# Patient Record
Sex: Female | Born: 1960
Health system: Southern US, Community
[De-identification: ages and names within clinical notes are randomized; demographics above are authoritative.]

## PROBLEM LIST (undated history)

## (undated) DIAGNOSIS — M1990 Unspecified osteoarthritis, unspecified site: Secondary | ICD-10-CM

## (undated) DIAGNOSIS — K219 Gastro-esophageal reflux disease without esophagitis: Secondary | ICD-10-CM

## (undated) DIAGNOSIS — G4730 Sleep apnea, unspecified: Secondary | ICD-10-CM

## (undated) DIAGNOSIS — F32A Depression, unspecified: Secondary | ICD-10-CM

## (undated) DIAGNOSIS — I10 Essential (primary) hypertension: Secondary | ICD-10-CM

## (undated) DIAGNOSIS — Z9889 Other specified postprocedural states: Secondary | ICD-10-CM

## (undated) DIAGNOSIS — R112 Nausea with vomiting, unspecified: Secondary | ICD-10-CM

## (undated) DIAGNOSIS — F329 Major depressive disorder, single episode, unspecified: Secondary | ICD-10-CM

## (undated) DIAGNOSIS — F319 Bipolar disorder, unspecified: Secondary | ICD-10-CM

## (undated) HISTORY — PX: TUBAL LIGATION: SHX77

---

## 2000-04-18 ENCOUNTER — Other Ambulatory Visit: Admission: RE | Admit: 2000-04-18 | Discharge: 2000-04-18 | Payer: Self-pay | Admitting: Internal Medicine

## 2001-04-22 ENCOUNTER — Other Ambulatory Visit: Admission: RE | Admit: 2001-04-22 | Discharge: 2001-04-22 | Payer: Self-pay | Admitting: Internal Medicine

## 2006-09-27 ENCOUNTER — Other Ambulatory Visit: Admission: RE | Admit: 2006-09-27 | Discharge: 2006-09-27 | Payer: Self-pay | Admitting: Internal Medicine

## 2006-10-02 ENCOUNTER — Encounter: Admission: RE | Admit: 2006-10-02 | Discharge: 2006-10-02 | Payer: Self-pay | Admitting: Internal Medicine

## 2006-10-09 ENCOUNTER — Encounter: Admission: RE | Admit: 2006-10-09 | Discharge: 2006-10-09 | Payer: Self-pay | Admitting: Internal Medicine

## 2007-04-05 ENCOUNTER — Encounter: Admission: RE | Admit: 2007-04-05 | Discharge: 2007-04-05 | Payer: Self-pay | Admitting: Internal Medicine

## 2007-10-07 ENCOUNTER — Encounter: Admission: RE | Admit: 2007-10-07 | Discharge: 2007-10-07 | Payer: Self-pay | Admitting: Internal Medicine

## 2007-11-26 ENCOUNTER — Other Ambulatory Visit: Admission: RE | Admit: 2007-11-26 | Discharge: 2007-11-26 | Payer: Self-pay | Admitting: Internal Medicine

## 2011-09-10 ENCOUNTER — Emergency Department (HOSPITAL_COMMUNITY)
Admission: EM | Admit: 2011-09-10 | Discharge: 2011-09-10 | Disposition: A | Discharge status: Home / Self Care | Payer: PRIVATE HEALTH INSURANCE | Attending: Emergency Medicine | Admitting: Emergency Medicine

## 2011-09-10 ENCOUNTER — Encounter (HOSPITAL_COMMUNITY): Payer: Self-pay | Admitting: *Deleted

## 2011-09-10 DIAGNOSIS — F329 Major depressive disorder, single episode, unspecified: Secondary | ICD-10-CM | POA: Insufficient documentation

## 2011-09-10 DIAGNOSIS — Z046 Encounter for general psychiatric examination, requested by authority: Secondary | ICD-10-CM | POA: Insufficient documentation

## 2011-09-10 DIAGNOSIS — F3289 Other specified depressive episodes: Secondary | ICD-10-CM | POA: Insufficient documentation

## 2011-09-10 DIAGNOSIS — I10 Essential (primary) hypertension: Secondary | ICD-10-CM | POA: Insufficient documentation

## 2011-09-10 DIAGNOSIS — F32A Depression, unspecified: Secondary | ICD-10-CM

## 2011-09-10 HISTORY — DX: Essential (primary) hypertension: I10

## 2011-09-10 HISTORY — DX: Depression, unspecified: F32.A

## 2011-09-10 HISTORY — DX: Major depressive disorder, single episode, unspecified: F32.9

## 2011-09-10 LAB — COMPREHENSIVE METABOLIC PANEL
ALT: 17 U/L (ref 0–35)
Albumin: 3.9 g/dL (ref 3.5–5.2)
Calcium: 9.7 mg/dL (ref 8.4–10.5)
Chloride: 100 mEq/L (ref 96–112)
Creatinine, Ser: 0.83 mg/dL (ref 0.50–1.10)
GFR calc Af Amer: >90 mL/min (ref >90)
Glucose, Bld: 112 mg/dL — ABNORMAL HIGH (ref 70–99)
Potassium: 4 mEq/L (ref 3.5–5.1)
Sodium: 135 mEq/L (ref 135–145)
Total Bilirubin: 0.1 mg/dL — ABNORMAL LOW (ref 0.3–1.2)
Total Protein: 7.3 g/dL (ref 6.0–8.3)

## 2011-09-10 LAB — RAPID URINE DRUG SCREEN, HOSP PERFORMED
Benzodiazepines: POSITIVE — AB
Cocaine: NOT DETECTED

## 2011-09-10 LAB — CBC
MCHC: 33.6 g/dL (ref 30.0–36.0)
Platelets: 324 10*3/uL (ref 150–400)

## 2011-09-10 LAB — PREGNANCY, URINE: Preg Test, Ur: NEGATIVE

## 2011-09-10 MED ORDER — METOPROLOL SUCCINATE ER 100 MG PO TB24
100.0000 mg | ORAL_TABLET | Freq: Every day | ORAL | Status: DC
  Filled 2011-09-10: qty 1

## 2011-09-10 MED ORDER — HYDROCHLOROTHIAZIDE 25 MG PO TABS
12.5000 mg | ORAL_TABLET | Freq: Every day | ORAL | Status: DC
  Filled 2011-09-10: qty 0.5

## 2011-09-10 MED ORDER — CARBAMAZEPINE ER 200 MG PO CP12
800.0000 mg | ORAL_CAPSULE | Freq: Every day | ORAL | Status: DC
  Filled 2011-09-10 (×2): qty 4

## 2011-09-10 MED ORDER — ACETAMINOPHEN 325 MG PO TABS: 650.0000 mg | ORAL_TABLET | ORAL | Status: DC | PRN

## 2011-09-10 MED ORDER — ATORVASTATIN CALCIUM 40 MG PO TABS
40.0000 mg | ORAL_TABLET | Freq: Every day | ORAL | Status: DC
  Filled 2011-09-10: qty 1

## 2011-09-10 MED ORDER — BUPROPION HCL ER (XL) 150 MG PO TB24
150.0000 mg | ORAL_TABLET | Freq: Every day | ORAL | Status: DC
  Filled 2011-09-10: qty 1

## 2011-09-10 MED ORDER — LAMOTRIGINE 100 MG PO TABS
100.0000 mg | ORAL_TABLET | Freq: Every day | ORAL | Status: DC
  Filled 2011-09-10: qty 1

## 2011-09-10 MED ORDER — BUPROPION HCL 100 MG PO TABS
100.0000 mg | ORAL_TABLET | Freq: Three times a day (TID) | ORAL | Status: DC
  Filled 2011-09-10 (×3): qty 1

## 2011-09-10 MED ORDER — LITHIUM CARBONATE 300 MG PO CAPS: 300.0000 mg | ORAL_CAPSULE | Freq: Three times a day (TID) | ORAL | Status: DC

## 2011-09-10 MED ORDER — BUPROPION HCL ER (SMOKING DET) 150 MG PO TB12: 150.0000 mg | ORAL_TABLET | Freq: Every morning | ORAL | Status: DC

## 2011-09-10 MED ORDER — LORAZEPAM 1 MG PO TABS: 1.0000 mg | ORAL_TABLET | Freq: Three times a day (TID) | ORAL | Status: DC | PRN

## 2011-09-10 MED ORDER — OLANZAPINE-FLUOXETINE HCL 12-25 MG PO CAPS
1.0000 | ORAL_CAPSULE | Freq: Every evening | ORAL | Status: DC
  Filled 2011-09-10 (×2): qty 1

## 2011-09-10 MED ORDER — IBUPROFEN 200 MG PO TABS
600.0000 mg | ORAL_TABLET | Freq: Three times a day (TID) | ORAL | Status: DC | PRN
  Administered 2011-09-10 (×2): 600 mg via ORAL
  Filled 2011-09-10 (×2): qty 3

## 2011-09-10 MED ORDER — LITHIUM CARBONATE ER 450 MG PO TBCR
450.0000 mg | EXTENDED_RELEASE_TABLET | Freq: Every day | ORAL | Status: DC
  Filled 2011-09-10 (×2): qty 1

## 2011-09-10 MED ORDER — ALUM & MAG HYDROXIDE-SIMETH 200-200-20 MG/5ML PO SUSP: 30.0000 mL | ORAL | Status: DC | PRN

## 2011-09-10 MED ORDER — LAMOTRIGINE 200 MG PO TABS
200.0000 mg | ORAL_TABLET | Freq: Every day | ORAL | Status: DC
  Filled 2011-09-10: qty 1

## 2011-09-10 MED ORDER — ZOLPIDEM TARTRATE 5 MG PO TABS: 5.0000 mg | ORAL_TABLET | Freq: Every evening | ORAL | Status: DC | PRN

## 2011-09-10 MED ORDER — ALPRAZOLAM 0.25 MG PO TABS
0.2500 mg | ORAL_TABLET | Freq: Three times a day (TID) | ORAL | Status: DC
  Administered 2011-09-10: 0.25 mg via ORAL
  Filled 2011-09-10: qty 1

## 2011-09-10 MED ORDER — ALPRAZOLAM 1 MG PO TABS
1.0000 mg | ORAL_TABLET | Freq: Every day | ORAL | Status: DC
  Administered 2011-09-10 (×2): 1 mg via ORAL
  Filled 2011-09-10 (×2): qty 1

## 2011-09-10 MED ORDER — LAMOTRIGINE 100 MG PO TABS: 100.0000 mg | ORAL_TABLET | Freq: Two times a day (BID) | ORAL | Status: DC

## 2011-09-10 MED ORDER — METOPROLOL SUCCINATE ER 25 MG PO TB24
25.0000 mg | ORAL_TABLET | Freq: Every day | ORAL | Status: DC
  Filled 2011-09-10: qty 1

## 2011-09-10 MED ORDER — ONDANSETRON HCL 4 MG PO TABS: 4.0000 mg | ORAL_TABLET | Freq: Three times a day (TID) | ORAL | Status: DC | PRN

## 2011-09-10 NOTE — ED Notes (Signed)
info faxed to Tele Pyche-phone call made to tele psyche

## 2011-09-10 NOTE — ED Notes (Signed)
Tele Psyche completed
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
## 2011-09-10 NOTE — ED Notes (Signed)
Per pt husband. Pt has had issues with depression for a few weeks. Pt medications have recently be changed several times in the past few weeks. Lithium has been added as of yesterday.  Pt has been continuously crying this AM.  Pt sees MD Evelene Croon. Pt reports SI and denies HI. Pt has been through [facility] in 1992.

## 2011-09-10 NOTE — ED Notes (Signed)
Discharged home in no distress. Denies pain. VSS. Husband here to transport home. Dr. Felix Pacini assessed prior to discharge.

## 2011-09-10 NOTE — ED Provider Notes (Addendum)
History     CSN: [id number]  Arrival date & time 09/10/11  2130   First MD Initiated Contact with Patient 09/10/11 [phone]      Chief Complaint  Patient presents with  . V70.1    (Consider location/radiation/quality/duration/timing/severity/associated sxs/prior treatment) HPI Comments: Patient presents with a history of bipolar disorder and depression.  She previously been well controlled on her medication regimen until a few months ago."  (Having increased problems with concentrating at work to the point that even with medication changes her doctor put her on disability.  Since that time patient has been at home and her depression has been worsening.  The husband notes that specifically over the last 3-4 weeks she's become tearful and cries every day.  She's afraid to be alone in the house.  She notes that there is some exacerbation because her youngest son had recently moved out.  She is now beginning of suicidal thoughts but has no specific plans.  She's not become violent or aggressive.  They have been actively in contact with her psychiatrist who has been adjusting her medications over the last few weeks.  They had done a trial of Seroquel without significant changes the patient had only worsened.  Just yesterday the patient was started on lithium and this is a new medication for her.  This morning he got up to go to church the patient cannot stop crying and started having feelings that she might want to end things and her husband brought her here.  Patient is a 51 y.o. female presenting with mental health disorder. The history is provided by the patient and the spouse. No language interpreter was used.  Mental Health Problem The primary symptoms include dysphoric mood. The primary symptoms do not include delusions, hallucinations, bizarre behavior or disorganized speech. The current episode started more than 2 weeks ago. This is a chronic problem.  The degree of incapacity that she is  experiencing as a consequence of her illness is severe. Sequelae of the illness include an inability to work. Additional symptoms of the illness include anhedonia. Additional symptoms of the illness do not include no headaches or no abdominal pain.    Past Medical History  Diagnosis Date  . Hypertension   . Depression     Past Surgical History  Procedure Date  . Tubal ligation     History reviewed. No pertinent family history.  History  Substance Use Topics  . Smoking status: Never Smoker   . Smokeless tobacco: Not on file  . Alcohol Use: No    OB History    Grav Para Term Preterm Abortions TAB SAB Ect Mult Living                  Review of Systems  Constitutional: Negative.  Negative for fever and chills.  HENT: Negative.   Eyes: Negative.  Negative for discharge and redness.  Respiratory: Negative.  Negative for cough and shortness of breath.   Cardiovascular: Negative.  Negative for chest pain.  Gastrointestinal: Negative.  Negative for nausea, vomiting, abdominal pain and diarrhea.  Genitourinary: Negative.  Negative for dysuria and vaginal discharge.  Musculoskeletal: Negative.  Negative for back pain.  Skin: Negative.  Negative for color change and rash.  Neurological: Negative.  Negative for syncope and headaches.  Hematological: Negative.  Negative for adenopathy.  Psychiatric/Behavioral: Positive for dysphoric mood. Negative for hallucinations and confusion.  All other systems reviewed and are negative.    Allergies  Review of patient's allergies  indicates no known allergies.  Home Medications   Current Outpatient Rx  Name Route Sig Dispense Refill  . ALPRAZOLAM 0.25 MG PO TABS Oral Take 0.25 mg by mouth 3 (three) times daily. Takes 1 tablet every morning 2 at bedtime    . BUPROPION HCL 100 MG PO TABS Oral Take 100 mg by mouth 3 (three) times daily.    Marland Kitchen HYDROCHLOROTHIAZIDE 25 MG PO TABS Oral Take 12.5 mg by mouth daily.    Marland Kitchen LAMOTRIGINE 200 MG PO TABS  Oral Take 100-200 mg by mouth 2 (two) times daily. 1/2 tablet every morning and 1 tablet every evening    . LITHIUM CARBONATE 300 MG PO CAPS Oral Take 300 mg by mouth 3 (three) times daily with meals.    Marland Kitchen METOPROLOL SUCCINATE ER 100 MG PO TB24 Oral Take 100 mg by mouth daily. Take with or immediately following a meal.      BP 126/95  Pulse 98  Temp(Src) 98 F (36.7 C) (Oral)  Resp 20  SpO2 99%  LMP 09/08/2011  Physical Exam  Nursing note and vitals reviewed. Constitutional: She is oriented to person, place, and time. She appears well-developed and well-nourished.  Non-toxic appearance. She does not have a sickly appearance.  HENT:  Head: Normocephalic and atraumatic.  Eyes: Conjunctivae, EOM and lids are normal. Pupils are equal, round, and reactive to light. No scleral icterus.  Neck: Trachea normal and normal range of motion. Neck supple.  Cardiovascular: Normal rate, regular rhythm and normal heart sounds.   Pulmonary/Chest: Effort normal and breath sounds normal. No respiratory distress. She has no wheezes. She has no rales.  Abdominal: Soft. Normal appearance. There is no tenderness. There is no rebound, no guarding and no CVA tenderness.  Musculoskeletal: Normal range of motion.  Neurological: She is alert and oriented to person, place, and time. She has normal strength.  Skin: Skin is warm, dry and intact. No rash noted.  Psychiatric: Her behavior is normal. Judgment and thought content normal.       Tearful, depressed mood/affect    ED Course  Procedures (including critical care time)  Results for orders placed during the hospital encounter of 09/10/11  CBC      Component Value Range   WBC 9.6  4.0 - 10.5 (K/uL)   RBC 4.83  3.87 - 5.11 (MIL/uL)   Hemoglobin 13.5  12.0 - 15.0 (g/dL)   HCT 16.1  09.6 - 04.5 (%)   MCV 83.2  78.0 - 100.0 (fL)   MCH 28.0  26.0 - 34.0 (pg)   MCHC 33.6  30.0 - 36.0 (g/dL)   RDW 40.9  81.1 - 91.4 (%)   Platelets 324  150 - 400 (K/uL)    COMPREHENSIVE METABOLIC PANEL      Component Value Range   Sodium 135  135 - 145 (mEq/L)   Potassium 4.0  3.5 - 5.1 (mEq/L)   Chloride 100  96 - 112 (mEq/L)   CO2 22  19 - 32 (mEq/L)   Glucose, Bld 112 (*) 70 - 99 (mg/dL)   BUN 18  6 - 23 (mg/dL)   Creatinine, Ser 7.82  0.50 - 1.10 (mg/dL)   Calcium 9.7  8.4 - 95.6 (mg/dL)   Total Protein 7.3  6.0 - 8.3 (g/dL)   Albumin 3.9  3.5 - 5.2 (g/dL)   AST 17  0 - 37 (U/L)   ALT 17  0 - 35 (U/L)   Alkaline Phosphatase 101  39 - 117 (  U/L)   Total Bilirubin 0.1 (*) 0.3 - 1.2 (mg/dL)   GFR calc non Af Amer 81 (*) >90 (mL/min)   GFR calc Af Amer >90  >90 (mL/min)  ETHANOL      Component Value Range   Alcohol, Ethyl (B) <11  0 - 11 (mg/dL)  URINE RAPID DRUG SCREEN (HOSP PERFORMED)      Component Value Range   Opiates NONE DETECTED  NONE DETECTED    Cocaine NONE DETECTED  NONE DETECTED    Benzodiazepines POSITIVE (*) NONE DETECTED    Amphetamines NONE DETECTED  NONE DETECTED    Tetrahydrocannabinol NONE DETECTED  NONE DETECTED    Barbiturates NONE DETECTED  NONE DETECTED   PREGNANCY, URINE      Component Value Range   Preg Test, Ur NEGATIVE  NEGATIVE   LITHIUM LEVEL      Component Value Range   Lithium Lvl <0.25 (*) 0.80 - 1.40 (mEq/L)      MDM  Patient with significant depression symptoms that are worsening and will likely require inpatient admission for better control of her symptoms as she starting to have suicidal thoughts but no specific plans.  I will contact the ACT team for further evaluation of this patient.        Nat Christen, MD 09/10/11 [phone]  Labs are all normal.  Low lithium expected since pt just started it yesterday.  Pt medically cleared for psych eval.    Nat Christen, MD 09/10/11 1003

## 2011-09-10 NOTE — ED Provider Notes (Signed)
Patient interviewed by specialist on call psychiatrist Dr.Horvath who feels the patient is stable from a psychiatric standpoint and does not require psychiatric admission. At 9 PM I interview patient. She is alert pleasant cooperative vehemently denies want to harm self or others. Plan she is to followup with her private psychiatrist and continue her current medication regimen  Doug Sou, MD 09/10/11 2115

## 2011-09-10 NOTE — Discharge Instructions (Signed)
Depression You have signs of depression. This is a common problem. It can occur at any age. It is often hard to recognize. People can suffer from depression and still have moments of enjoyment. Depression interferes with your basic ability to function in life. It upsets your relationships, sleep, eating, and work habits. CAUSES  Depression is believed to be caused by an imbalance in brain chemicals. It may be triggered by an unpleasant event. Relationship crises, a death in the family, financial worries, retirement, or other stressors are normal causes of depression. Depression may also start for no known reason. Other factors that may play a part include medical illnesses, some medicines, genetics, and alcohol or drug abuse. SYMPTOMS   Feeling unhappy or worthless.   Long-lasting (chronic) tiredness or worn-out feeling.   Self-destructive thoughts and actions.   Not being able to sleep or sleeping too much.   Eating more than usual or not eating at all.   Headaches or feeling anxious.   Trouble concentrating or making decisions.   Unexplained physical problems and substance abuse.  TREATMENT  Depression usually gets better with treatment. This can include:  Antidepressant medicines. It can take weeks before the proper dose is achieved and benefits are reached.   Talking with a therapist, clergyperson, counselor, or friend. These people can help you gain insight into your problem and regain control of your life.   Eating a good diet.   Getting regular physical exercise, such as walking for 30 minutes every day.   Not abusing alcohol or drugs.  Treating depression often takes 6 months or longer. This length of treatment is needed to keep symptoms from returning. Call your caregiver and arrange for follow-up care as suggested. SEEK IMMEDIATE MEDICAL CARE IF:   You start to have thoughts of hurting yourself or others.   Call your local emergency services (911 in [place]   Go to  your local medical emergency department.   Call the National Suicide Prevention Lifeline: 1-800-273-TALK 3403318701).  Document Released: 05/29/2005 Document Revised: 05/18/2011 Document Reviewed: 10/29/2009 [facility] Patient Information 2012 [place], [place].   Continue your current medications. If you have any thought of harming herself or others call 911 immediately. Call your psychiatrist tomorrow for the next available appointment

## 2014-06-12 HISTORY — PX: CERVICAL BIOPSY  W/ LOOP ELECTRODE EXCISION: SUR135

## 2016-07-05 DIAGNOSIS — E7800 Pure hypercholesterolemia, unspecified: Secondary | ICD-10-CM | POA: Diagnosis not present

## 2016-07-05 DIAGNOSIS — Z79899 Other long term (current) drug therapy: Secondary | ICD-10-CM | POA: Diagnosis not present

## 2016-07-11 DIAGNOSIS — F319 Bipolar disorder, unspecified: Secondary | ICD-10-CM | POA: Diagnosis not present

## 2016-07-11 DIAGNOSIS — E7800 Pure hypercholesterolemia, unspecified: Secondary | ICD-10-CM | POA: Diagnosis not present

## 2016-07-11 DIAGNOSIS — Z79899 Other long term (current) drug therapy: Secondary | ICD-10-CM | POA: Diagnosis not present

## 2016-07-11 DIAGNOSIS — I10 Essential (primary) hypertension: Secondary | ICD-10-CM | POA: Diagnosis not present

## 2016-11-13 DIAGNOSIS — L2084 Intrinsic (allergic) eczema: Secondary | ICD-10-CM | POA: Diagnosis not present

## 2016-11-13 DIAGNOSIS — L719 Rosacea, unspecified: Secondary | ICD-10-CM | POA: Diagnosis not present

## 2016-12-22 DIAGNOSIS — M7062 Trochanteric bursitis, left hip: Secondary | ICD-10-CM | POA: Diagnosis not present

## 2017-01-03 DIAGNOSIS — E7800 Pure hypercholesterolemia, unspecified: Secondary | ICD-10-CM | POA: Diagnosis not present

## 2017-01-03 DIAGNOSIS — Z79899 Other long term (current) drug therapy: Secondary | ICD-10-CM | POA: Diagnosis not present

## 2017-01-09 DIAGNOSIS — I10 Essential (primary) hypertension: Secondary | ICD-10-CM | POA: Diagnosis not present

## 2017-01-09 DIAGNOSIS — F319 Bipolar disorder, unspecified: Secondary | ICD-10-CM | POA: Diagnosis not present

## 2017-01-09 DIAGNOSIS — E7800 Pure hypercholesterolemia, unspecified: Secondary | ICD-10-CM | POA: Diagnosis not present

## 2017-01-09 DIAGNOSIS — Z79899 Other long term (current) drug therapy: Secondary | ICD-10-CM | POA: Diagnosis not present

## 2017-01-24 DIAGNOSIS — J069 Acute upper respiratory infection, unspecified: Secondary | ICD-10-CM | POA: Diagnosis not present

## 2017-01-30 DIAGNOSIS — Z23 Encounter for immunization: Secondary | ICD-10-CM | POA: Diagnosis not present

## 2017-05-02 ENCOUNTER — Ambulatory Visit: Payer: PRIVATE HEALTH INSURANCE | Admitting: Primary Care

## 2017-05-08 ENCOUNTER — Ambulatory Visit (INDEPENDENT_AMBULATORY_CARE_PROVIDER_SITE_OTHER): Payer: Medicare Other | Admitting: Primary Care

## 2017-05-08 ENCOUNTER — Encounter: Payer: Self-pay | Admitting: Primary Care

## 2017-05-08 VITALS — BP 118/78 | HR 78 | Temp 97.9°F | Ht 63.0 in | Wt 226.4 lb

## 2017-05-08 DIAGNOSIS — F319 Bipolar disorder, unspecified: Secondary | ICD-10-CM | POA: Insufficient documentation

## 2017-05-08 DIAGNOSIS — F3178 Bipolar disorder, in full remission, most recent episode mixed: Secondary | ICD-10-CM

## 2017-05-08 DIAGNOSIS — I10 Essential (primary) hypertension: Secondary | ICD-10-CM | POA: Diagnosis not present

## 2017-05-08 DIAGNOSIS — L719 Rosacea, unspecified: Secondary | ICD-10-CM

## 2017-05-08 DIAGNOSIS — E785 Hyperlipidemia, unspecified: Secondary | ICD-10-CM

## 2017-05-08 LAB — BASIC METABOLIC PANEL
BUN: 24 mg/dL — AB (ref 6–23)
CHLORIDE: 100 meq/L (ref 96–112)
CO2: 29 mEq/L (ref 19–32)
CREATININE: 0.95 mg/dL (ref 0.40–1.20)
Calcium: 9.4 mg/dL (ref 8.4–10.5)
GFR: 64.59 mL/min (ref >60.00)
Glucose, Bld: 91 mg/dL (ref 70–99)
POTASSIUM: 4 meq/L (ref 3.5–5.1)
Sodium: 138 mEq/L (ref 135–145)

## 2017-05-08 LAB — LIPID PANEL
Cholesterol: 182 mg/dL (ref 0–200)
HDL: 56.6 mg/dL (ref >39.00)
LDL CALC: 95 mg/dL (ref 0–99)
NONHDL: 125.68
Total CHOL/HDL Ratio: 3
Triglycerides: 153 mg/dL — ABNORMAL HIGH (ref 0.0–149.0)
VLDL: 30.6 mg/dL (ref 0.0–40.0)

## 2017-05-08 NOTE — Progress Notes (Signed)
Subjective:    Patient ID: Katherine Carpenter, female    DOB: 09-07-1960, 56 y.o.   MRN: [id number]  HPI  Katherine Carpenter is a 56 year old female who presents today to establish care and discuss the problems mentioned below. Will obtain old records. She follows with her gynecologist every year for her physical.   1) Bipolar Disorder/ADD: Currently managed on Wellbutrin XL 150 mg, carbamazepine, lamotrigine 200 mg, olanzapine-fluoxetine 12-25 mg, quetiapine XR 200 mg, alprazolam PRN, Adderall XR 20 mg. She is currently following with Dr. Toy Care with psychiatry, follows every 6-9 months.  2) Essential Hypertension: Currently managed on HCTZ 25 mg and metoprolol succinate 25 mg. She was once removed from HCTZ one year ago but she experienced extremity edema so her HCTZ was resumed.   BP Readings from Last 3 Encounters:  05/08/17 118/78  09/10/11 142/94    3) Hyperlipidemia: Currently managed on simvastatin 80 mg. Lipid panel in July 2018 with TC of 188, LDL of 99, Trigs of 138.   4) Rosacea/Eczema: Currently managed on metronidazole 0.75% cream daily and doxycycline 100 mg once daily. Currently following with dermatology once annually.   Review of Systems  Eyes: Negative for visual disturbance.  Respiratory: Negative for shortness of breath.   Cardiovascular: Negative for chest pain.  Skin:       Chronic rosacea, follows with dermatology  Neurological: Negative for dizziness and headaches.  Psychiatric/Behavioral:       Following with psychiatry       Past Medical History:  Diagnosis Date  . Depression   . Hypertension      Social History   Socioeconomic History  . Marital status: Married    Spouse name: Not on file  . Number of children: Not on file  . Years of education: Not on file  . Highest education level: Not on file  Social Needs  . Financial resource strain: Not on file  . Food insecurity - worry: Not on file  . Food insecurity - inability: Not on file  .  Transportation needs - medical: Not on file  . Transportation needs - non-medical: Not on file  Occupational History  . Not on file  Tobacco Use  . Smoking status: Never Smoker  . Smokeless tobacco: Never Used  Substance and Sexual Activity  . Alcohol use: No    Comment: Pt denies  . Drug use: No    Comment: Pt denies  . Sexual activity: Not on file  Other Topics Concern  . Not on file  Social History Narrative  . Not on file    Past Surgical History:  Procedure Laterality Date  . TUBAL LIGATION      Family History  Problem Relation Age of Onset  . Asthma Mother   . Depression Daughter   . Arthritis Maternal Grandmother   . Cancer Maternal Grandmother        lung  . Cancer Paternal Grandmother   . Leukemia Paternal Grandmother     No Known Allergies  Current Outpatient Medications on File Prior to Visit  Medication Sig Dispense Refill  . ALPRAZolam (XANAX) 1 MG tablet Take 1 mg by mouth 5 (five) times daily. Patient may take up to 5 times a day    . amphetamine-dextroamphetamine (ADDERALL XR) 20 MG 24 hr capsule Take 20 mg by mouth daily.    Marland Kitchen buPROPion (WELLBUTRIN XL) 150 MG 24 hr tablet Take 150 mg by mouth daily.    . carbamazepine (EQUETRO) 200  MG CP12 Take 800 mg by mouth at bedtime.    Marland Kitchen doxycycline (VIBRAMYCIN) 100 MG capsule Take 100 mg by mouth daily.    . fluticasone (FLONASE) 50 MCG/ACT nasal spray Place into the nose.    . hydrochlorothiazide (HYDRODIURIL) 25 MG tablet Take 12.5 mg by mouth daily.    Marland Kitchen lamoTRIgine (LAMICTAL) 200 MG tablet Take 100-200 mg by mouth 2 (two) times daily. 1/2 tablet every morning and 1 tablet every evening    . metoprolol succinate (TOPROL-XL) 25 MG 24 hr tablet Take 25 mg by mouth daily.    Marland Kitchen olanzapine-FLUoxetine (SYMBYAX) 12-25 MG per capsule Take 1 capsule by mouth every evening.    Marland Kitchen QUEtiapine (SEROQUEL XR) 200 MG 24 hr tablet Take 1,000 mg by mouth at bedtime. Patient takes 5 tablets by mouth every night at bedtime    .  simvastatin (ZOCOR) 80 MG tablet Take 80 mg by mouth daily.    . Calcium Carbonate-Vitamin D (OYSTER SHELL CALCIUM 500 + D) 500-125 MG-UNIT TABS Take by mouth.    . metroNIDAZOLE (METROCREAM) 0.75 % cream Apply topically.    . Multiple Vitamin (MULTI-VITAMINS) TABS Take by mouth.     No current facility-administered medications on file prior to visit.     BP 118/78   Pulse 78   Temp 97.9 F (36.6 C) (Oral)   Ht 5\' 3"  (1.6 m)   Wt 226 lb 6.4 oz (102.7 kg)   SpO2 95%   BMI 40.10 kg/m    Objective:   Physical Exam  Constitutional: She appears well-nourished.  Neck: Neck supple.  Cardiovascular: Normal rate and regular rhythm.  Pulmonary/Chest: Effort normal and breath sounds normal.  Skin: Skin is warm and dry.  Psychiatric: She has a normal mood and affect.          Assessment & Plan:

## 2017-05-08 NOTE — Assessment & Plan Note (Signed)
Chronic, following with dermatology.

## 2017-05-08 NOTE — Patient Instructions (Signed)
Complete lab work prior to leaving today. I will notify you of your results once received.   Please message me when you are needing refills of your medications.   Follow up in 1 year or sooner if needed.  It was a pleasure to meet you today! Please don't hesitate to call me with any questions. Welcome to [facility]!

## 2017-05-08 NOTE — Assessment & Plan Note (Signed)
Repeat lipids pending, continue simvastatin. Discussed the importance of a healthy diet and regular exercise in order for weight loss, and to reduce the risk of other medical problems.

## 2017-05-08 NOTE — Assessment & Plan Note (Signed)
Stable in the office today, continue HCTZ and Toprol XL. BMP pending.

## 2017-05-08 NOTE — Assessment & Plan Note (Signed)
Following with psychiatry, feels well managed. 

## 2017-08-02 DIAGNOSIS — H60501 Unspecified acute noninfective otitis externa, right ear: Secondary | ICD-10-CM | POA: Diagnosis not present

## 2017-08-22 DIAGNOSIS — L2084 Intrinsic (allergic) eczema: Secondary | ICD-10-CM | POA: Diagnosis not present

## 2017-09-07 DIAGNOSIS — Z6836 Body mass index (BMI) 36.0-36.9, adult: Secondary | ICD-10-CM | POA: Diagnosis not present

## 2017-09-07 DIAGNOSIS — Z124 Encounter for screening for malignant neoplasm of cervix: Secondary | ICD-10-CM | POA: Diagnosis not present

## 2017-09-07 DIAGNOSIS — N958 Other specified menopausal and perimenopausal disorders: Secondary | ICD-10-CM | POA: Diagnosis not present

## 2017-09-07 DIAGNOSIS — Z1231 Encounter for screening mammogram for malignant neoplasm of breast: Secondary | ICD-10-CM | POA: Diagnosis not present

## 2017-09-10 LAB — HM PAP SMEAR: HM Pap smear: NEGATIVE

## 2017-10-08 DIAGNOSIS — F3113 Bipolar disorder, current episode manic without psychotic features, severe: Secondary | ICD-10-CM | POA: Diagnosis not present

## 2017-10-14 ENCOUNTER — Encounter: Payer: Self-pay | Admitting: Primary Care

## 2017-10-14 DIAGNOSIS — I10 Essential (primary) hypertension: Secondary | ICD-10-CM

## 2017-10-15 MED ORDER — METOPROLOL SUCCINATE ER 25 MG PO TB24: 25.0000 mg | ORAL_TABLET | Freq: Every day | ORAL | 1 refills | Status: DC

## 2017-10-15 NOTE — Telephone Encounter (Signed)
Ok to refill? MyChart refill request. Medication have not been prescribed by Anda Kraft. Last seen on 05/08/2017

## 2017-10-22 DIAGNOSIS — F3113 Bipolar disorder, current episode manic without psychotic features, severe: Secondary | ICD-10-CM | POA: Diagnosis not present

## 2017-10-23 ENCOUNTER — Encounter: Payer: Self-pay | Admitting: Primary Care

## 2017-10-23 DIAGNOSIS — R82998 Other abnormal findings in urine: Secondary | ICD-10-CM | POA: Diagnosis not present

## 2017-10-23 DIAGNOSIS — N390 Urinary tract infection, site not specified: Secondary | ICD-10-CM | POA: Diagnosis not present

## 2017-10-23 DIAGNOSIS — N760 Acute vaginitis: Secondary | ICD-10-CM | POA: Diagnosis not present

## 2017-10-23 DIAGNOSIS — R350 Frequency of micturition: Secondary | ICD-10-CM | POA: Diagnosis not present

## 2017-11-12 DIAGNOSIS — F3113 Bipolar disorder, current episode manic without psychotic features, severe: Secondary | ICD-10-CM | POA: Diagnosis not present

## 2017-11-27 ENCOUNTER — Encounter: Payer: Self-pay | Admitting: Primary Care

## 2017-11-27 DIAGNOSIS — F3113 Bipolar disorder, current episode manic without psychotic features, severe: Secondary | ICD-10-CM | POA: Diagnosis not present

## 2017-11-27 DIAGNOSIS — Z23 Encounter for immunization: Secondary | ICD-10-CM

## 2017-11-27 MED ORDER — ZOSTER VAC RECOMB ADJUVANTED 50 MCG/0.5ML IM SUSR: 0.5000 mL | Freq: Once | INTRAMUSCULAR | 1 refills | Status: AC

## 2017-11-30 ENCOUNTER — Encounter: Payer: Self-pay | Admitting: Primary Care

## 2017-12-03 ENCOUNTER — Encounter: Payer: Self-pay | Admitting: Primary Care

## 2017-12-03 ENCOUNTER — Telehealth: Payer: Self-pay | Admitting: Primary Care

## 2017-12-03 MED ORDER — SIMVASTATIN 80 MG PO TABS: 80.0000 mg | ORAL_TABLET | Freq: Every day | ORAL | 1 refills | Status: DC

## 2017-12-03 NOTE — Telephone Encounter (Signed)
Caller name: Vicente Males  Relation to pt: pharmacy tech  Call back number: [phone]  Pharmacy: [id number] - [place], [place] [address] [phone] (Phone) [phone] (Fax)   Reason for call:  Pharmacy wanted to confirm the "high dose increase" for simvastatin (ZOCOR) 80 MG tablet,  please advise

## 2017-12-03 NOTE — Telephone Encounter (Signed)
I spoke with pt and pt got the first shingrix at [place] today. Pt said the pharmacist told her she did not need the rx to be brought back.pt request shingrix rx shredded. I spoke with Katherine Carpenter at [facility] and they do not need a rx for shingrix it is a standing order. shingrix rx sent for shredding.

## 2017-12-03 NOTE — Telephone Encounter (Signed)
Copied from [place] [id number]. Topic: Quick Communication - See Telephone Encounter >> Dec 03, 2017  2:27 PM Conception Chancy, NT wrote: CRM for notification. See Telephone encounter for: 12/03/17.  Patient is calling and states she received her shingle shot today and that it is a script for her at the office to pick it. Patient would like that shredded. Thank you

## 2017-12-04 ENCOUNTER — Encounter: Payer: Self-pay | Admitting: Primary Care

## 2017-12-04 ENCOUNTER — Telehealth: Payer: Self-pay | Admitting: Primary Care

## 2017-12-04 DIAGNOSIS — E785 Hyperlipidemia, unspecified: Secondary | ICD-10-CM

## 2017-12-04 MED ORDER — SIMVASTATIN 10 MG PO TABS
10.0000 mg | ORAL_TABLET | Freq: Every day | ORAL | 1 refills | Status: DC
Start: 2017-12-04 — End: 2017-12-04

## 2017-12-04 MED ORDER — SIMVASTATIN 10 MG PO TABS: 10.0000 mg | ORAL_TABLET | Freq: Every day | ORAL | 0 refills | Status: DC

## 2017-12-04 NOTE — Telephone Encounter (Signed)
Copied from [place] [id number]. Topic: Quick Communication - Rx Refill/Question >> Dec 04, 2017 11:13 AM Margot Ables wrote: Medication: received RX for simvastatin (ZOCOR) 80 MG tablet  - pt was previously taking simvastatin 10mg . Please call to advise if correct dosing.  [id number] [place], [place] - [address] [place] [address] Phone: [phone] Fax: [phone]

## 2017-12-04 NOTE — Telephone Encounter (Signed)
Spoken to patient. She stated that her previous provider started her at 80 mg then 40 mg then 20 and now 10 mg.   Spoken to Allie Bossier, will refill simvastatin 10 mg for 30 days. Allie Bossier recommended a re-check of lipids.

## 2017-12-04 NOTE — Telephone Encounter (Signed)
Patient viewed message on MyChart 

## 2017-12-05 ENCOUNTER — Encounter: Payer: Self-pay | Admitting: Primary Care

## 2017-12-06 ENCOUNTER — Encounter: Payer: Self-pay | Admitting: Primary Care

## 2017-12-07 ENCOUNTER — Encounter: Payer: Self-pay | Admitting: Primary Care

## 2017-12-10 DIAGNOSIS — F3113 Bipolar disorder, current episode manic without psychotic features, severe: Secondary | ICD-10-CM | POA: Diagnosis not present

## 2017-12-12 ENCOUNTER — Other Ambulatory Visit (INDEPENDENT_AMBULATORY_CARE_PROVIDER_SITE_OTHER): Payer: Medicare Other

## 2017-12-12 DIAGNOSIS — E785 Hyperlipidemia, unspecified: Secondary | ICD-10-CM | POA: Diagnosis not present

## 2017-12-12 LAB — LIPID PANEL
CHOLESTEROL: 172 mg/dL (ref 0–200)
HDL: 50.8 mg/dL (ref >39.00)
LDL CALC: 97 mg/dL (ref 0–99)
NonHDL: 121.57
TRIGLYCERIDES: 124 mg/dL (ref 0.0–149.0)
Total CHOL/HDL Ratio: 3
VLDL: 24.8 mg/dL (ref 0.0–40.0)

## 2017-12-12 LAB — COMPREHENSIVE METABOLIC PANEL
ALBUMIN: 4.1 g/dL (ref 3.5–5.2)
ALT: 20 U/L (ref 0–35)
AST: 18 U/L (ref 0–37)
Alkaline Phosphatase: 86 U/L (ref 39–117)
BUN: 25 mg/dL — ABNORMAL HIGH (ref 6–23)
CHLORIDE: 103 meq/L (ref 96–112)
CO2: 29 mEq/L (ref 19–32)
Calcium: 9.3 mg/dL (ref 8.4–10.5)
Creatinine, Ser: 0.9 mg/dL (ref 0.40–1.20)
GFR: 68.6 mL/min (ref >60.00)
Glucose, Bld: 95 mg/dL (ref 70–99)
POTASSIUM: 4.3 meq/L (ref 3.5–5.1)
Sodium: 140 mEq/L (ref 135–145)
Total Bilirubin: 0.2 mg/dL (ref 0.2–1.2)
Total Protein: 7 g/dL (ref 6.0–8.3)

## 2018-01-01 DIAGNOSIS — F3113 Bipolar disorder, current episode manic without psychotic features, severe: Secondary | ICD-10-CM | POA: Diagnosis not present

## 2018-01-15 DIAGNOSIS — F3113 Bipolar disorder, current episode manic without psychotic features, severe: Secondary | ICD-10-CM | POA: Diagnosis not present

## 2018-01-29 DIAGNOSIS — F3113 Bipolar disorder, current episode manic without psychotic features, severe: Secondary | ICD-10-CM | POA: Diagnosis not present

## 2018-02-12 DIAGNOSIS — F3113 Bipolar disorder, current episode manic without psychotic features, severe: Secondary | ICD-10-CM | POA: Diagnosis not present

## 2018-03-07 DIAGNOSIS — Z23 Encounter for immunization: Secondary | ICD-10-CM | POA: Diagnosis not present

## 2018-03-12 DIAGNOSIS — F3113 Bipolar disorder, current episode manic without psychotic features, severe: Secondary | ICD-10-CM | POA: Diagnosis not present

## 2018-03-26 DIAGNOSIS — F3113 Bipolar disorder, current episode manic without psychotic features, severe: Secondary | ICD-10-CM | POA: Diagnosis not present

## 2018-04-16 DIAGNOSIS — F3113 Bipolar disorder, current episode manic without psychotic features, severe: Secondary | ICD-10-CM | POA: Diagnosis not present

## 2018-04-18 ENCOUNTER — Other Ambulatory Visit: Payer: Self-pay | Admitting: Primary Care

## 2018-04-18 DIAGNOSIS — I10 Essential (primary) hypertension: Secondary | ICD-10-CM

## 2018-04-29 ENCOUNTER — Other Ambulatory Visit: Payer: Self-pay | Admitting: Primary Care

## 2018-04-29 DIAGNOSIS — E785 Hyperlipidemia, unspecified: Secondary | ICD-10-CM

## 2018-04-29 DIAGNOSIS — Z1159 Encounter for screening for other viral diseases: Secondary | ICD-10-CM

## 2018-04-29 DIAGNOSIS — I10 Essential (primary) hypertension: Secondary | ICD-10-CM

## 2018-05-06 ENCOUNTER — Other Ambulatory Visit (INDEPENDENT_AMBULATORY_CARE_PROVIDER_SITE_OTHER): Payer: Medicare Other

## 2018-05-06 DIAGNOSIS — I10 Essential (primary) hypertension: Secondary | ICD-10-CM

## 2018-05-06 DIAGNOSIS — E785 Hyperlipidemia, unspecified: Secondary | ICD-10-CM

## 2018-05-06 DIAGNOSIS — Z1159 Encounter for screening for other viral diseases: Secondary | ICD-10-CM | POA: Diagnosis not present

## 2018-05-06 LAB — LIPID PANEL
CHOLESTEROL: 180 mg/dL (ref 0–200)
HDL: 56.8 mg/dL (ref >39.00)
LDL Cholesterol: 94 mg/dL (ref 0–99)
NonHDL: 122.99
Total CHOL/HDL Ratio: 3
Triglycerides: 145 mg/dL (ref 0.0–149.0)
VLDL: 29 mg/dL (ref 0.0–40.0)

## 2018-05-06 LAB — BASIC METABOLIC PANEL
BUN: 24 mg/dL — ABNORMAL HIGH (ref 6–23)
CO2: 26 meq/L (ref 19–32)
Calcium: 9.4 mg/dL (ref 8.4–10.5)
Chloride: 103 mEq/L (ref 96–112)
Creatinine, Ser: 0.97 mg/dL (ref 0.40–1.20)
GFR: 62.83 mL/min (ref >60.00)
Glucose, Bld: 91 mg/dL (ref 70–99)
POTASSIUM: 4.5 meq/L (ref 3.5–5.1)
SODIUM: 138 meq/L (ref 135–145)

## 2018-05-07 DIAGNOSIS — F3113 Bipolar disorder, current episode manic without psychotic features, severe: Secondary | ICD-10-CM | POA: Diagnosis not present

## 2018-05-07 LAB — HEPATITIS C ANTIBODY
HEP C AB: NONREACTIVE
SIGNAL TO CUT-OFF: 0.02 (ref <1.00)

## 2018-05-13 ENCOUNTER — Encounter: Payer: Self-pay | Admitting: *Deleted

## 2018-05-13 ENCOUNTER — Ambulatory Visit (INDEPENDENT_AMBULATORY_CARE_PROVIDER_SITE_OTHER): Payer: Medicare Other | Admitting: Primary Care

## 2018-05-13 ENCOUNTER — Encounter: Payer: Self-pay | Admitting: Primary Care

## 2018-05-13 VITALS — BP 118/76 | HR 86 | Temp 98.2°F | Ht 63.0 in | Wt 214.8 lb

## 2018-05-13 DIAGNOSIS — F3178 Bipolar disorder, in full remission, most recent episode mixed: Secondary | ICD-10-CM | POA: Diagnosis not present

## 2018-05-13 DIAGNOSIS — L719 Rosacea, unspecified: Secondary | ICD-10-CM | POA: Diagnosis not present

## 2018-05-13 DIAGNOSIS — E785 Hyperlipidemia, unspecified: Secondary | ICD-10-CM | POA: Diagnosis not present

## 2018-05-13 DIAGNOSIS — Z0000 Encounter for general adult medical examination without abnormal findings: Secondary | ICD-10-CM

## 2018-05-13 DIAGNOSIS — I10 Essential (primary) hypertension: Secondary | ICD-10-CM | POA: Diagnosis not present

## 2018-05-13 NOTE — Assessment & Plan Note (Signed)
Recent lipid panel stable, continue simvastatin.

## 2018-05-13 NOTE — Patient Instructions (Signed)
Start exercising. You should be getting 150 minutes of moderate intensity exercise weekly.  It's important to improve your diet by reducing consumption of fast food, fried food, processed snack foods, sugary drinks. Increase consumption of fresh vegetables and fruits, whole grains, water.  Ensure you are drinking 64 ounces of water daily.  We will see you in one year for your annual exam or sooner if needed.  It was a pleasure to see you today!

## 2018-05-13 NOTE — Assessment & Plan Note (Signed)
Immunizations UTD.  Pap smear and mammogram UTD per patient, follows with GYN. Colonoscopy UTD. Discussed the importance of a healthy diet and regular exercise in order for weight loss, and to reduce the risk of any potential medical problems. Exam unremarkable. Labs reviewed. Follow up in 1 year for [place].  I have personally reviewed and have noted: 1. The patient's medical and social history 2. Their use of alcohol, tobacco or illicit drugs 3. Their current medications and supplements 4. The patient's functional ability including ADL's, fall risks, home safety risks and hearing or visual impairment. 5. Diet and physical activities 6. Evidence for depression or mood disorder

## 2018-05-13 NOTE — Assessment & Plan Note (Signed)
Doing well on current regimen, following with psychiatry. Denies SI/HI.

## 2018-05-13 NOTE — Assessment & Plan Note (Signed)
Stable in the office today, continue current regimen.  BMP unremarkable.

## 2018-05-13 NOTE — Assessment & Plan Note (Signed)
Doing well on current regimen of Doxycycline and metronidazole. Following with dermatology.

## 2018-05-13 NOTE — Progress Notes (Signed)
Patient ID: Katherine Carpenter, female   DOB: 04-18-1961, 57 y.o.   MRN: [id number]   HPI:  Past Medical History:  Diagnosis Date  . Depression   . Hypertension     Current Outpatient Medications  Medication Sig Dispense Refill  . ALPRAZolam (XANAX) 1 MG tablet Take 1 mg by mouth 5 (five) times daily. Patient may take up to 5 times a day    . amphetamine-dextroamphetamine (ADDERALL XR) 20 MG 24 hr capsule Take 20 mg by mouth daily.    Marland Kitchen buPROPion (WELLBUTRIN XL) 150 MG 24 hr tablet Take 150 mg by mouth daily.    . Calcium Carbonate-Vitamin D (OYSTER SHELL CALCIUM 500 + D) 500-125 MG-UNIT TABS Take by mouth.    . carbamazepine (EQUETRO) 200 MG CP12 Take 800 mg by mouth at bedtime.    Marland Kitchen doxycycline (VIBRAMYCIN) 100 MG capsule Take 100 mg by mouth daily.    . hydrochlorothiazide (HYDRODIURIL) 25 MG tablet Take 12.5 mg by mouth daily.    Marland Kitchen lamoTRIgine (LAMICTAL) 200 MG tablet Take 100-200 mg by mouth 2 (two) times daily. 1/2 tablet every morning and 1 tablet every evening    . metoprolol succinate (TOPROL-XL) 25 MG 24 hr tablet TAKE 1 TABLET BY MOUTH EVERY DAY 90 tablet 0  . metroNIDAZOLE (METROCREAM) 0.75 % cream Apply topically.    . Multiple Vitamin (MULTI-VITAMINS) TABS Take by mouth.    . olanzapine-FLUoxetine (SYMBYAX) 12-25 MG per capsule Take 1 capsule by mouth every evening.    Marland Kitchen QUEtiapine (SEROQUEL XR) 200 MG 24 hr tablet Take 1,000 mg by mouth at bedtime. Patient takes 5 tablets by mouth every night at bedtime    . simvastatin (ZOCOR) 10 MG tablet Take 1 tablet (10 mg total) by mouth daily at 6 PM. WILL NEED APPOINTMENT FOR A RE-CHECK 30 tablet 0  . LATUDA 60 MG TABS Take 1 tablet by mouth daily.  4   No current facility-administered medications for this visit.     No Known Allergies  Family History  Problem Relation Age of Onset  . Asthma Mother   . Depression Daughter   . Arthritis Maternal Grandmother   . Cancer Maternal Grandmother        lung  . Cancer Paternal  Grandmother   . Leukemia Paternal Grandmother     Social History   Socioeconomic History  . Marital status: Married    Spouse name: Not on file  . Number of children: Not on file  . Years of education: Not on file  . Highest education level: Not on file  Occupational History  . Not on file  Social Needs  . Financial resource strain: Not on file  . Food insecurity:    Worry: Not on file    Inability: Not on file  . Transportation needs:    Medical: Not on file    Non-medical: Not on file  Tobacco Use  . Smoking status: Never Smoker  . Smokeless tobacco: Never Used  Substance and Sexual Activity  . Alcohol use: No    Comment: Pt denies  . Drug use: No    Comment: Pt denies  . Sexual activity: Not on file  Lifestyle  . Physical activity:    Days per week: Not on file    Minutes per session: Not on file  . Stress: Not on file  Relationships  . Social connections:    Talks on phone: Not on file    Gets together: Not on file  Attends religious service: Not on file    Active member of club or organization: Not on file    Attends meetings of clubs or organizations: Not on file    Relationship status: Not on file  . Intimate partner violence:    Fear of current or ex partner: Not on file    Emotionally abused: Not on file    Physically abused: Not on file    Forced sexual activity: Not on file  Other Topics Concern  . Not on file  Social History Narrative  . Not on file    Hospitiliaztions:  Health Maintenance:    Flu: Compelted  Tetanus: Completed in 2014  Shingles: Completed Shingrix  Bone Density: Completed last year  Colonoscopy: Completed in 2013  Eye Doctor: Completed in 2018  Dental Exam: Completes semi-annually   Mammogram: Completed in 2018  Pap: Completes every other year, last one in 2018  Hep C Screening: Negative    Providers: Dr. Corinna Capra, GYN; Alma Friendly, PCP; [facility], Dermatologist; Dr. Buddy Duty, psychiatry   I have personally  reviewed and have noted: 1. The patient's medical and social history 2. Their use of alcohol, tobacco or illicit drugs 3. Their current medications and supplements 4. The patient's functional ability including ADL's, fall risks, home safety risks  and hearing or visual impairment. 5. Diet and physical activities 6. Evidence for depression or mood disorder  Subjective:   Review of Systems:   Constitutional: Denies fever, malaise, fatigue, headache or abrupt weight changes.  HEENT: Denies eye pain, eye redness, ear pain, ringing in the ears, wax buildup, runny nose, nasal congestion, bloody nose, or sore throat. Respiratory: Denies difficulty breathing, shortness of breath, cough or sputum production.   Cardiovascular: Denies chest pain, chest tightness, palpitations or swelling in the hands or feet.  Gastrointestinal: Denies abdominal pain, bloating, constipation, diarrhea or blood in the stool.  GU: Denies urgency, frequency, pain with urination, burning sensation, blood in urine, odor or discharge. Musculoskeletal: Denies decrease in range of motion, difficulty with gait, muscle pain or joint pain and swelling.  Skin: Denies redness, rashes, lesions or ulcercations. Intermittent eczema flares with stress.  Neurological: Denies dizziness, difficulty with speech or problems with balance and coordination.  Psychiatric: Following with psychiatry for bipolar disorder.   No other specific complaints in a complete review of systems (except as listed in HPI above).  Objective:  PE:   BP 118/76   Pulse 86   Temp 98.2 F (36.8 C) (Oral)   Ht 5\' 3"  (1.6 m)   Wt 214 lb 12 oz (97.4 kg)   LMP 09/08/2011   SpO2 98%   BMI 38.04 kg/m  Wt Readings from Last 3 Encounters:  05/13/18 214 lb 12 oz (97.4 kg)  05/08/17 226 lb 6.4 oz (102.7 kg)    General: Appears their stated age, well developed, well nourished in NAD. Skin: Warm, dry and intact. No rashes, lesions or ulcerations noted. HEENT:  Head: normal shape and size; Eyes: sclera white, no icterus, conjunctiva pink, PERRLA and EOMs intact; Ears: Tm's gray and intact, normal light reflex; Nose: mucosa pink and moist, septum midline; Throat/Mouth: Teeth present, mucosa pink and moist, no exudate, lesions or ulcerations noted.  Neck: Normal range of motion. Neck supple, trachea midline. No massses, lumps or thyromegaly present.  Cardiovascular: Normal rate and rhythm. S1,S2 noted.  No murmur, rubs or gallops noted. No JVD or BLE edema. No carotid bruits noted. Pulmonary/Chest: Normal effort and positive vesicular breath sounds. No respiratory  distress. No wheezes, rales or ronchi noted.  Abdomen: Soft and nontender. Normal bowel sounds, no bruits noted. No distention or masses noted. Liver, spleen and kidneys non palpable. Musculoskeletal: Normal range of motion. No signs of joint swelling. No difficulty with gait.  Neurological: Alert and oriented. Cranial nerves II-XII intact. Coordination normal. +DTRs bilaterally. Psychiatric: Mood and affect normal. Behavior is normal. Judgment and thought content normal.   EKG:  BMET    Component Value Date/Time   NA 138 05/06/2018 0735   K 4.5 05/06/2018 0735   CL 103 05/06/2018 0735   CO2 26 05/06/2018 0735   GLUCOSE 91 05/06/2018 0735   BUN 24 (H) 05/06/2018 0735   CREATININE 0.97 05/06/2018 0735   CALCIUM 9.4 05/06/2018 0735   GFRNONAA 81 (L) 09/10/2011 0840   GFRAA >90 09/10/2011 0840    Lipid Panel     Component Value Date/Time   CHOL 180 05/06/2018 0735   TRIG 145.0 05/06/2018 0735   HDL 56.80 05/06/2018 0735   CHOLHDL 3 05/06/2018 0735   VLDL 29.0 05/06/2018 0735   LDLCALC 94 05/06/2018 0735    CBC    Component Value Date/Time   WBC 9.6 09/10/2011 0840   RBC 4.83 09/10/2011 0840   HGB 13.5 09/10/2011 0840   HCT 40.2 09/10/2011 0840   PLT 324 09/10/2011 0840   MCV 83.2 09/10/2011 0840   MCH 28.0 09/10/2011 0840   MCHC 33.6 09/10/2011 0840   RDW 14.0 09/10/2011  0840    Hgb A1C No results found for: HGBA1C    Assessment and Plan:   Medicare Annual Wellness Visit:  Diet: She endorses a fair diet. Breakfast: Bagel with peanut butter Lunch: Skips sometimes, cookies Dinner: Meat, vegetables  Snacks: Candy bars, cookies, chips, popcorn Desserts: Daily  Beverages: Diet soda, water Physical activity: She is not exercising  Depression/mood screen: Negative Hearing: Intact to whispered voice Visual acuity: Grossly normal, performs annual eye exam  ADLs: Capable Fall risk: None Home safety: Good Cognitive evaluation: Intact to orientation, naming, recall and repetition EOL planning: Adv directives, full code/ I agree  Preventative Medicine: Immunizations UTD.  Pap smear and mammogram UTD per patient, follows with GYN. Colonoscopy UTD. Discussed the importance of a healthy diet and regular exercise in order for weight loss, and to reduce the risk of any potential medical problems. Exam unremarkable. Labs reviewed. Follow up in 1 year for [place].  Next appointment: One year

## 2018-06-19 ENCOUNTER — Other Ambulatory Visit: Payer: Self-pay | Admitting: Primary Care

## 2018-06-20 DIAGNOSIS — F3113 Bipolar disorder, current episode manic without psychotic features, severe: Secondary | ICD-10-CM | POA: Diagnosis not present

## 2018-06-25 DIAGNOSIS — L719 Rosacea, unspecified: Secondary | ICD-10-CM | POA: Diagnosis not present

## 2018-06-25 DIAGNOSIS — L280 Lichen simplex chronicus: Secondary | ICD-10-CM | POA: Diagnosis not present

## 2018-06-25 DIAGNOSIS — Z23 Encounter for immunization: Secondary | ICD-10-CM | POA: Diagnosis not present

## 2018-07-05 ENCOUNTER — Encounter: Payer: Self-pay | Admitting: Primary Care

## 2018-07-05 ENCOUNTER — Ambulatory Visit (INDEPENDENT_AMBULATORY_CARE_PROVIDER_SITE_OTHER): Payer: Medicare Other | Admitting: Primary Care

## 2018-07-05 VITALS — BP 118/84 | HR 88 | Temp 97.9°F | Ht 63.0 in | Wt 218.5 lb

## 2018-07-05 DIAGNOSIS — J029 Acute pharyngitis, unspecified: Secondary | ICD-10-CM | POA: Diagnosis not present

## 2018-07-05 DIAGNOSIS — J309 Allergic rhinitis, unspecified: Secondary | ICD-10-CM

## 2018-07-05 LAB — POCT RAPID STREP A (OFFICE): RAPID STREP A SCREEN: NEGATIVE

## 2018-07-05 NOTE — Patient Instructions (Signed)
Your symptoms are likely due to allergies.  Resume your Zyrtec daily for the next several weeks.  You can try plain Delsym or Robitussin for cough if needed.  Nasal Congestion/Ear Pressure/Sinus Pressure: Try using Flonase (fluticasone) nasal spray. Instill 1 spray in each nostril twice daily.   Please notify me if you develop persistent fevers of 101, notice increased fatigue or weakness, and/or feel worse after 1 week of onset of symptoms.   Increase consumption of water intake and rest.  It was a pleasure to see you today!

## 2018-07-05 NOTE — Addendum Note (Signed)
Addended by: Jacqualin Combes on: 07/05/2018 09:59 AM   Modules accepted: Orders

## 2018-07-05 NOTE — Progress Notes (Signed)
Subjective:    Patient ID: Danella Sensing, female    DOB: 1960-08-25, 58 y.o.   MRN: [id number]  HPI  Ms. Beitzel is a 58 year old female who presents today with a chief complaint of nasal congestion.  She also reports right ear pain, scratchy throat, post nasal drip, mild cough. Her symptoms began 5 days ago. She denies fevers, sick contacts. She's tried taking Alka-Seltzer Cold without much improvement.  Review of Systems  Constitutional: Negative for fatigue and fever.  HENT: Positive for ear pain and postnasal drip. Negative for sinus pressure.        Scratchy throat  Respiratory: Positive for cough. Negative for shortness of breath.        Past Medical History:  Diagnosis Date  . Depression   . Hypertension      Social History   Socioeconomic History  . Marital status: Married    Spouse name: Not on file  . Number of children: Not on file  . Years of education: Not on file  . Highest education level: Not on file  Occupational History  . Not on file  Social Needs  . Financial resource strain: Not on file  . Food insecurity:    Worry: Not on file    Inability: Not on file  . Transportation needs:    Medical: Not on file    Non-medical: Not on file  Tobacco Use  . Smoking status: Never Smoker  . Smokeless tobacco: Never Used  Substance and Sexual Activity  . Alcohol use: No    Comment: Pt denies  . Drug use: No    Comment: Pt denies  . Sexual activity: Not on file  Lifestyle  . Physical activity:    Days per week: Not on file    Minutes per session: Not on file  . Stress: Not on file  Relationships  . Social connections:    Talks on phone: Not on file    Gets together: Not on file    Attends religious service: Not on file    Active member of club or organization: Not on file    Attends meetings of clubs or organizations: Not on file    Relationship status: Not on file  . Intimate partner violence:    Fear of current or ex partner: Not on file   Emotionally abused: Not on file    Physically abused: Not on file    Forced sexual activity: Not on file  Other Topics Concern  . Not on file  Social History Narrative  . Not on file    Past Surgical History:  Procedure Laterality Date  . TUBAL LIGATION      Family History  Problem Relation Age of Onset  . Asthma Mother   . Depression Daughter   . Arthritis Maternal Grandmother   . Cancer Maternal Grandmother        lung  . Cancer Paternal Grandmother   . Leukemia Paternal Grandmother     No Known Allergies  Current Outpatient Medications on File Prior to Visit  Medication Sig Dispense Refill  . ALPRAZolam (XANAX) 1 MG tablet Take 1 mg by mouth 5 (five) times daily. Patient may take up to 5 times a day    . amphetamine-dextroamphetamine (ADDERALL XR) 20 MG 24 hr capsule Take 20 mg by mouth daily.    Marland Kitchen buPROPion (WELLBUTRIN XL) 150 MG 24 hr tablet Take 150 mg by mouth daily.    . Calcium Carbonate-Vitamin D (  OYSTER SHELL CALCIUM 500 + D) 500-125 MG-UNIT TABS Take by mouth.    . carbamazepine (EQUETRO) 200 MG CP12 Take 800 mg by mouth at bedtime.    Marland Kitchen doxycycline (VIBRAMYCIN) 100 MG capsule Take 100 mg by mouth daily.    . hydrochlorothiazide (HYDRODIURIL) 25 MG tablet Take 12.5 mg by mouth daily.    Marland Kitchen lamoTRIgine (LAMICTAL) 200 MG tablet Take 100-200 mg by mouth 2 (two) times daily. 1/2 tablet every morning and 1 tablet every evening    . LATUDA 60 MG TABS Take 1 tablet by mouth daily.  4  . metoprolol succinate (TOPROL-XL) 25 MG 24 hr tablet TAKE 1 TABLET BY MOUTH EVERY DAY 90 tablet 0  . metroNIDAZOLE (METROCREAM) 0.75 % cream Apply topically.    . Multiple Vitamin (MULTI-VITAMINS) TABS Take by mouth.    . olanzapine-FLUoxetine (SYMBYAX) 12-25 MG per capsule Take 1 capsule by mouth every evening.    Marland Kitchen QUEtiapine (SEROQUEL XR) 200 MG 24 hr tablet Take 1,000 mg by mouth at bedtime. Patient takes 5 tablets by mouth every night at bedtime    . simvastatin (ZOCOR) 10 MG tablet  Take 1 tablet (10 mg total) by mouth daily. 90 tablet 1   No current facility-administered medications on file prior to visit.     BP 118/84   Pulse 88   Temp 97.9 F (36.6 C) (Oral)   Ht 5\' 3"  (1.6 m)   Wt 218 lb 8 oz (99.1 kg)   LMP 09/08/2011   SpO2 98%   BMI 38.71 kg/m    Objective:   Physical Exam  Constitutional: She appears well-nourished. She does not appear ill.  HENT:  Right Ear: Tympanic membrane and ear canal normal.  Left Ear: Tympanic membrane and ear canal normal.  Nose: No mucosal edema. Right sinus exhibits no maxillary sinus tenderness and no frontal sinus tenderness. Left sinus exhibits no maxillary sinus tenderness and no frontal sinus tenderness.  Mouth/Throat: Oropharynx is clear and moist.  Neck: Neck supple.  Cardiovascular: Normal rate and regular rhythm.  Respiratory: Effort normal and breath sounds normal. She has no wheezes.  Skin: Skin is warm and dry.           Assessment & Plan:  Allergic Rhinitis:  Post nasal drip, mild cough, scratchy throat x 5 days. Exam today consistent for allergy involvement. Rapid strep: negative Discussed use of antihistamine, nasal steroid spray, Delsym/Robitussin as needed. Fluids, rest, return precautions provided.  Pleas Koch, NP

## 2018-07-11 DIAGNOSIS — F3113 Bipolar disorder, current episode manic without psychotic features, severe: Secondary | ICD-10-CM | POA: Diagnosis not present

## 2018-08-01 ENCOUNTER — Other Ambulatory Visit (HOSPITAL_COMMUNITY)
Admission: RE | Admit: 2018-08-01 | Discharge: 2018-08-01 | Disposition: A | Discharge status: Home / Self Care | Payer: Medicare Other | Source: Ambulatory Visit | Attending: Primary Care | Admitting: Primary Care

## 2018-08-01 ENCOUNTER — Encounter: Payer: Self-pay | Admitting: Primary Care

## 2018-08-01 ENCOUNTER — Ambulatory Visit (INDEPENDENT_AMBULATORY_CARE_PROVIDER_SITE_OTHER): Payer: Medicare Other | Admitting: Primary Care

## 2018-08-01 VITALS — BP 120/80 | HR 95 | Temp 98.1°F | Ht 63.0 in | Wt 223.2 lb

## 2018-08-01 DIAGNOSIS — Z124 Encounter for screening for malignant neoplasm of cervix: Secondary | ICD-10-CM | POA: Insufficient documentation

## 2018-08-01 DIAGNOSIS — Z1239 Encounter for other screening for malignant neoplasm of breast: Secondary | ICD-10-CM

## 2018-08-01 DIAGNOSIS — Z1151 Encounter for screening for human papillomavirus (HPV): Secondary | ICD-10-CM | POA: Diagnosis not present

## 2018-08-01 NOTE — Progress Notes (Signed)
Subjective:    Patient ID: Katherine Carpenter, female    DOB: [birth year], 58 y.o.   MRN: [id number]  HPI  Katherine Carpenter is a 58 year old female who presents today for pap smear and mammogram. [facility] with [facility]   Her last pap smear was at least three years ago. Her last menstrual cycle was 5 years ago. Her last mammogram was one year ago, also had bone density scan last year and endorses it was normal.   She denies vaginal itching, vaginal bleeding, vaginal discharge.   Review of Systems  Constitutional: Negative for unexpected weight change.  Genitourinary: Negative for dysuria, vaginal bleeding and vaginal discharge.  Neurological: Negative for dizziness.       Past Medical History:  Diagnosis Date  . Depression   . Hypertension      Social History   Socioeconomic History  . Marital status: Married    Spouse name: Not on file  . Number of children: Not on file  . Years of education: Not on file  . Highest education level: Not on file  Occupational History  . Not on file  Social Needs  . Financial resource strain: Not on file  . Food insecurity:    Worry: Not on file    Inability: Not on file  . Transportation needs:    Medical: Not on file    Non-medical: Not on file  Tobacco Use  . Smoking status: Never Smoker  . Smokeless tobacco: Never Used  Substance and Sexual Activity  . Alcohol use: No    Comment: Pt denies  . Drug use: No    Comment: Pt denies  . Sexual activity: Not on file  Lifestyle  . Physical activity:    Days per week: Not on file    Minutes per session: Not on file  . Stress: Not on file  Relationships  . Social connections:    Talks on phone: Not on file    Gets together: Not on file    Attends religious service: Not on file    Active member of club or organization: Not on file    Attends meetings of clubs or organizations: Not on file    Relationship status: Not on file  . Intimate partner violence:     Fear of current or ex partner: Not on file    Emotionally abused: Not on file    Physically abused: Not on file    Forced sexual activity: Not on file  Other Topics Concern  . Not on file  Social History Narrative  . Not on file    Past Surgical History:  Procedure Laterality Date  . TUBAL LIGATION      Family History  Problem Relation Age of Onset  . Asthma Mother   . Depression Daughter   . Arthritis Maternal Grandmother   . Cancer Maternal Grandmother        lung  . Cancer Paternal Grandmother   . Leukemia Paternal Grandmother     No Known Allergies  Current Outpatient Medications on File Prior to Visit  Medication Sig Dispense Refill  . ALPRAZolam (XANAX) 1 MG tablet Take 1 mg by mouth 5 (five) times daily. Patient may take up to 5 times a day    . amphetamine-dextroamphetamine (ADDERALL XR) 20 MG 24 hr capsule Take 20 mg by mouth daily.    Marland Kitchen buPROPion (WELLBUTRIN XL) 150 MG 24 hr tablet Take 150 mg by mouth daily.    Marland Kitchen  Calcium Carbonate-Vitamin D (OYSTER SHELL CALCIUM 500 + D) 500-125 MG-UNIT TABS Take by mouth.    . carbamazepine (EQUETRO) 200 MG CP12 Take 800 mg by mouth at bedtime.    Marland Kitchen doxycycline (VIBRAMYCIN) 100 MG capsule Take 100 mg by mouth daily.    . hydrochlorothiazide (HYDRODIURIL) 25 MG tablet Take 12.5 mg by mouth daily.    Marland Kitchen lamoTRIgine (LAMICTAL) 200 MG tablet Take 100-200 mg by mouth 2 (two) times daily. 1/2 tablet every morning and 1 tablet every evening    . LATUDA 60 MG TABS Take 1 tablet by mouth daily.  4  . metoprolol succinate (TOPROL-XL) 25 MG 24 hr tablet TAKE 1 TABLET BY MOUTH EVERY DAY 90 tablet 0  . metroNIDAZOLE (METROCREAM) 0.75 % cream Apply topically.    . Multiple Vitamin (MULTI-VITAMINS) TABS Take by mouth.    . olanzapine-FLUoxetine (SYMBYAX) 12-25 MG per capsule Take 1 capsule by mouth every evening.    Marland Kitchen QUEtiapine (SEROQUEL XR) 200 MG 24 hr tablet Take 1,000 mg by mouth at bedtime. Patient takes 5 tablets by mouth every night  at bedtime    . simvastatin (ZOCOR) 10 MG tablet Take 1 tablet (10 mg total) by mouth daily. 90 tablet 1   No current facility-administered medications on file prior to visit.     BP 120/80   Pulse 95   Temp 98.1 F (36.7 C) (Oral)   Ht 5\' 3"  (1.6 m)   Wt 223 lb 4 oz (101.3 kg)   LMP 09/08/2011   SpO2 96%   BMI 39.55 kg/m    Objective:   Physical Exam  Constitutional: She appears well-nourished.  Cardiovascular: Normal rate.  Respiratory: Effort normal.  GI: Soft. Bowel sounds are normal. There is no abdominal tenderness.  Genitourinary: There is no tenderness or lesion on the right labia. There is no tenderness or lesion on the left labia. Cervix exhibits discharge. Cervix exhibits no motion tenderness. Right adnexum displays no tenderness. Left adnexum displays no tenderness.    No vaginal discharge or erythema.  No erythema in the vagina.    Genitourinary Comments: Mild amount of whitish discharge from the cervix, no foul smell   Skin: Skin is warm and dry.           Assessment & Plan:

## 2018-08-01 NOTE — Patient Instructions (Signed)
Call the [facility] to schedule your mammogram.  We will be in touch regarding your pap smear results.  Start exercising. You should be getting 150 minutes of moderate intensity exercise weekly.  It's important to improve your diet by reducing consumption of fast food, fried food, processed snack foods, sugary drinks. Increase consumption of fresh vegetables and fruits, whole grains, water.  Ensure you are drinking 64 ounces of water daily.  It was a pleasure to see you today!

## 2018-08-01 NOTE — Assessment & Plan Note (Signed)
Pap smear completed today. Exam unremarkable, patient tolerated well. Repeat pap smear in 3 years if negative.

## 2018-08-02 LAB — CYTOLOGY - PAP
Diagnosis: NEGATIVE
HPV: NOT DETECTED

## 2018-08-11 NOTE — Telephone Encounter (Signed)
Katherine Carpenter, can we get her a copy of her pap smear report?

## 2018-08-11 NOTE — Telephone Encounter (Signed)
Katherine Carpenter, disregard.

## 2018-09-03 DIAGNOSIS — F3113 Bipolar disorder, current episode manic without psychotic features, severe: Secondary | ICD-10-CM | POA: Diagnosis not present

## 2018-09-08 LAB — HM MAMMOGRAPHY

## 2018-09-16 ENCOUNTER — Encounter: Payer: Self-pay | Admitting: Primary Care

## 2018-09-25 DIAGNOSIS — F3113 Bipolar disorder, current episode manic without psychotic features, severe: Secondary | ICD-10-CM | POA: Diagnosis not present

## 2018-10-08 DIAGNOSIS — F3113 Bipolar disorder, current episode manic without psychotic features, severe: Secondary | ICD-10-CM | POA: Diagnosis not present

## 2018-10-08 DIAGNOSIS — I10 Essential (primary) hypertension: Secondary | ICD-10-CM

## 2018-10-09 ENCOUNTER — Ambulatory Visit (INDEPENDENT_AMBULATORY_CARE_PROVIDER_SITE_OTHER): Payer: Medicare Other | Admitting: Primary Care

## 2018-10-09 ENCOUNTER — Encounter: Payer: Self-pay | Admitting: Primary Care

## 2018-10-09 DIAGNOSIS — K219 Gastro-esophageal reflux disease without esophagitis: Secondary | ICD-10-CM | POA: Diagnosis not present

## 2018-10-09 MED ORDER — METOPROLOL SUCCINATE ER 25 MG PO TB24: 12.5000 mg | ORAL_TABLET | Freq: Every day | ORAL | 1 refills | Status: DC

## 2018-10-09 NOTE — Progress Notes (Signed)
Subjective:    Patient ID: Katherine Carpenter, female    DOB: May 09, 1961, 58 y.o.   MRN: [id number]  HPI  Virtual Visit via Video Note  I connected with Katherine Carpenter on 10/09/18 at  2:00 PM EDT by a video enabled telemedicine application and verified that I am speaking with the correct person using two identifiers.  Location: Patient: Home Provider: Office   I discussed the limitations of evaluation and management by telemedicine and the availability of in person appointments. The patient expressed understanding and agreed to proceed. She is at home, I am in the office.  History of Present Illness:  Ms. Katherine Carpenter is a 58 year old female with a history of hypertension, bipolar disorder who presents today with a chief complaint of nausea.   She experiences nausea almost immediatly after meals and small snacks. She also experiences belching. She took Prilosec 20 mg once daily for one month with some improvement. She's not taken this for several weeks and feels like her symptoms increased after she stopped. She's been taking Pepto Bismol daily recently with improvement.   She denies abdominal pain, diarrhea, vomiting, unexplained weight loss, bloody stools.   Observations/Objective:  Alert and oriented. Appears well. No distress.  Assessment and Plan:  Symptoms representative of GERD, especially since symptoms improved with omeprazole 20 mg. Will have her stop Pepto Bismol, start omeprazole 20 mg daily. She has a box at home and will start.  Also discussed triggers for GERD and to avoid laying flat within 2 hours after a meal.  She will update in 2 weeks.   Follow Up Instructions:  Stop Pepto Bismol.  Start omeprazole 20 mg every evening at bedtime for reflux.  Avoid laying flat within 2 hours after eating. Take a look at the triggers for heartburn below.  It was a pleasure to see you today! Allie Bossier, NP-C    I discussed the assessment and treatment plan with the  patient. The patient was provided an opportunity to ask questions and all were answered. The patient agreed with the plan and demonstrated an understanding of the instructions.   The patient was advised to call back or seek an in-person evaluation if the symptoms worsen or if the condition fails to improve as anticipated.     Pleas Koch, NP    Review of Systems  Constitutional: Negative for fever and unexpected weight change.  Cardiovascular: Negative for chest pain.  Gastrointestinal: Positive for nausea. Negative for abdominal pain, blood in stool, constipation, diarrhea and vomiting.       Past Medical History:  Diagnosis Date  . Depression   . Hypertension      Social History   Socioeconomic History  . Marital status: Married    Spouse name: Not on file  . Number of children: Not on file  . Years of education: Not on file  . Highest education level: Not on file  Occupational History  . Not on file  Social Needs  . Financial resource strain: Not on file  . Food insecurity:    Worry: Not on file    Inability: Not on file  . Transportation needs:    Medical: Not on file    Non-medical: Not on file  Tobacco Use  . Smoking status: Never Smoker  . Smokeless tobacco: Never Used  Substance and Sexual Activity  . Alcohol use: No    Comment: Pt denies  . Drug use: No    Comment: Pt denies  .  Sexual activity: Not on file  Lifestyle  . Physical activity:    Days per week: Not on file    Minutes per session: Not on file  . Stress: Not on file  Relationships  . Social connections:    Talks on phone: Not on file    Gets together: Not on file    Attends religious service: Not on file    Active member of club or organization: Not on file    Attends meetings of clubs or organizations: Not on file    Relationship status: Not on file  . Intimate partner violence:    Fear of current or ex partner: Not on file    Emotionally abused: Not on file    Physically  abused: Not on file    Forced sexual activity: Not on file  Other Topics Concern  . Not on file  Social History Narrative  . Not on file    Past Surgical History:  Procedure Laterality Date  . TUBAL LIGATION      Family History  Problem Relation Age of Onset  . Asthma Mother   . Depression Daughter   . Arthritis Maternal Grandmother   . Cancer Maternal Grandmother        lung  . Cancer Paternal Grandmother   . Leukemia Paternal Grandmother     No Known Allergies  Current Outpatient Medications on File Prior to Visit  Medication Sig Dispense Refill  . ALPRAZolam (XANAX) 1 MG tablet Take 1 mg by mouth 5 (five) times daily. Patient may take up to 5 times a day    . amphetamine-dextroamphetamine (ADDERALL XR) 20 MG 24 hr capsule Take 20 mg by mouth daily.    Marland Kitchen buPROPion (WELLBUTRIN XL) 150 MG 24 hr tablet Take 150 mg by mouth daily.    . Calcium Carbonate-Vitamin D (OYSTER SHELL CALCIUM 500 + D) 500-125 MG-UNIT TABS Take by mouth.    . carbamazepine (EQUETRO) 200 MG CP12 Take 800 mg by mouth at bedtime.    Marland Kitchen doxycycline (VIBRAMYCIN) 100 MG capsule Take 100 mg by mouth daily.    . hydrochlorothiazide (HYDRODIURIL) 25 MG tablet Take 12.5 mg by mouth daily.    Marland Kitchen lamoTRIgine (LAMICTAL) 200 MG tablet Take 100-200 mg by mouth 2 (two) times daily. 1/2 tablet every morning and 1 tablet every evening    . LATUDA 60 MG TABS Take 1 tablet by mouth daily.  4  . metoprolol succinate (TOPROL-XL) 25 MG 24 hr tablet Take 0.5 tablets (12.5 mg total) by mouth daily. 90 tablet 1  . metroNIDAZOLE (METROCREAM) 0.75 % cream Apply topically.    . Multiple Vitamin (MULTI-VITAMINS) TABS Take by mouth.    . olanzapine-FLUoxetine (SYMBYAX) 12-25 MG per capsule Take 1 capsule by mouth every evening.    Marland Kitchen QUEtiapine (SEROQUEL XR) 200 MG 24 hr tablet Take 1,000 mg by mouth at bedtime. Patient takes 5 tablets by mouth every night at bedtime    . simvastatin (ZOCOR) 10 MG tablet Take 1 tablet (10 mg total) by  mouth daily. 90 tablet 1   No current facility-administered medications on file prior to visit.     LMP 09/08/2011    Objective:   Physical Exam  Constitutional: She is oriented to person, place, and time. She appears well-nourished.  Respiratory: Effort normal.  Neurological: She is alert and oriented to person, place, and time.  Skin: Skin is dry.  Psychiatric: She has a normal mood and affect.  Assessment & Plan:

## 2018-10-09 NOTE — Patient Instructions (Signed)
Stop Pepto Bismol.  Start omeprazole 20 mg every evening at bedtime for reflux.  Avoid laying flat within 2 hours after eating. Take a look at the triggers for heartburn below.  It was a pleasure to see you today! Allie Bossier, NP-C   Food Choices for Gastroesophageal Reflux Disease, Adult When you have gastroesophageal reflux disease (GERD), the foods you eat and your eating habits are very important. Choosing the right foods can help ease your discomfort. Think about working with a nutrition specialist (dietitian) to help you make good choices. What are tips for following this plan?  Meals  Choose healthy foods that are low in fat, such as fruits, vegetables, whole grains, low-fat dairy products, and lean meat, fish, and poultry.  Eat small meals often instead of 3 large meals a day. Eat your meals slowly, and in a place where you are relaxed. Avoid bending over or lying down until 2-3 hours after eating.  Avoid eating meals 2-3 hours before bed.  Avoid drinking a lot of liquid with meals.  Cook foods using methods other than frying. Bake, grill, or broil food instead.  Avoid or limit: ? Chocolate. ? Peppermint or spearmint. ? Alcohol. ? Pepper. ? Black and decaffeinated coffee. ? Black and decaffeinated tea. ? Bubbly (carbonated) soft drinks. ? Caffeinated energy drinks and soft drinks.  Limit high-fat foods such as: ? Fatty meat or fried foods. ? Whole milk, cream, butter, or ice cream. ? Nuts and nut butters. ? Pastries, donuts, and sweets made with butter or shortening.  Avoid foods that cause symptoms. These foods may be different for everyone. Common foods that cause symptoms include: ? Tomatoes. ? Oranges, lemons, and limes. ? Peppers. ? Spicy food. ? Onions and garlic. ? Vinegar. Lifestyle  Maintain a healthy weight. Ask your doctor what weight is healthy for you. If you need to lose weight, work with your doctor to do so safely.  Exercise for at least 30  minutes for 5 or more days each week, or as told by your doctor.  Wear loose-fitting clothes.  Do not smoke. If you need help quitting, ask your doctor.  Sleep with the head of your bed higher than your feet. Use a wedge under the mattress or blocks under the bed frame to raise the head of the bed. Summary  When you have gastroesophageal reflux disease (GERD), food and lifestyle choices are very important in easing your symptoms.  Eat small meals often instead of 3 large meals a day. Eat your meals slowly, and in a place where you are relaxed.  Limit high-fat foods such as fatty meat or fried foods.  Avoid bending over or lying down until 2-3 hours after eating.  Avoid peppermint and spearmint, caffeine, alcohol, and chocolate. This information is not intended to replace advice given to you by your health care provider. Make sure you discuss any questions you have with your health care provider. Document Released: 11/28/2011 Document Revised: 07/04/2016 Document Reviewed: 07/04/2016 Elsevier Interactive Patient Education  2019 [employer].

## 2018-10-09 NOTE — Telephone Encounter (Signed)
Lvm asking pt to call office 

## 2018-10-09 NOTE — Telephone Encounter (Signed)
Katherine Carpenter, will you schedule her for a virtual visit with me? Thanks!

## 2018-10-09 NOTE — Assessment & Plan Note (Signed)
Symptoms representative of GERD, especially since symptoms improved with omeprazole 20 mg. Will have her stop Pepto Bismol, start omeprazole 20 mg daily. She has a box at home and will start.  Also discussed triggers for GERD and to avoid laying flat within 2 hours after a meal.  She will update in 2 weeks.

## 2018-10-21 DIAGNOSIS — F3113 Bipolar disorder, current episode manic without psychotic features, severe: Secondary | ICD-10-CM | POA: Diagnosis not present

## 2018-11-11 DIAGNOSIS — F3113 Bipolar disorder, current episode manic without psychotic features, severe: Secondary | ICD-10-CM | POA: Diagnosis not present

## 2018-11-25 DIAGNOSIS — F3113 Bipolar disorder, current episode manic without psychotic features, severe: Secondary | ICD-10-CM | POA: Diagnosis not present

## 2018-12-09 DIAGNOSIS — F3113 Bipolar disorder, current episode manic without psychotic features, severe: Secondary | ICD-10-CM | POA: Diagnosis not present

## 2018-12-24 DIAGNOSIS — F3113 Bipolar disorder, current episode manic without psychotic features, severe: Secondary | ICD-10-CM | POA: Diagnosis not present

## 2019-01-13 DIAGNOSIS — F3113 Bipolar disorder, current episode manic without psychotic features, severe: Secondary | ICD-10-CM | POA: Diagnosis not present

## 2019-01-27 DIAGNOSIS — F3113 Bipolar disorder, current episode manic without psychotic features, severe: Secondary | ICD-10-CM | POA: Diagnosis not present

## 2019-02-26 DIAGNOSIS — L308 Other specified dermatitis: Secondary | ICD-10-CM | POA: Diagnosis not present

## 2019-02-26 DIAGNOSIS — L0889 Other specified local infections of the skin and subcutaneous tissue: Secondary | ICD-10-CM | POA: Diagnosis not present

## 2019-03-11 ENCOUNTER — Other Ambulatory Visit: Payer: Self-pay | Admitting: Primary Care

## 2019-03-17 ENCOUNTER — Other Ambulatory Visit: Payer: Self-pay | Admitting: Primary Care

## 2019-03-17 DIAGNOSIS — F3113 Bipolar disorder, current episode manic without psychotic features, severe: Secondary | ICD-10-CM | POA: Diagnosis not present

## 2019-03-17 DIAGNOSIS — Z1231 Encounter for screening mammogram for malignant neoplasm of breast: Secondary | ICD-10-CM

## 2019-03-19 ENCOUNTER — Encounter: Payer: Self-pay | Admitting: Primary Care

## 2019-03-19 ENCOUNTER — Other Ambulatory Visit: Payer: Self-pay

## 2019-03-19 ENCOUNTER — Ambulatory Visit (INDEPENDENT_AMBULATORY_CARE_PROVIDER_SITE_OTHER): Payer: Medicare Other | Admitting: Primary Care

## 2019-03-19 DIAGNOSIS — M779 Enthesopathy, unspecified: Secondary | ICD-10-CM

## 2019-03-19 MED ORDER — NAPROXEN 500 MG PO TABS: 500.0000 mg | ORAL_TABLET | Freq: Two times a day (BID) | ORAL | 0 refills | Status: DC

## 2019-03-19 NOTE — Assessment & Plan Note (Signed)
Symptoms representative for tendonitis. Discussed to avoid carrying her purse or other bags on left forearm. Treat with Naproxen 500 mg BID x 7 days. Consider brace for support. Discussed to follow up with Sports Medicine if no improvement after 2 weeks.

## 2019-03-19 NOTE — Patient Instructions (Signed)
Start naproxen 500 mg twice daily with meals for 7 days. Consider wearing a brace for support as discussed.  Avoid carrying your purse or other bags on your left arm.  Please schedule an appointment with our Sports Medicine doctor if no improvement within 2 weeks.  It was a pleasure to see you today! Allie Bossier, NP-C

## 2019-03-19 NOTE — Progress Notes (Signed)
Subjective:    Patient ID: Katherine Carpenter, female    DOB: 1961/05/18, 58 y.o.   MRN: [id number]  HPI  Virtual Visit via Video Note  I connected with Katherine Carpenter on 03/19/19 at  7:20 AM EDT by a video enabled telemedicine application and verified that I am speaking with the correct person using two identifiers.  Location: Patient: Home Provider: Office   I discussed the limitations of evaluation and management by telemedicine and the availability of in person appointments. The patient expressed understanding and agreed to proceed.  History of Present Illness:  Katherine Carpenter is a 58 year old female with a history of obesity, hypertension, bipolar disorder who presents today with a chief complaint of upper extremity pain.   She sent a message through the My Chart portal with reports of left arm pain with decrease in ROM. She was asked to schedule a visit for further evaluation.  Her pain is located to the lower to mid forearm which began 6-8 months ago, worse over the last 1-2 months. She cannot lift heavier than two pounds without her arm trembling. She carries her purse on the mid forearm which is heavy often.   She's tried taking Ibuprofen on occasion with some improvement, nothing routine. She denies weakness, numbness/tingling, radiculopathy, shoulder pain.   Observations/Objective:  Alert and oriented. Appears well, not sickly. No distress. Speaking in complete sentences.  Normal ROM to left upper extremity, including wrist and shoulder.  Assessment and Plan:  Symptoms representative for tendonitis. Discussed to avoid carrying her purse or other bags on left forearm. Treat with Naproxen 500 mg BID x 7 days. Consider brace for support. Discussed to follow up with Sports Medicine if no improvement after 2 weeks.  Follow Up Instructions:  Start naproxen 500 mg twice daily with meals for 7 days. Consider wearing a brace for support as discussed.  Avoid carrying  your purse or other bags on your left arm.  Please schedule an appointment with our Sports Medicine doctor if no improvement within 2 weeks.  It was a pleasure to see you today! Allie Bossier, NP-C    I discussed the assessment and treatment plan with the patient. The patient was provided an opportunity to ask questions and all were answered. The patient agreed with the plan and demonstrated an understanding of the instructions.   The patient was advised to call back or seek an in-person evaluation if the symptoms worsen or if the condition fails to improve as anticipated.   Pleas Koch, NP    Review of Systems  Musculoskeletal:       Left forearm pain, posterior (anatomical position)  Skin: Negative for color change.  Neurological: Negative for weakness and numbness.       Past Medical History:  Diagnosis Date  . Depression   . Hypertension      Social History   Socioeconomic History  . Marital status: Married    Spouse name: Not on file  . Number of children: Not on file  . Years of education: Not on file  . Highest education level: Not on file  Occupational History  . Not on file  Social Needs  . Financial resource strain: Not on file  . Food insecurity    Worry: Not on file    Inability: Not on file  . Transportation needs    Medical: Not on file    Non-medical: Not on file  Tobacco Use  . Smoking status: Never  Smoker  . Smokeless tobacco: Never Used  Substance and Sexual Activity  . Alcohol use: No    Comment: Pt denies  . Drug use: No    Comment: Pt denies  . Sexual activity: Not on file  Lifestyle  . Physical activity    Days per week: Not on file    Minutes per session: Not on file  . Stress: Not on file  Relationships  . Social Herbalist on phone: Not on file    Gets together: Not on file    Attends religious service: Not on file    Active member of club or organization: Not on file    Attends meetings of clubs or  organizations: Not on file    Relationship status: Not on file  . Intimate partner violence    Fear of current or ex partner: Not on file    Emotionally abused: Not on file    Physically abused: Not on file    Forced sexual activity: Not on file  Other Topics Concern  . Not on file  Social History Narrative  . Not on file    Past Surgical History:  Procedure Laterality Date  . TUBAL LIGATION      Family History  Problem Relation Age of Onset  . Asthma Mother   . Depression Daughter   . Arthritis Maternal Grandmother   . Cancer Maternal Grandmother        lung  . Cancer Paternal Grandmother   . Leukemia Paternal Grandmother     No Known Allergies  Current Outpatient Medications on File Prior to Visit  Medication Sig Dispense Refill  . ALPRAZolam (XANAX) 1 MG tablet Take 1 mg by mouth 5 (five) times daily. Patient may take up to 5 times a day    . amphetamine-dextroamphetamine (ADDERALL XR) 20 MG 24 hr capsule Take 20 mg by mouth daily.    Marland Kitchen buPROPion (WELLBUTRIN XL) 150 MG 24 hr tablet Take 150 mg by mouth daily.    . Calcium Carbonate-Vitamin D (OYSTER SHELL CALCIUM 500 + D) 500-125 MG-UNIT TABS Take by mouth.    . carbamazepine (EQUETRO) 200 MG CP12 Take 800 mg by mouth at bedtime.    Marland Kitchen doxycycline (VIBRAMYCIN) 100 MG capsule Take 100 mg by mouth daily.    . hydrochlorothiazide (HYDRODIURIL) 25 MG tablet Take 12.5 mg by mouth daily.    Marland Kitchen lamoTRIgine (LAMICTAL) 200 MG tablet Take 100-200 mg by mouth 2 (two) times daily. 1/2 tablet every morning and 1 tablet every evening    . LATUDA 60 MG TABS Take 1 tablet by mouth daily.  4  . metoprolol succinate (TOPROL-XL) 25 MG 24 hr tablet Take 0.5 tablets (12.5 mg total) by mouth daily. 90 tablet 1  . metroNIDAZOLE (METROCREAM) 0.75 % cream Apply topically.    . Multiple Vitamin (MULTI-VITAMINS) TABS Take by mouth.    . olanzapine-FLUoxetine (SYMBYAX) 12-25 MG per capsule Take 1 capsule by mouth every evening.    Marland Kitchen omeprazole  (PRILOSEC) 20 MG capsule Take 20 mg by mouth daily.    . QUEtiapine (SEROQUEL XR) 200 MG 24 hr tablet Take 1,000 mg by mouth at bedtime. Patient takes 5 tablets by mouth every night at bedtime    . simvastatin (ZOCOR) 10 MG tablet TAKE 1 TABLET BY MOUTH DAILY 90 tablet 2   No current facility-administered medications on file prior to visit.     Ht 5\' 3"  (1.6 m)   Wt 226 lb (  102.5 kg)   LMP 09/08/2011   BMI 40.03 kg/m    Objective:   Physical Exam  Constitutional: She appears well-nourished.  Musculoskeletal: Normal range of motion.     Left forearm: She exhibits no edema and no deformity.       Arms:     Comments: Left posterior lower forearm pain. Left upper extremity with normal ROM. Unable to palpate given virtual exam  Skin: No erythema.           Assessment & Plan:

## 2019-03-25 DIAGNOSIS — L308 Other specified dermatitis: Secondary | ICD-10-CM | POA: Diagnosis not present

## 2019-03-31 DIAGNOSIS — F3113 Bipolar disorder, current episode manic without psychotic features, severe: Secondary | ICD-10-CM | POA: Diagnosis not present

## 2019-04-01 ENCOUNTER — Ambulatory Visit
Admission: RE | Admit: 2019-04-01 | Discharge: 2019-04-01 | Disposition: A | Discharge status: Home / Self Care | Payer: Medicare Other | Source: Ambulatory Visit | Attending: Primary Care | Admitting: Primary Care

## 2019-04-01 ENCOUNTER — Other Ambulatory Visit: Payer: Self-pay

## 2019-04-01 ENCOUNTER — Other Ambulatory Visit: Payer: Medicare Other

## 2019-04-01 ENCOUNTER — Ambulatory Visit (INDEPENDENT_AMBULATORY_CARE_PROVIDER_SITE_OTHER): Payer: Medicare Other | Admitting: Primary Care

## 2019-04-01 ENCOUNTER — Encounter: Payer: Self-pay | Admitting: Primary Care

## 2019-04-01 VITALS — BP 124/84 | HR 100 | Temp 98.2°F | Ht 63.0 in | Wt 228.8 lb

## 2019-04-01 DIAGNOSIS — R109 Unspecified abdominal pain: Secondary | ICD-10-CM | POA: Insufficient documentation

## 2019-04-01 DIAGNOSIS — R1030 Lower abdominal pain, unspecified: Secondary | ICD-10-CM

## 2019-04-01 DIAGNOSIS — R1084 Generalized abdominal pain: Secondary | ICD-10-CM

## 2019-04-01 DIAGNOSIS — R110 Nausea: Secondary | ICD-10-CM

## 2019-04-01 DIAGNOSIS — R1031 Right lower quadrant pain: Secondary | ICD-10-CM | POA: Diagnosis not present

## 2019-04-01 LAB — CBC WITH DIFFERENTIAL/PLATELET
Basophils Absolute: 0.1 10*3/uL (ref 0.0–0.1)
Basophils Relative: 0.7 % (ref 0.0–3.0)
Eosinophils Absolute: 0.2 10*3/uL (ref 0.0–0.7)
Eosinophils Relative: 1.9 % (ref 0.0–5.0)
HCT: 41.8 % (ref 36.0–46.0)
Hemoglobin: 14.1 g/dL (ref 12.0–15.0)
Lymphocytes Relative: 29 % (ref 12.0–46.0)
Lymphs Abs: 2.5 10*3/uL (ref 0.7–4.0)
MCHC: 33.6 g/dL (ref 30.0–36.0)
MCV: 87.3 fl (ref 78.0–100.0)
Monocytes Absolute: 0.5 10*3/uL (ref 0.1–1.0)
Monocytes Relative: 5.5 % (ref 3.0–12.0)
Neutro Abs: 5.5 10*3/uL (ref 1.4–7.7)
Neutrophils Relative %: 62.9 % (ref 43.0–77.0)
Platelets: 260 10*3/uL (ref 150.0–400.0)
RBC: 4.79 Mil/uL (ref 3.87–5.11)
RDW: 13.9 % (ref 11.5–15.5)
WBC: 8.7 10*3/uL (ref 4.0–10.5)

## 2019-04-01 LAB — COMPREHENSIVE METABOLIC PANEL
ALT: 21 U/L (ref 0–35)
AST: 18 U/L (ref 0–37)
Albumin: 4.3 g/dL (ref 3.5–5.2)
Alkaline Phosphatase: 99 U/L (ref 39–117)
BUN: 25 mg/dL — ABNORMAL HIGH (ref 6–23)
CO2: 26 mEq/L (ref 19–32)
Calcium: 9.6 mg/dL (ref 8.4–10.5)
Chloride: 101 mEq/L (ref 96–112)
Creatinine, Ser: 0.91 mg/dL (ref 0.40–1.20)
GFR: 63.44 mL/min (ref >60.00)
Glucose, Bld: 94 mg/dL (ref 70–99)
Potassium: 4.2 mEq/L (ref 3.5–5.1)
Sodium: 136 mEq/L (ref 135–145)
Total Bilirubin: 0.3 mg/dL (ref 0.2–1.2)
Total Protein: 6.7 g/dL (ref 6.0–8.3)

## 2019-04-01 LAB — POC URINALSYSI DIPSTICK (AUTOMATED)
Bilirubin, UA: NEGATIVE
Blood, UA: NEGATIVE
Glucose, UA: NEGATIVE
Ketones, UA: NEGATIVE
Leukocytes, UA: NEGATIVE
Nitrite, UA: NEGATIVE
Protein, UA: NEGATIVE
Spec Grav, UA: 1.025 (ref 1.010–1.025)
Urobilinogen, UA: 0.2 E.U./dL
pH, UA: 5 (ref 5.0–8.0)

## 2019-04-01 LAB — POCT I-STAT CREATININE: Creatinine, Ser: 0.9 mg/dL (ref 0.44–1.00)

## 2019-04-01 MED ORDER — IOHEXOL 300 MG/ML  SOLN
125.0000 mL | Freq: Once | INTRAMUSCULAR | Status: AC | PRN
  Administered 2019-04-01: 12:00:00 125 mL via INTRAVENOUS

## 2019-04-01 MED ORDER — ONDANSETRON 4 MG PO TBDP: 4.0000 mg | ORAL_TABLET | Freq: Three times a day (TID) | ORAL | 0 refills | Status: DC | PRN

## 2019-04-01 NOTE — Progress Notes (Signed)
Subjective:    Patient ID: Katherine Carpenter, female    DOB: February 22, 1961, 58 y.o.   MRN: [id number]  HPI  Katherine Carpenter is a 58 year old female with a history of hypertension, GERD, Bipolar Disorder, hyperlipidemia, diverticulosis who presents today with a chief complaint of abdominal pain.  Her abdominal pain is located to the bilateral lower abdomen with radiation up to her right back and right shoulder. She describes her pain as "cramping" with bloating, constipation, urinary frequency, esophageal burning, and nausea. Symptoms will occur after taking a few bites of food. She does not have cramping when not eating but does have nausea. She has no cramping with drinking and has been drinking soda. Symptoms began 3-4 weeks ago and has progressed.   She denies vomiting, fevers, bloody stools, dysuria, vaginal symptoms. Her last bowel movement was this morning which was firm and "black". She's taken Pepto Bismol with temporary improvement, she is also taking omeprazole without much improvement.   She underwent colonoscopy at the age of 65, endorses diverticulosis and polyps. She's never had a flare of diverticulitis. We do not have that report.   Review of Systems  Constitutional: Negative for fever.  Respiratory: Negative for shortness of breath.   Cardiovascular: Negative for chest pain.  Gastrointestinal: Positive for abdominal pain, constipation and nausea. Negative for blood in stool, diarrhea and vomiting.       Past Medical History:  Diagnosis Date  . Depression   . Hypertension      Social History   Socioeconomic History  . Marital status: Married    Spouse name: Not on file  . Number of children: Not on file  . Years of education: Not on file  . Highest education level: Not on file  Occupational History  . Not on file  Social Needs  . Financial resource strain: Not on file  . Food insecurity    Worry: Not on file    Inability: Not on file  . Transportation needs   Medical: Not on file    Non-medical: Not on file  Tobacco Use  . Smoking status: Never Smoker  . Smokeless tobacco: Never Used  Substance and Sexual Activity  . Alcohol use: No    Comment: Pt denies  . Drug use: No    Comment: Pt denies  . Sexual activity: Not on file  Lifestyle  . Physical activity    Days per week: Not on file    Minutes per session: Not on file  . Stress: Not on file  Relationships  . Social Herbalist on phone: Not on file    Gets together: Not on file    Attends religious service: Not on file    Active member of club or organization: Not on file    Attends meetings of clubs or organizations: Not on file    Relationship status: Not on file  . Intimate partner violence    Fear of current or ex partner: Not on file    Emotionally abused: Not on file    Physically abused: Not on file    Forced sexual activity: Not on file  Other Topics Concern  . Not on file  Social History Narrative  . Not on file    Past Surgical History:  Procedure Laterality Date  . TUBAL LIGATION      Family History  Problem Relation Age of Onset  . Asthma Mother   . Depression Daughter   . Arthritis  Maternal Grandmother   . Cancer Maternal Grandmother        lung  . Cancer Paternal Grandmother   . Leukemia Paternal Grandmother     No Known Allergies  Current Outpatient Medications on File Prior to Visit  Medication Sig Dispense Refill  . ALPRAZolam (XANAX) 1 MG tablet Take 1 mg by mouth 5 (five) times daily. Patient may take up to 5 times a day    . amphetamine-dextroamphetamine (ADDERALL XR) 20 MG 24 hr capsule Take 20 mg by mouth daily.    Marland Kitchen augmented betamethasone dipropionate (DIPROLENE-AF) 0.05 % cream Apply topically 2 (two) times daily.    Marland Kitchen buPROPion (WELLBUTRIN XL) 150 MG 24 hr tablet Take 150 mg by mouth daily.    . Calcium Carbonate-Vitamin D (OYSTER SHELL CALCIUM 500 + D) 500-125 MG-UNIT TABS Take by mouth.    . carbamazepine (EQUETRO) 200 MG  CP12 Take 800 mg by mouth at bedtime.    Marland Kitchen doxycycline (VIBRAMYCIN) 100 MG capsule Take 100 mg by mouth daily.    . hydrochlorothiazide (HYDRODIURIL) 25 MG tablet Take 12.5 mg by mouth daily.    Marland Kitchen lamoTRIgine (LAMICTAL) 200 MG tablet Take 100-200 mg by mouth 2 (two) times daily. 1/2 tablet every morning and 1 tablet every evening    . LATUDA 60 MG TABS Take 1 tablet by mouth daily.  4  . metoprolol succinate (TOPROL-XL) 25 MG 24 hr tablet Take 0.5 tablets (12.5 mg total) by mouth daily. 90 tablet 1  . metroNIDAZOLE (METROCREAM) 0.75 % cream Apply topically.    . Multiple Vitamin (MULTI-VITAMINS) TABS Take by mouth.    . naproxen (NAPROSYN) 500 MG tablet Take 1 tablet (500 mg total) by mouth 2 (two) times daily with a meal. 14 tablet 0  . olanzapine-FLUoxetine (SYMBYAX) 12-25 MG per capsule Take 1 capsule by mouth every evening.    Marland Kitchen omeprazole (PRILOSEC) 20 MG capsule Take 20 mg by mouth daily.    . QUEtiapine (SEROQUEL XR) 200 MG 24 hr tablet Take 1,000 mg by mouth at bedtime. Patient takes 5 tablets by mouth every night at bedtime    . simvastatin (ZOCOR) 10 MG tablet TAKE 1 TABLET BY MOUTH DAILY 90 tablet 2   No current facility-administered medications on file prior to visit.     BP 124/84   Pulse 100   Temp 98.2 F (36.8 C) (Temporal)   Ht 5\' 3"  (1.6 m)   Wt 228 lb 12 oz (103.8 kg)   LMP 09/08/2011   SpO2 97%   BMI 40.52 kg/m    Objective:   Physical Exam  Constitutional: She appears well-nourished.  Neck: Neck supple.  Cardiovascular: Normal rate and regular rhythm.  Respiratory: Effort normal and breath sounds normal.  GI: Soft. Normal appearance and bowel sounds are normal. There is abdominal tenderness in the right upper quadrant. There is negative Murphy's sign.    Pain/cramping to bilateral lower abdomen, actually tender to RUQ.  Skin: Skin is warm and dry.           Assessment & Plan:

## 2019-04-01 NOTE — Assessment & Plan Note (Signed)
Acute for the last 2-3 weeks, progressing. Oddly enough most of her pain is lower abdomen but she is tender to RUQ.  Differentials including acute diverticulitis, colitis, cholecystitis, GERD.   Given progressive symptoms with inability to eat, history of diverticulosis with no history of diverticulitis (no colonoscopy or prior imaging to view), tenderness to RUQ we need to rule out numerous causes. Ultrasound will not be the best given the need to evaluate the colon.  Stat CT abdomen/pelvis ordered. CBC and CMP pending. UA today negative.  Discussed clear liquid diet starting now.  Exam and presentation stable today for outpatient treatment.

## 2019-04-01 NOTE — Patient Instructions (Addendum)
Stop by the lab prior to leaving today. I will notify you of your results once received.   Stop by the front desk and speak with either Rosaria Ferries or Charmaine regarding your CT scan. I will be in touch with your results once received.  Go on a clear liquid diet for now.   It was a pleasure to see you today!    Clear Liquid Diet, Adult A clear liquid diet is a diet that includes only liquids and semi-liquids that you can see through. You do not eat any food on this diet. Most people need to follow this diet for only a short time. You may need to follow a clear liquid diet if:  You have a problem right before or after you have surgery.  You did not eat food for a long time.  You had any of these: ? Feeling sick to your stomach (nausea). ? Throwing up (vomiting). ? Passing a watery stool (diarrhea).  You are going to have an exam to look at parts of your digestive system.  You are going to have bowel surgery. The goals of this diet are:  To rest the stomach.  To help you clear the digestive system before an exam.  To make sure that there is enough fluid in your body.  To make sure you get some energy.  To help you get back to eating like you used to. What are tips for following this plan?  A clear liquid is a liquid or semi-liquid that you can see through when you hold it up to a light. An example of this is gelatin.  This diet does not give you all the nutrients that you need. Choose a variety of the liquids that your doctor says you can drink on this diet. That way, you will get as many nutrients as possible.  If you are not sure whether you can have certain items, ask your doctor. If you are unable to swallow a thin liquid, you will need to thicken it before taking it. This will stop you from breathing it in (aspiration). What foods should I eat?   Water and flavored water.  Fruit juices that do not have pulp, such as cranberry juice and apple juice.  Tea and coffee  without milk or cream.  Clear bouillon or broth.  Broth-based soups that have been strained.  Flavored gelatins.  Honey.  Sugar water.  Ice or frozen ice pops that do not have any milk, yogurt, fruit pieces, or fruit pulp in them.  Clear sodas.  Clear sports drinks. The items listed above may not be a complete list of what you can eat and drink. Contact a dietitian for more options. What foods should I avoid?  Juices that have pulp.  Milk.  Cream or cream-based soups.  Yogurt.  Normal foods that are not clear liquids or semi-liquids. The items listed above may not be a complete list of what you should not eat and drink. Contact a dietitian for options. Questions to ask your health care provider  How long do I need to follow this diet?  Are there any medicines that I should change while on this diet? Summary  A clear liquid diet is a diet that includes only liquids and semi-liquids that you can see through.  Some goals of this diet are to rest your stomach, make sure you get enough fluid, and give you some energy.  Avoid liquids with milk, cream, or pulp while you are on  this diet. This information is not intended to replace advice given to you by your health care provider. Make sure you discuss any questions you have with your health care provider. Document Released: 05/11/2008 Document Revised: 11/19/2017 Document Reviewed: 11/19/2017 Elsevier Patient Education  2020 [employer].

## 2019-04-02 NOTE — Progress Notes (Signed)
Patient has no pain, less swelling today. Patient is following recommendations on take home sheet after extravasation. She will call if condition changes.

## 2019-04-04 DIAGNOSIS — F3113 Bipolar disorder, current episode manic without psychotic features, severe: Secondary | ICD-10-CM | POA: Diagnosis not present

## 2019-05-12 DIAGNOSIS — F3113 Bipolar disorder, current episode manic without psychotic features, severe: Secondary | ICD-10-CM | POA: Diagnosis not present

## 2019-05-27 ENCOUNTER — Other Ambulatory Visit: Payer: Self-pay

## 2019-05-27 ENCOUNTER — Ambulatory Visit (INDEPENDENT_AMBULATORY_CARE_PROVIDER_SITE_OTHER): Payer: Medicare Other | Admitting: Primary Care

## 2019-05-27 ENCOUNTER — Encounter: Payer: Self-pay | Admitting: Primary Care

## 2019-05-27 VITALS — Temp 99.1°F

## 2019-05-27 DIAGNOSIS — K219 Gastro-esophageal reflux disease without esophagitis: Secondary | ICD-10-CM | POA: Diagnosis not present

## 2019-05-27 DIAGNOSIS — Z20828 Contact with and (suspected) exposure to other viral communicable diseases: Secondary | ICD-10-CM | POA: Diagnosis not present

## 2019-05-27 DIAGNOSIS — R110 Nausea: Secondary | ICD-10-CM | POA: Diagnosis not present

## 2019-05-27 DIAGNOSIS — Z20822 Contact with and (suspected) exposure to covid-19: Secondary | ICD-10-CM | POA: Insufficient documentation

## 2019-05-27 MED ORDER — OMEPRAZOLE 40 MG PO CPDR: 40.0000 mg | DELAYED_RELEASE_CAPSULE | Freq: Every evening | ORAL | 0 refills | Status: DC

## 2019-05-27 NOTE — Progress Notes (Signed)
Subjective:    Patient ID: Katherine Carpenter, female    DOB: 04-05-1961, 58 y.o.   MRN: [id number]  HPI  Virtual Visit via Video Note  I connected with Katherine Carpenter on 05/27/19 at 12:00 PM EST by a video enabled telemedicine application and verified that I am speaking with the correct person using two identifiers.  Location: Patient: Home Provider: Office   I discussed the limitations of evaluation and management by telemedicine and the availability of in person appointments. The patient expressed understanding and agreed to proceed.  History of Present Illness:  Ms. Puerta is a 58 year old female with a history of hypertension, GERD, hyperlipidemia, bipolar disorder who presents today with a chief complaint of cough and chronic nausea.  She also reports fatigue, some production to cough with clear sputum, loss of taste/smell which began four days ago. Husband has similar symptoms that have been present for a few days longer.  She's been taking Robitussin with improvement.  She continues to feel chronic nausea that is constant all day along with throat fullness. She is compliant to her omeprazole 20 mg twice daily without improvement to nausea. She denies abdominal pain with eating, constipation, bright red bleeding in her stools. She has noticed black stools, has been taking Pepto Bismol over the last several days with some improvement in nausea.   She denies recent changes to her medications, has not discussed chronic nausea with her psychiatrist.    Observations/Objective:  Alert and oriented. Appears tired. No distress. Speaking in complete sentences. No cough during exam.  Assessment and Plan:  See problem based charting.  Follow Up Instructions:  Start omeprazole (Prilosec) 40 mg every evening and famotidine (Pepcid) 20 mg every morning for nausea and throat fullness.  Please discuss nausea symptoms with your psychiatrist as your medications could be causing  symptoms.  Please take a look at your current medication list and notify me if my records are not correct.  Please update me with your Covid-19 test result.  It was a pleasure to see you today! Allie Bossier, NP-C    I discussed the assessment and treatment plan with the patient. The patient was provided an opportunity to ask questions and all were answered. The patient agreed with the plan and demonstrated an understanding of the instructions.   The patient was advised to call back or seek an in-person evaluation if the symptoms worsen or if the condition fails to improve as anticipated.  This visit occurred during the SARS-CoV-2 public health emergency.  Safety protocols were in place, including screening questions prior to the visit, additional usage of staff PPE, and extensive cleaning of exam room while observing appropriate contact time as indicated for disinfecting solutions.      Pleas Koch, NP    Review of Systems  Constitutional: Positive for fatigue and fever.  HENT: Positive for congestion.        Loss of taste/smell  Respiratory: Positive for cough.   Gastrointestinal: Positive for nausea. Negative for abdominal pain, blood in stool, constipation and vomiting.       Past Medical History:  Diagnosis Date  . Depression   . Hypertension      Social History   Socioeconomic History  . Marital status: Married    Spouse name: Not on file  . Number of children: Not on file  . Years of education: Not on file  . Highest education level: Not on file  Occupational History  . Not  on file  Tobacco Use  . Smoking status: Never Smoker  . Smokeless tobacco: Never Used  Substance and Sexual Activity  . Alcohol use: No    Comment: Pt denies  . Drug use: No    Comment: Pt denies  . Sexual activity: Not on file  Other Topics Concern  . Not on file  Social History Narrative  . Not on file   Social Determinants of Health   Financial Resource Strain:   .  Difficulty of Paying Living Expenses: Not on file  Food Insecurity:   . Worried About Charity fundraiser in the Last Year: Not on file  . Ran Out of Food in the Last Year: Not on file  Transportation Needs:   . Lack of Transportation (Medical): Not on file  . Lack of Transportation (Non-Medical): Not on file  Physical Activity:   . Days of Exercise per Week: Not on file  . Minutes of Exercise per Session: Not on file  Stress:   . Feeling of Stress : Not on file  Social Connections:   . Frequency of Communication with Friends and Family: Not on file  . Frequency of Social Gatherings with Friends and Family: Not on file  . Attends Religious Services: Not on file  . Active Member of Clubs or Organizations: Not on file  . Attends Archivist Meetings: Not on file  . Marital Status: Not on file  Intimate Partner Violence:   . Fear of Current or Ex-Partner: Not on file  . Emotionally Abused: Not on file  . Physically Abused: Not on file  . Sexually Abused: Not on file    Past Surgical History:  Procedure Laterality Date  . TUBAL LIGATION      Family History  Problem Relation Age of Onset  . Asthma Mother   . Depression Daughter   . Arthritis Maternal Grandmother   . Cancer Maternal Grandmother        lung  . Cancer Paternal Grandmother   . Leukemia Paternal Grandmother     No Known Allergies  Current Outpatient Medications on File Prior to Visit  Medication Sig Dispense Refill  . ALPRAZolam (XANAX) 1 MG tablet Take 1 mg by mouth 5 (five) times daily. Patient may take up to 5 times a day    . amphetamine-dextroamphetamine (ADDERALL XR) 20 MG 24 hr capsule Take 20 mg by mouth daily.    Marland Kitchen augmented betamethasone dipropionate (DIPROLENE-AF) 0.05 % cream Apply topically 2 (two) times daily.    Marland Kitchen buPROPion (WELLBUTRIN XL) 150 MG 24 hr tablet Take 150 mg by mouth daily.    . Calcium Carbonate-Vitamin D (OYSTER SHELL CALCIUM 500 + D) 500-125 MG-UNIT TABS Take by mouth.     . carbamazepine (EQUETRO) 200 MG CP12 Take 800 mg by mouth at bedtime.    Marland Kitchen doxycycline (VIBRAMYCIN) 100 MG capsule Take 100 mg by mouth daily.    . hydrochlorothiazide (HYDRODIURIL) 25 MG tablet Take 12.5 mg by mouth daily.    Marland Kitchen lamoTRIgine (LAMICTAL) 200 MG tablet Take 100-200 mg by mouth 2 (two) times daily. 1/2 tablet every morning and 1 tablet every evening    . LATUDA 60 MG TABS Take 1 tablet by mouth daily.  4  . metoprolol succinate (TOPROL-XL) 25 MG 24 hr tablet Take 0.5 tablets (12.5 mg total) by mouth daily. 90 tablet 1  . metroNIDAZOLE (METROCREAM) 0.75 % cream Apply topically.    . Multiple Vitamin (MULTI-VITAMINS) TABS Take by mouth.    Marland Kitchen  naproxen (NAPROSYN) 500 MG tablet Take 1 tablet (500 mg total) by mouth 2 (two) times daily with a meal. 14 tablet 0  . olanzapine-FLUoxetine (SYMBYAX) 12-25 MG per capsule Take 1 capsule by mouth every evening.    Marland Kitchen omeprazole (PRILOSEC) 20 MG capsule Take 20 mg by mouth daily.    . ondansetron (ZOFRAN ODT) 4 MG disintegrating tablet Take 1 tablet (4 mg total) by mouth every 8 (eight) hours as needed for nausea or vomiting. 15 tablet 0  . QUEtiapine (SEROQUEL XR) 200 MG 24 hr tablet Take 1,000 mg by mouth at bedtime. Patient takes 5 tablets by mouth every night at bedtime    . simvastatin (ZOCOR) 10 MG tablet TAKE 1 TABLET BY MOUTH DAILY 90 tablet 2   No current facility-administered medications on file prior to visit.    Temp 99.1 F (37.3 C) (Temporal)   LMP 09/08/2011    Objective:   Physical Exam  Constitutional: She is oriented to person, place, and time. She appears well-nourished.  Appears tired  Respiratory: Effort normal. No respiratory distress.  No cough during exam  Neurological: She is alert and oriented to person, place, and time.  Psychiatric: She has a normal mood and affect.           Assessment & Plan:

## 2019-05-27 NOTE — Patient Instructions (Addendum)
Start omeprazole (Prilosec) 40 mg every evening and famotidine (Pepcid) 20 mg every morning for nausea and throat fullness.  Please discuss nausea symptoms with your psychiatrist as your medications could be causing symptoms.  Please take a look at your current medication list and notify me if my records are not correct.  Please update me with your Covid-19 test result.  It was a pleasure to see you today! Allie Bossier, NP-

## 2019-05-27 NOTE — Assessment & Plan Note (Signed)
Continued nausea x 2 months now, no improvement with prescribed PPI at max dose.  Rx for omeprazole 40 mg provided to take HS, start famotidine in AM.   She will update.

## 2019-05-27 NOTE — Assessment & Plan Note (Signed)
Acute symptoms within days of her husband. Testing pending. Discussed quarantine instructions.  Overall appears stable for home/conservative treatment. She will update regarding test result.

## 2019-05-27 NOTE — Assessment & Plan Note (Signed)
Nearly chronic now, also with throat fullness and no improvement with omeprazole.   Suspect nausea could be secondary to psyc medications, will have her mention this to her psychiatrist.   Will adjust omeprazole and add famotidine, she will update.

## 2019-06-23 NOTE — Telephone Encounter (Signed)
Hi Robin, FYI. This is for [employer].

## 2019-07-10 DIAGNOSIS — L719 Rosacea, unspecified: Secondary | ICD-10-CM | POA: Diagnosis not present

## 2019-07-10 DIAGNOSIS — L308 Other specified dermatitis: Secondary | ICD-10-CM | POA: Diagnosis not present

## 2019-07-21 DIAGNOSIS — F3113 Bipolar disorder, current episode manic without psychotic features, severe: Secondary | ICD-10-CM | POA: Diagnosis not present

## 2019-08-04 DIAGNOSIS — F3113 Bipolar disorder, current episode manic without psychotic features, severe: Secondary | ICD-10-CM | POA: Diagnosis not present

## 2019-08-17 ENCOUNTER — Other Ambulatory Visit: Payer: Self-pay | Admitting: Primary Care

## 2019-08-17 DIAGNOSIS — R110 Nausea: Secondary | ICD-10-CM

## 2019-08-17 DIAGNOSIS — K219 Gastro-esophageal reflux disease without esophagitis: Secondary | ICD-10-CM

## 2019-08-18 DIAGNOSIS — F3113 Bipolar disorder, current episode manic without psychotic features, severe: Secondary | ICD-10-CM | POA: Diagnosis not present

## 2019-08-19 DIAGNOSIS — K219 Gastro-esophageal reflux disease without esophagitis: Secondary | ICD-10-CM

## 2019-08-19 DIAGNOSIS — R110 Nausea: Secondary | ICD-10-CM

## 2019-08-19 MED ORDER — OMEPRAZOLE 20 MG PO CPDR: 20.0000 mg | DELAYED_RELEASE_CAPSULE | Freq: Every day | ORAL | 0 refills | Status: DC

## 2019-08-19 NOTE — Telephone Encounter (Signed)
Sen the refill request to you as well.

## 2019-08-19 NOTE — Telephone Encounter (Signed)
Ok to refill? Last prescribed on 05/27/2019 . Last appointment on 05/27/2019. No future appointment

## 2019-08-29 ENCOUNTER — Ambulatory Visit: Payer: Medicare Other | Attending: Internal Medicine

## 2019-08-29 DIAGNOSIS — Z23 Encounter for immunization: Secondary | ICD-10-CM

## 2019-08-29 NOTE — Progress Notes (Signed)
   Covid-19 Vaccination Clinic  Name:  Katherine Carpenter    MRN: [id number] DOB: 1961/05/07  08/29/2019  [name] was observed post Covid-19 immunization for 15 minutes without incident. She was provided with Vaccine Information Sheet and instruction to access the V-Safe system.   [name] was instructed to call 911 with any severe reactions post vaccine: Marland Kitchen Difficulty breathing  . Swelling of face and throat  . A fast heartbeat  . A bad rash all over body  . Dizziness and weakness   Immunizations Administered    Name Date Dose VIS Date Route   Pfizer COVID-19 Vaccine 08/29/2019 10:26 AM 0.3 mL 05/23/2019 Intramuscular   Manufacturer: [place]   Lot: [id number]   [place]: [id number]

## 2019-09-05 ENCOUNTER — Ambulatory Visit
Admission: RE | Admit: 2019-09-05 | Discharge: 2019-09-05 | Disposition: A | Discharge status: Home / Self Care | Payer: Medicare Other | Source: Ambulatory Visit | Attending: Primary Care | Admitting: Primary Care

## 2019-09-05 DIAGNOSIS — Z1231 Encounter for screening mammogram for malignant neoplasm of breast: Secondary | ICD-10-CM | POA: Diagnosis not present

## 2019-09-15 ENCOUNTER — Other Ambulatory Visit: Payer: Self-pay | Admitting: Primary Care

## 2019-09-15 DIAGNOSIS — K219 Gastro-esophageal reflux disease without esophagitis: Secondary | ICD-10-CM

## 2019-09-15 DIAGNOSIS — R110 Nausea: Secondary | ICD-10-CM

## 2019-09-16 ENCOUNTER — Other Ambulatory Visit: Payer: Self-pay | Admitting: Primary Care

## 2019-09-16 DIAGNOSIS — K219 Gastro-esophageal reflux disease without esophagitis: Secondary | ICD-10-CM

## 2019-09-16 MED ORDER — OMEPRAZOLE 40 MG PO CPDR: 40.0000 mg | DELAYED_RELEASE_CAPSULE | Freq: Every day | ORAL | 1 refills | Status: DC

## 2019-09-16 NOTE — Telephone Encounter (Signed)
There is a mychart message about this

## 2019-09-19 ENCOUNTER — Other Ambulatory Visit: Payer: Self-pay | Admitting: Primary Care

## 2019-09-19 DIAGNOSIS — I10 Essential (primary) hypertension: Secondary | ICD-10-CM

## 2019-09-23 ENCOUNTER — Ambulatory Visit: Payer: Medicare Other | Attending: Internal Medicine

## 2019-09-23 DIAGNOSIS — Z23 Encounter for immunization: Secondary | ICD-10-CM

## 2019-09-23 NOTE — Progress Notes (Signed)
   Covid-19 Vaccination Clinic  Name:  Katherine Carpenter    MRN: [id number] DOB: 1961/04/20  09/23/2019  [name] was observed post Covid-19 immunization for 15 minutes without incident. She was provided with Vaccine Information Sheet and instruction to access the V-Safe system.   [name] was instructed to call 911 with any severe reactions post vaccine: Marland Kitchen Difficulty breathing  . Swelling of face and throat  . A fast heartbeat  . A bad rash all over body  . Dizziness and weakness   Immunizations Administered    Name Date Dose VIS Date Route   Pfizer COVID-19 Vaccine 09/23/2019 11:22 AM 0.3 mL 05/23/2019 Intramuscular   Manufacturer: [place]   Lot: [id number]   [place]: [id number]

## 2019-09-24 ENCOUNTER — Encounter: Payer: Self-pay | Admitting: Gastroenterology

## 2019-09-25 ENCOUNTER — Other Ambulatory Visit: Payer: Self-pay

## 2019-09-25 ENCOUNTER — Ambulatory Visit (INDEPENDENT_AMBULATORY_CARE_PROVIDER_SITE_OTHER): Payer: Medicare Other | Admitting: Gastroenterology

## 2019-09-25 DIAGNOSIS — K219 Gastro-esophageal reflux disease without esophagitis: Secondary | ICD-10-CM | POA: Diagnosis not present

## 2019-09-25 DIAGNOSIS — R109 Unspecified abdominal pain: Secondary | ICD-10-CM

## 2019-09-25 NOTE — Progress Notes (Signed)
Katherine Carpenter  [place],  [address]  Main: [phone]  Fax: [phone]   Gastroenterology Consultation  Referring Provider:     Pleas Koch, NP Primary Care Physician:  Pleas Koch, NP Reason for Consultation:    GERD        HPI:   Virtual Visit via Video Note  I connected with patient on 09/25/19 at  1:15 PM EDT by video ([email or web address]) and verified that I am speaking with the correct person using two identifiers.   I discussed the limitations, risks, security and privacy concerns of performing an evaluation and management service by video and the availability of in person appointments. I also discussed with the patient that there may be a patient responsible charge related to this service. The patient expressed understanding and agreed to proceed.  Location of the patient: Home Location of provider: Home Participating persons: Patient and provider only (Nursing staff checked in patient via phone but were not physically involved in the video interaction - see their notes)   History of Present Illness: Chief Complaint  Patient presents with  . New Patient (Initial Visit)  . Gastroesophageal Reflux    acid reflex that comes and go with what she eats. has some nausea     Katherine Carpenter is a 59 y.o. y/o female referred for consultation & management  by Dr. [name], Leticia Penna, NP.  Pt describes heartburn and stomach cramps especially with spicy foods. Also has some associated nausea. 1 yr history of symptoms. Pt has been on PPI and it helps significantly if she is on Omeprazole 40 mg daily. Decrease in dose leads to immediate symptoms.   Was on NSAIDs previously (Naproxen, Aleeve), but stopped it last year.  CT scan last year reassuring. NO family history of colon cancer.   Reports colonoscopy 8 years ago, reports not available  Past Medical History:  Diagnosis Date  . Depression   . Hypertension     Past Surgical  History:  Procedure Laterality Date  . TUBAL LIGATION      Prior to Admission medications   Medication Sig Start Date End Date Taking? Authorizing Provider  ALPRAZolam Duanne Moron) 1 MG tablet Take 1 mg by mouth 5 (five) times daily. Patient may take up to 5 times a day   Yes [provider]  amphetamine-dextroamphetamine (ADDERALL XR) 20 MG 24 hr capsule Take 20 mg by mouth daily.   Yes [provider]  augmented betamethasone dipropionate (DIPROLENE-AF) 0.05 % cream Apply topically 2 (two) times daily.   Yes [provider]  buPROPion (WELLBUTRIN XL) 150 MG 24 hr tablet Take 150 mg by mouth daily.   Yes [provider]  Calcium Carbonate-Vitamin D (OYSTER SHELL CALCIUM 500 + D) 500-125 MG-UNIT TABS Take by mouth.   Yes [provider]  carbamazepine (EQUETRO) 200 MG CP12 Take 800 mg by mouth at bedtime.   Yes [provider]  carbamazepine (TEGRETOL) 200 MG tablet TAKE ONE TABLET BY MOUTH EVERY MORNING AND THREE TABLETS AT BEDTIME 07/15/19  Yes [provider]  doxycycline (VIBRAMYCIN) 100 MG capsule Take 100 mg by mouth daily.   Yes [provider]  hydrochlorothiazide (HYDRODIURIL) 25 MG tablet Take 12.5 mg by mouth daily.   Yes [provider]  lamoTRIgine (LAMICTAL) 200 MG tablet Take 100-200 mg by mouth 2 (two) times daily. 1/2 tablet every morning and 1 tablet every evening   Yes [provider]  LATUDA 60 MG TABS Take 1 tablet by mouth daily. 04/21/18  Yes [provider]  metoprolol succinate (TOPROL-XL) 25 MG 24 hr tablet TAKE 1/2 TABLET BY MOUTH EVERY DAY 09/19/19  Yes Pleas Koch, NP  metroNIDAZOLE (METROCREAM) 0.75 % cream Apply topically.   Yes [provider]  Multiple Vitamin (MULTI-VITAMINS) TABS Take by mouth.   Yes [provider]  naproxen (NAPROSYN) 500 MG tablet Take 1 tablet (500 mg total) by mouth 2 (two) times daily with a meal. 03/19/19  Yes Pleas Koch, NP  olanzapine-FLUoxetine (SYMBYAX) 12-25 MG per capsule Take 1 capsule by mouth every evening.   Yes [provider]  omeprazole (PRILOSEC) 40 MG capsule Take 1 capsule (40 mg total) by mouth daily. For heartburn. 09/16/19  Yes Pleas Koch, NP  ondansetron (ZOFRAN ODT) 4 MG disintegrating tablet Take 1 tablet (4 mg total) by mouth every 8 (eight) hours as needed for nausea or vomiting. 04/01/19  Yes Pleas Koch, NP  QUEtiapine (SEROQUEL XR) 200 MG 24 hr tablet Take 1,000 mg by mouth at bedtime. Patient takes 5 tablets by mouth every night at bedtime   Yes [provider]  simvastatin (ZOCOR) 10 MG tablet TAKE 1 TABLET BY MOUTH DAILY 03/11/19  Yes Pleas Koch, NP    Family History  Problem Relation Age of Onset  . Asthma Mother   . Depression Daughter   . Arthritis Maternal Grandmother   . Cancer Maternal Grandmother        lung  . Cancer Paternal Grandmother   . Leukemia Paternal Grandmother      Social History   Tobacco Use  . Smoking status: Never Smoker  . Smokeless tobacco: Never Used  Substance Use Topics  . Alcohol use: No    Comment: Pt denies  . Drug use: No    Comment: Pt denies    Allergies as of 09/25/2019  . (No Known Allergies)    Review of Systems:    All systems reviewed and negative except where noted in HPI.   Observations/Objective:  Labs: CBC    Component Value Date/Time   WBC 8.7 04/01/2019 0931   RBC 4.79 04/01/2019 0931   HGB 14.1 04/01/2019 0931   HCT 41.8 04/01/2019 0931   PLT 260.0 04/01/2019 0931   MCV 87.3 04/01/2019 0931   MCH 28.0 09/10/2011 0840   MCHC 33.6 04/01/2019 0931   RDW 13.9 04/01/2019 0931   LYMPHSABS 2.5 04/01/2019 0931   MONOABS 0.5 04/01/2019 0931   EOSABS 0.2 04/01/2019 0931   BASOSABS 0.1 04/01/2019 0931   CMP     Component Value Date/Time   NA 136 04/01/2019 0931   K 4.2 04/01/2019 0931   CL 101 04/01/2019 0931   CO2 26 04/01/2019 0931   GLUCOSE 94  04/01/2019 0931   BUN 25 (H) 04/01/2019 0931   CREATININE 0.90 04/01/2019 1150   CALCIUM 9.6 04/01/2019 0931   PROT 6.7 04/01/2019 0931   ALBUMIN 4.3 04/01/2019 0931   AST 18 04/01/2019 0931   ALT 21 04/01/2019 0931   ALKPHOS 99 04/01/2019 0931   BILITOT 0.3 04/01/2019 0931   GFRNONAA 81 (L) 09/10/2011 0840   GFRAA >90 09/10/2011 0840    Imaging Studies: MM 3D SCREEN BREAST BILATERAL  Result Date: 09/05/2019 CLINICAL DATA:  Screening. EXAM: DIGITAL SCREENING BILATERAL MAMMOGRAM WITH TOMO AND CAD COMPARISON:  Previous exam(s). ACR Breast Density Category a: The breast tissue is almost entirely fatty. FINDINGS: There are  no findings suspicious for malignancy. Images were processed with CAD. IMPRESSION: No mammographic evidence of malignancy. A result letter of this screening mammogram will be mailed directly to the patient. RECOMMENDATION: Screening mammogram in one year. (Code:SM-B-01Y) BI-RADS CATEGORY  1: Negative. Electronically Signed   By: Lajean Manes M.D.   On: 09/05/2019 14:30    Assessment and Plan:   EMSLEY JODON is a 59 y.o. y/o female has been referred for stomach discomfort and heartburn  Assessment and Plan: Patient unable to decrease or come off PPI as symptoms worsen  Was previously on NSAIDs and may have underlying gastritis or small ulcer  CT scan otherwise reassuring and no alarm symptoms present  However, due to ongoing symptoms we discussed EGD for biopsies of H. pylori and evaluation of etiology of ongoing symptoms.  Versus conservative management with medications discussed.  I have discussed alternative options, risks & benefits,  which include, but are not limited to, bleeding, infection, perforation,respiratory complication & drug reaction.  The patient agrees with this plan & written consent will be obtained.    We will try to obtain colonoscopy records from 2013 as patient states polyps were found and she may be due for her repeat colonoscopy if they  were precancerous  Avoid NSAID use such as Ibuprofen, Aleeve, advil, motrin, BC and Goodie powder, Naproxen, Meloxicam and others.    Follow Up Instructions:   I discussed the assessment and treatment plan with the patient. The patient was provided an opportunity to ask questions and all were answered. The patient agreed with the plan and demonstrated an understanding of the instructions.   The patient was advised to call back or seek an in-person evaluation if the symptoms worsen or if the condition fails to improve as anticipated.  I provided18 minutes of face-to-face time via video software during this encounter.  Additional time was spent in reviewing patient's chart, placing orders etc.   Virgel Manifold, MD  Speech recognition software was used to dictate the above note.

## 2019-10-13 ENCOUNTER — Other Ambulatory Visit: Payer: Self-pay | Admitting: Primary Care

## 2019-10-13 DIAGNOSIS — K219 Gastro-esophageal reflux disease without esophagitis: Secondary | ICD-10-CM

## 2019-10-13 NOTE — Telephone Encounter (Signed)
[facility] Night - Client Nonclinical Telephone Record  [facility] Client [place] Night - Client Client Site [place] Physician Alma Friendly - NP Contact Type Call Who Is Calling Patient / Member / Family / Caregiver Caller Name [facility] Phone Number [phone] Patient Name Katherine Carpenter Patient DOB [birth year] Call Type Message Only Information Provided Reason for Call Request to Schedule Office Appointment Initial Comment Caller needs to schedule in office visit. Additional Comment Office hours provided. Triage refused. Disp. Time Disposition Final User 10/10/2019 5:57:08 PM General Information Provided Yes Marshell Garfinkel Call Closed By: Marshell Garfinkel Transaction Date/Time: 10/10/2019 5:55:03 PM (ET)

## 2019-10-14 NOTE — Telephone Encounter (Signed)
Dr. Bonna Gains, See omeprazole refill request, is the plan to continue for now?  Thank you for evaluating Katherine Carpenter!

## 2019-10-14 NOTE — Telephone Encounter (Signed)
Last prescribed on 09/16/2019 . Last OV on 05/27/2019. No future OV

## 2019-10-15 ENCOUNTER — Other Ambulatory Visit: Payer: Self-pay

## 2019-10-15 ENCOUNTER — Encounter: Payer: Self-pay | Admitting: Primary Care

## 2019-10-15 ENCOUNTER — Ambulatory Visit (INDEPENDENT_AMBULATORY_CARE_PROVIDER_SITE_OTHER): Payer: Medicare Other | Admitting: Primary Care

## 2019-10-15 VITALS — BP 108/70 | HR 113 | Temp 97.5°F | Ht 63.0 in | Wt 218.5 lb

## 2019-10-15 DIAGNOSIS — R2689 Other abnormalities of gait and mobility: Secondary | ICD-10-CM

## 2019-10-15 DIAGNOSIS — Z1329 Encounter for screening for other suspected endocrine disorder: Secondary | ICD-10-CM | POA: Diagnosis not present

## 2019-10-15 LAB — BASIC METABOLIC PANEL
BUN: 26 mg/dL — ABNORMAL HIGH (ref 6–23)
CO2: 33 mEq/L — ABNORMAL HIGH (ref 19–32)
Calcium: 9.5 mg/dL (ref 8.4–10.5)
Chloride: 100 mEq/L (ref 96–112)
Creatinine, Ser: 1.03 mg/dL (ref 0.40–1.20)
GFR: 54.89 mL/min — ABNORMAL LOW (ref >60.00)
Glucose, Bld: 121 mg/dL — ABNORMAL HIGH (ref 70–99)
Potassium: 3.8 mEq/L (ref 3.5–5.1)
Sodium: 139 mEq/L (ref 135–145)

## 2019-10-15 LAB — TSH: TSH: 1.14 u[IU]/mL (ref 0.35–4.50)

## 2019-10-15 LAB — CBC
HCT: 40.8 % (ref 36.0–46.0)
Hemoglobin: 13.6 g/dL (ref 12.0–15.0)
MCHC: 33.5 g/dL (ref 30.0–36.0)
MCV: 88.5 fl (ref 78.0–100.0)
Platelets: 268 10*3/uL (ref 150.0–400.0)
RBC: 4.61 Mil/uL (ref 3.87–5.11)
RDW: 13.9 % (ref 11.5–15.5)
WBC: 9.5 10*3/uL (ref 4.0–10.5)

## 2019-10-15 MED ORDER — FLUTICASONE PROPIONATE 50 MCG/ACT NA SUSP: 1.0000 | Freq: Two times a day (BID) | NASAL | 0 refills | Status: DC

## 2019-10-15 NOTE — Progress Notes (Signed)
Subjective:    Patient ID: Katherine Carpenter, female    DOB: January 11, 1961, 59 y.o.   MRN: [id number]  HPI  This visit occurred during the SARS-CoV-2 public health emergency.  Safety protocols were in place, including screening questions prior to the visit, additional usage of staff PPE, and extensive cleaning of exam room while observing appropriate contact time as indicated for disinfecting solutions.   Katherine Carpenter is a 59 year old female with a history of obesity, GERD, hypertension, hyperlipidemia, Bipolar Disorder, Covid-19 infection who presents today with a chief complaint of feeling off balance.  She reports bilateral ear itching, deep down. She has noticed that she will trip or lose her balance when walking or walking when standing from a seated position. She fell one week ago while walking into her mother's house. She tripped over her own feet.  She denies numbness to her lower extremities. She has notice "fluttering" in eyes, saw her eye doctor who told her that she was engaging in too much screen time.   She denies room spinning sensation, dizzy, unilateral weakness, changes in speech, ear pain. She has fallen 2-3 times within the last year. Symptoms of tripping and feeling off balance began about one year ago, occur 2-3 times weekly, no worse.   She is walking on the treadmill and is active daily. She does take a daily antihistamine for seasonal allergies.   Review of Systems  Cardiovascular: Negative for chest pain.  Allergic/Immunologic: Positive for environmental allergies.  Neurological: Negative for dizziness, weakness, light-headedness and numbness.       Past Medical History:  Diagnosis Date  . Depression   . Hypertension      Social History   Socioeconomic History  . Marital status: Married    Spouse name: Not on file  . Number of children: Not on file  . Years of education: Not on file  . Highest education level: Not on file  Occupational History  . Not on  file  Tobacco Use  . Smoking status: Never Smoker  . Smokeless tobacco: Never Used  Substance and Sexual Activity  . Alcohol use: No    Comment: Pt denies  . Drug use: No    Comment: Pt denies  . Sexual activity: Not on file  Other Topics Concern  . Not on file  Social History Narrative  . Not on file   Social Determinants of Health   Financial Resource Strain:   . Difficulty of Paying Living Expenses:   Food Insecurity:   . Worried About Charity fundraiser in the Last Year:   . Arboriculturist in the Last Year:   Transportation Needs:   . Film/video editor (Medical):   Marland Kitchen Lack of Transportation (Non-Medical):   Physical Activity:   . Days of Exercise per Week:   . Minutes of Exercise per Session:   Stress:   . Feeling of Stress :   Social Connections:   . Frequency of Communication with Friends and Family:   . Frequency of Social Gatherings with Friends and Family:   . Attends Religious Services:   . Active Member of Clubs or Organizations:   . Attends Archivist Meetings:   Marland Kitchen Marital Status:   Intimate Partner Violence:   . Fear of Current or Ex-Partner:   . Emotionally Abused:   Marland Kitchen Physically Abused:   . Sexually Abused:     Past Surgical History:  Procedure Laterality Date  . TUBAL LIGATION  Family History  Problem Relation Age of Onset  . Asthma Mother   . Depression Daughter   . Arthritis Maternal Grandmother   . Cancer Maternal Grandmother        lung  . Cancer Paternal Grandmother   . Leukemia Paternal Grandmother     No Known Allergies  Current Outpatient Medications on File Prior to Visit  Medication Sig Dispense Refill  . ALPRAZolam (XANAX) 1 MG tablet Take 1 mg by mouth 5 (five) times daily. Patient may take up to 5 times a day    . amphetamine-dextroamphetamine (ADDERALL XR) 20 MG 24 hr capsule Take 20 mg by mouth daily.    Marland Kitchen augmented betamethasone dipropionate (DIPROLENE-AF) 0.05 % cream Apply topically 2 (two) times  daily.    Marland Kitchen buPROPion (WELLBUTRIN XL) 150 MG 24 hr tablet Take 150 mg by mouth daily.    . Calcium Carbonate-Vitamin D (OYSTER SHELL CALCIUM 500 + D) 500-125 MG-UNIT TABS Take by mouth.    . carbamazepine (EQUETRO) 200 MG CP12 Take 800 mg by mouth at bedtime.    . carbamazepine (TEGRETOL) 200 MG tablet TAKE ONE TABLET BY MOUTH EVERY MORNING AND THREE TABLETS AT BEDTIME    . doxycycline (VIBRAMYCIN) 100 MG capsule Take 100 mg by mouth daily.    . hydrochlorothiazide (HYDRODIURIL) 25 MG tablet Take 12.5 mg by mouth daily.    Marland Kitchen lamoTRIgine (LAMICTAL) 200 MG tablet Take 100-200 mg by mouth 2 (two) times daily. 1/2 tablet every morning and 1 tablet every evening    . LATUDA 60 MG TABS Take 1 tablet by mouth daily.  4  . metoprolol succinate (TOPROL-XL) 25 MG 24 hr tablet TAKE 1/2 TABLET BY MOUTH EVERY DAY 45 tablet 3  . metroNIDAZOLE (METROCREAM) 0.75 % cream Apply topically.    . Multiple Vitamin (MULTI-VITAMINS) TABS Take by mouth.    . naproxen (NAPROSYN) 500 MG tablet Take 1 tablet (500 mg total) by mouth 2 (two) times daily with a meal. 14 tablet 0  . olanzapine-FLUoxetine (SYMBYAX) 12-25 MG per capsule Take 1 capsule by mouth every evening.    Marland Kitchen omeprazole (PRILOSEC) 40 MG capsule Take 1 capsule (40 mg total) by mouth daily. For heartburn. 30 capsule 1  . ondansetron (ZOFRAN ODT) 4 MG disintegrating tablet Take 1 tablet (4 mg total) by mouth every 8 (eight) hours as needed for nausea or vomiting. 15 tablet 0  . QUEtiapine (SEROQUEL XR) 200 MG 24 hr tablet Take 1,000 mg by mouth at bedtime. Patient takes 5 tablets by mouth every night at bedtime    . simvastatin (ZOCOR) 10 MG tablet TAKE 1 TABLET BY MOUTH DAILY 90 tablet 2   No current facility-administered medications on file prior to visit.    BP 108/70   Pulse (!) 113   Temp (!) 97.5 F (36.4 C) (Temporal)   Ht 5\' 3"  (1.6 m)   Wt 218 lb 8 oz (99.1 kg)   LMP 09/08/2011   SpO2 98%   BMI 38.71 kg/m    Objective:   Physical Exam    Constitutional: She is oriented to person, place, and time. She appears well-nourished.  HENT:  Right Ear: Tympanic membrane is bulging. Tympanic membrane is not erythematous.  Left Ear: Tympanic membrane is bulging. Tympanic membrane is not erythematous.  Eyes: EOM are normal.  Cardiovascular: Normal rate and regular rhythm.  Respiratory: Effort normal and breath sounds normal.  Musculoskeletal:        General: Normal range of motion.  Cervical back: Neck supple.     Comments: Strength equal bilaterally   Neurological: She is alert and oriented to person, place, and time. She has normal strength. She displays a negative Romberg sign. Coordination and gait normal.  Skin: Skin is warm and dry.  Psychiatric: She has a normal mood and affect.           Assessment & Plan:

## 2019-10-15 NOTE — Patient Instructions (Addendum)
Consider physical therapy for imbalance.  Stop by the lab prior to leaving today. I will notify you of your results once received.   Try using Flonase (fluticasone) nasal spray. Instill 1 spray in each nostril twice daily for the next several weeks.  Please update me as discussed.  It was a pleasure to see you today!

## 2019-10-15 NOTE — Assessment & Plan Note (Addendum)
Chronic for the last year, occurring weekly, also with a few falls.   Neuro exam unremarkable. Strength equal without weakness. No FH of neurological disorders.  Need to keep MS in differentials.  Recommended PT for balance, she declines today and will have to check on insurance coverage.  Checking labs today. Resume Flonase daily given bulging in TM's without erythema. Consider ENT vs Neuro referral.  She will update.

## 2019-10-16 DIAGNOSIS — R2689 Other abnormalities of gait and mobility: Secondary | ICD-10-CM

## 2019-10-23 ENCOUNTER — Other Ambulatory Visit: Payer: Self-pay

## 2019-10-23 ENCOUNTER — Other Ambulatory Visit
Admission: RE | Admit: 2019-10-23 | Discharge: 2019-10-23 | Disposition: A | Discharge status: Home / Self Care | Payer: Medicare Other | Source: Ambulatory Visit | Attending: Gastroenterology | Admitting: Gastroenterology

## 2019-10-23 DIAGNOSIS — Z01812 Encounter for preprocedural laboratory examination: Secondary | ICD-10-CM | POA: Diagnosis not present

## 2019-10-23 DIAGNOSIS — Z20822 Contact with and (suspected) exposure to covid-19: Secondary | ICD-10-CM | POA: Diagnosis not present

## 2019-10-23 LAB — SARS CORONAVIRUS 2 (TAT 6-24 HRS): SARS Coronavirus 2: NEGATIVE

## 2019-10-27 ENCOUNTER — Ambulatory Visit: Payer: Medicare Other | Admitting: Certified Registered Nurse Anesthetist

## 2019-10-27 ENCOUNTER — Encounter: Payer: Self-pay | Admitting: Gastroenterology

## 2019-10-27 ENCOUNTER — Ambulatory Visit
Admission: RE | Admit: 2019-10-27 | Discharge: 2019-10-27 | Disposition: A | Discharge status: Home / Self Care | Payer: Medicare Other | Attending: Gastroenterology | Admitting: Gastroenterology

## 2019-10-27 ENCOUNTER — Encounter
Admission: RE | Disposition: A | Discharge status: Home / Self Care | Payer: Self-pay | Source: Home / Self Care | Attending: Gastroenterology

## 2019-10-27 ENCOUNTER — Other Ambulatory Visit: Payer: Self-pay

## 2019-10-27 DIAGNOSIS — K3189 Other diseases of stomach and duodenum: Secondary | ICD-10-CM

## 2019-10-27 DIAGNOSIS — F319 Bipolar disorder, unspecified: Secondary | ICD-10-CM | POA: Insufficient documentation

## 2019-10-27 DIAGNOSIS — R109 Unspecified abdominal pain: Secondary | ICD-10-CM | POA: Diagnosis not present

## 2019-10-27 DIAGNOSIS — K2100 Gastro-esophageal reflux disease with esophagitis, without bleeding: Secondary | ICD-10-CM | POA: Insufficient documentation

## 2019-10-27 DIAGNOSIS — K319 Disease of stomach and duodenum, unspecified: Secondary | ICD-10-CM | POA: Insufficient documentation

## 2019-10-27 DIAGNOSIS — K2970 Gastritis, unspecified, without bleeding: Secondary | ICD-10-CM | POA: Diagnosis not present

## 2019-10-27 DIAGNOSIS — I10 Essential (primary) hypertension: Secondary | ICD-10-CM | POA: Insufficient documentation

## 2019-10-27 DIAGNOSIS — Z79899 Other long term (current) drug therapy: Secondary | ICD-10-CM | POA: Insufficient documentation

## 2019-10-27 DIAGNOSIS — R1013 Epigastric pain: Secondary | ICD-10-CM | POA: Insufficient documentation

## 2019-10-27 HISTORY — PX: ESOPHAGOGASTRODUODENOSCOPY (EGD) WITH PROPOFOL: SHX5813

## 2019-10-27 SURGERY — ESOPHAGOGASTRODUODENOSCOPY (EGD) WITH PROPOFOL
Anesthesia: General

## 2019-10-27 MED ORDER — PROPOFOL 10 MG/ML IV BOLUS
INTRAVENOUS | Status: AC
  Filled 2019-10-27: qty 20

## 2019-10-27 MED ORDER — PROPOFOL 10 MG/ML IV BOLUS
INTRAVENOUS | Status: DC | PRN
  Administered 2019-10-27: 40 mg via INTRAVENOUS
  Administered 2019-10-27: 60 mg via INTRAVENOUS
  Administered 2019-10-27 (×2): 40 mg via INTRAVENOUS

## 2019-10-27 MED ORDER — LIDOCAINE HCL (CARDIAC) PF 100 MG/5ML IV SOSY
PREFILLED_SYRINGE | INTRAVENOUS | Status: DC | PRN
  Administered 2019-10-27: 100 mg via INTRAVENOUS

## 2019-10-27 MED ORDER — GLYCOPYRROLATE 0.2 MG/ML IJ SOLN
INTRAMUSCULAR | Status: AC
  Filled 2019-10-27: qty 1

## 2019-10-27 MED ORDER — PROPOFOL 500 MG/50ML IV EMUL
INTRAVENOUS | Status: DC | PRN
  Administered 2019-10-27: 150 ug/kg/min via INTRAVENOUS

## 2019-10-27 MED ORDER — SODIUM CHLORIDE 0.9 % IV SOLN: INTRAVENOUS | Status: DC

## 2019-10-27 MED ORDER — LIDOCAINE HCL (PF) 2 % IJ SOLN
INTRAMUSCULAR | Status: AC
  Filled 2019-10-27: qty 10

## 2019-10-27 MED ORDER — ONDANSETRON HCL 4 MG/2ML IJ SOLN
INTRAMUSCULAR | Status: AC
  Filled 2019-10-27: qty 2

## 2019-10-27 MED ORDER — PROPOFOL 500 MG/50ML IV EMUL
INTRAVENOUS | Status: AC
  Filled 2019-10-27: qty 50

## 2019-10-27 NOTE — Anesthesia Preprocedure Evaluation (Signed)
Anesthesia Evaluation  Patient identified by MRN, date of birth, ID band Patient awake    Reviewed: Allergy & Precautions, NPO status , Patient's Chart, lab work & pertinent test results  History of Anesthesia Complications Negative for: history of anesthetic complications  Airway Mallampati: III  TM Distance: >3 FB Neck ROM: Full    Dental no notable dental hx. (+) Teeth Intact   Pulmonary neg pulmonary ROS, neg sleep apnea, neg COPD, Patient abstained from smoking.Not current smoker,    Pulmonary exam normal breath sounds clear to auscultation       Cardiovascular Exercise Tolerance: Good METShypertension, (-) CAD and (-) Past MI (-) dysrhythmias  Rhythm:Regular Rate:Normal - Systolic murmurs    Neuro/Psych PSYCHIATRIC DISORDERS Depression Bipolar Disorder negative neurological ROS     GI/Hepatic GERD  ,(+)     (-) substance abuse  ,   Endo/Other  neg diabetes  Renal/GU negative Renal ROS     Musculoskeletal   Abdominal   Peds  Hematology   Anesthesia Other Findings Past Medical History: No date: Depression No date: Hypertension  Reproductive/Obstetrics                             Anesthesia Physical Anesthesia Plan  ASA: II  Anesthesia Plan: General   Post-op Pain Management:    Induction: Intravenous  PONV Risk Score and Plan: 3 and Ondansetron, Propofol infusion and TIVA  Airway Management Planned: Nasal Cannula  Additional Equipment: None  Intra-op Plan:   Post-operative Plan:   Informed Consent: I have reviewed the patients History and Physical, chart, labs and discussed the procedure including the risks, benefits and alternatives for the proposed anesthesia with the patient or authorized representative who has indicated his/her understanding and acceptance.     Dental advisory given  Plan Discussed with: CRNA and Surgeon  Anesthesia Plan Comments:  (Discussed risks of anesthesia with patient, including possibility of difficulty with spontaneous ventilation under anesthesia necessitating airway intervention, PONV, and rare risks such as cardiac or respiratory or neurological events. Patient understands.)        Anesthesia Quick Evaluation

## 2019-10-27 NOTE — Transfer of Care (Signed)
Immediate Anesthesia Transfer of Care Note  Patient: Katherine Carpenter  Procedure(s) Performed: ESOPHAGOGASTRODUODENOSCOPY (EGD) WITH PROPOFOL (N/A )  Patient Location: PACU  Anesthesia Type:General  Level of Consciousness: drowsy  Airway & Oxygen Therapy: Patient Spontanous Breathing and Patient connected to nasal cannula oxygen  Post-op Assessment: Report given to RN and Post -op Vital signs reviewed and stable  Post vital signs: Reviewed and stable  Last Vitals:  Vitals Value Taken Time  BP 146/79 10/27/19 1106  Temp 36.4 C 10/27/19 1106  Pulse 83 10/27/19 1108  Resp 23 10/27/19 1108  SpO2 100 % 10/27/19 1108  Vitals shown include unvalidated device data.  Last Pain:  Vitals:   10/27/19 1106  TempSrc: Temporal  PainSc: 0-No pain         Complications: No apparent anesthesia complications

## 2019-10-27 NOTE — Anesthesia Postprocedure Evaluation (Signed)
Anesthesia Post Note  Patient: Katherine Carpenter  Procedure(s) Performed: ESOPHAGOGASTRODUODENOSCOPY (EGD) WITH PROPOFOL (N/A )  Patient location during evaluation: Endoscopy Anesthesia Type: General Level of consciousness: awake and alert Pain management: pain level controlled Vital Signs Assessment: post-procedure vital signs reviewed and stable Respiratory status: spontaneous breathing, nonlabored ventilation, respiratory function stable and patient connected to nasal cannula oxygen Cardiovascular status: blood pressure returned to baseline and stable Postop Assessment: no apparent nausea or vomiting Anesthetic complications: no     Last Vitals:  Vitals:   10/27/19 1106 10/27/19 1116  BP: (!) 146/79 (!) 164/91  Pulse: 89   Resp: 16   Temp: (!) 36.4 C   SpO2: 93%     Last Pain:  Vitals:   10/27/19 1126  TempSrc:   PainSc: 0-No pain                 Arita Miss

## 2019-10-27 NOTE — Op Note (Signed)
[facility] Gastroenterology Patient Name: Katherine Carpenter Procedure Date: 10/27/2019 10:30 AM MRN: [id number] Account #: [id number] Date of Birth: [birth year] Admit Type: Outpatient Age: 59 Room: [facility] ENDO ROOM 3 Gender: Female Note Status: Finalized Procedure:             Upper GI endoscopy Indications:           Epigastric abdominal pain, Heartburn Providers:             Tajai Ihde B. Bonna Gains MD, MD Medicines:             Monitored Anesthesia Care Complications:         No immediate complications. Procedure:             Pre-Anesthesia Assessment:                        - Prior to the procedure, a History and Physical was                         performed, and patient medications, allergies and                         sensitivities were reviewed. The patient's tolerance                         of previous anesthesia was reviewed.                        - The risks and benefits of the procedure and the                         sedation options and risks were discussed with the                         patient. All questions were answered and informed                         consent was obtained.                        - Patient identification and proposed procedure were                         verified prior to the procedure by the physician, the                         nurse, the anesthesiologist, the anesthetist and the                         technician. The procedure was verified in the                         procedure room.                        - ASA Grade Assessment: II - A patient with mild                         systemic disease.  After obtaining informed consent, the endoscope was                         passed under direct vision. Throughout the procedure,                         the patient's blood pressure, pulse, and oxygen                         saturations were monitored continuously. The Endoscope   was introduced through the mouth, and advanced to the                         second part of duodenum. The upper GI endoscopy was                         accomplished with ease. The patient tolerated the                         procedure well. Findings:      One tongue of salmon-colored mucosa was present. No other visible       abnormalities were present. Mucosa was biopsied with a cold forceps for       histology in a targeted manner and in 4 quadrants.      The exam of the esophagus was otherwise normal.      Patchy mildly erythematous mucosa without bleeding was found in the       gastric antrum. Biopsies were taken with a cold forceps for histology.       Biopsies were obtained in the gastric body, at the incisura and in the       gastric antrum with cold forceps for histology.      The duodenal bulb, second portion of the duodenum and examined duodenum       were normal. Impression:            - Salmon-colored mucosa suspicious for short-segment                         Barrett's esophagus. Biopsied.                        - Erythematous mucosa in the antrum. Biopsied.                        - Normal duodenal bulb, second portion of the duodenum                         and examined duodenum.                        - Biopsies were obtained in the gastric body, at the                         incisura and in the gastric antrum. Recommendation:        - Await pathology results.                        - Discharge patient to home (with escort).                        -  Advance diet as tolerated.                        - Continue present medications.                        - Patient has a contact number available for                         emergencies. The signs and symptoms of potential                         delayed complications were discussed with the patient.                         Return to normal activities tomorrow. Written                         discharge instructions were  provided to the patient.                        - Discharge patient to home (with escort).                        - The findings and recommendations were discussed with                         the patient.                        - The findings and recommendations were discussed with                         the patient's family.                        - Follow an antireflux regimen. Procedure Code(s):     --- Professional ---                        [phone], Esophagogastroduodenoscopy, flexible,                         transoral; with biopsy, single or multiple Diagnosis Code(s):     --- Professional ---                        K22.8, Other specified diseases of esophagus                        K31.89, Other diseases of stomach and duodenum                        R10.13, Epigastric pain                        R12, Heartburn CPT copyright 2019 American Medical Association. All rights reserved. The codes documented in this report are preliminary and upon coder review may  be revised to meet current compliance requirements.  Vonda Antigua, MD Margretta Sidle B. Bonna Gains MD, MD 10/27/2019 11:03:25 AM This report has been signed electronically. Number of Addenda: 0 Note Initiated On: 10/27/2019 10:30  AM Estimated Blood Loss:  Estimated blood loss: none.      [facility]

## 2019-10-27 NOTE — H&P (Signed)
Vonda Antigua, MD [address], [place], [place], [place], [address] [address], [place], [place], [place], [address] Phone: [phone]  Fax: [phone]  Primary Care Physician:  Pleas Koch, NP   Pre-Procedure History & Physical: HPI:  Katherine Carpenter is a 59 y.o. female is here for an EGD.   Past Medical History:  Diagnosis Date  . Depression   . Hypertension     Past Surgical History:  Procedure Laterality Date  . CERVICAL BIOPSY  W/ LOOP ELECTRODE EXCISION  2016  . TUBAL LIGATION      Prior to Admission medications   Medication Sig Start Date End Date Taking? Authorizing Provider  ALPRAZolam Duanne Moron) 1 MG tablet Take 1 mg by mouth 5 (five) times daily. Patient may take up to 5 times a day   Yes [provider]  amphetamine-dextroamphetamine (ADDERALL XR) 20 MG 24 hr capsule Take 20 mg by mouth daily.   Yes [provider]  augmented betamethasone dipropionate (DIPROLENE-AF) 0.05 % cream Apply topically 2 (two) times daily.   Yes [provider]  buPROPion (WELLBUTRIN XL) 150 MG 24 hr tablet Take 150 mg by mouth daily.   Yes [provider]  carbamazepine (EQUETRO) 200 MG CP12 Take 800 mg by mouth at bedtime.   Yes [provider]  carbamazepine (TEGRETOL) 200 MG tablet TAKE ONE TABLET BY MOUTH EVERY MORNING AND THREE TABLETS AT BEDTIME 07/15/19  Yes [provider]  doxycycline (VIBRAMYCIN) 100 MG capsule Take 100 mg by mouth daily.   Yes [provider]  fluticasone (FLONASE) 50 MCG/ACT nasal spray Place 1 spray into both nostrils 2 (two) times daily. 10/15/19  Yes Pleas Koch, NP  hydrochlorothiazide (HYDRODIURIL) 25 MG tablet Take 12.5 mg by mouth daily.   Yes [provider]  lamoTRIgine (LAMICTAL) 200 MG tablet Take 100-200 mg by mouth 2 (two) times daily. 1/2 tablet every morning and 1 tablet every evening   Yes [provider]  metoprolol succinate (TOPROL-XL) 25  MG 24 hr tablet TAKE 1/2 TABLET BY MOUTH EVERY DAY 09/19/19  Yes Pleas Koch, NP  naproxen (NAPROSYN) 500 MG tablet Take 1 tablet (500 mg total) by mouth 2 (two) times daily with a meal. 03/19/19  Yes Pleas Koch, NP  olanzapine-FLUoxetine (SYMBYAX) 12-25 MG per capsule Take 1 capsule by mouth every evening.   Yes [provider]  omeprazole (PRILOSEC) 40 MG capsule TAKE 1 CAPSULE BY MOUTH EVERY DAY FOR HEARTBURN 10/15/19  Yes Latrel Szymczak, Margretta Sidle B, MD  ondansetron (ZOFRAN ODT) 4 MG disintegrating tablet Take 1 tablet (4 mg total) by mouth every 8 (eight) hours as needed for nausea or vomiting. 04/01/19  Yes Pleas Koch, NP  QUEtiapine (SEROQUEL XR) 200 MG 24 hr tablet Take 1,000 mg by mouth at bedtime. Patient takes 5 tablets by mouth every night at bedtime   Yes [provider]  simvastatin (ZOCOR) 10 MG tablet TAKE 1 TABLET BY MOUTH DAILY 03/11/19  Yes Pleas Koch, NP  Calcium Carbonate-Vitamin D (OYSTER SHELL CALCIUM 500 + D) 500-125 MG-UNIT TABS Take by mouth.    [provider]  LATUDA 60 MG TABS Take 1 tablet by mouth daily. 04/21/18   [provider]  metroNIDAZOLE (METROCREAM) 0.75 % cream Apply topically.    [provider]  Multiple Vitamin (MULTI-VITAMINS) TABS Take by mouth.    [provider]    Allergies as of 09/25/2019  . (No Known Allergies)  Family History  Problem Relation Age of Onset  . Asthma Mother   . Depression Daughter   . Arthritis Maternal Grandmother   . Cancer Maternal Grandmother        lung  . Cancer Paternal Grandmother   . Leukemia Paternal Grandmother     Social History   Socioeconomic History  . Marital status: Married    Spouse name: Not on file  . Number of children: Not on file  . Years of education: Not on file  . Highest education level: Not on file  Occupational History  . Not on file  Tobacco Use  . Smoking status: Never Smoker  . Smokeless tobacco: Never  Used  Substance and Sexual Activity  . Alcohol use: No    Comment: Pt denies  . Drug use: No    Comment: Pt denies  . Sexual activity: Not on file  Other Topics Concern  . Not on file  Social History Narrative  . Not on file   Social Determinants of Health   Financial Resource Strain:   . Difficulty of Paying Living Expenses:   Food Insecurity:   . Worried About Charity fundraiser in the Last Year:   . Arboriculturist in the Last Year:   Transportation Needs:   . Film/video editor (Medical):   Marland Kitchen Lack of Transportation (Non-Medical):   Physical Activity:   . Days of Exercise per Week:   . Minutes of Exercise per Session:   Stress:   . Feeling of Stress :   Social Connections:   . Frequency of Communication with Friends and Family:   . Frequency of Social Gatherings with Friends and Family:   . Attends Religious Services:   . Active Member of Clubs or Organizations:   . Attends Archivist Meetings:   Marland Kitchen Marital Status:   Intimate Partner Violence:   . Fear of Current or Ex-Partner:   . Emotionally Abused:   Marland Kitchen Physically Abused:   . Sexually Abused:     Review of Systems: See HPI, otherwise negative ROS  Physical Exam: BP (!) 153/96   Pulse 85   Temp 97.9 F (36.6 C) (Temporal)   Resp 18   Ht 5\' 4"  (1.626 m)   Wt 98.9 kg   LMP 09/08/2011   SpO2 98%   BMI 37.42 kg/m  General:   Alert,  pleasant and cooperative in NAD Head:  Normocephalic and atraumatic. Neck:  Supple; no masses or thyromegaly. Lungs:  Clear throughout to auscultation, normal respiratory effort.    Heart:  +S1, +S2, Regular rate and rhythm, No edema. Abdomen:  Soft, nontender and nondistended. Normal bowel sounds, without guarding, and without rebound.   Neurologic:  Alert and  oriented x4;  grossly normal neurologically.  Impression/Plan: Katherine Carpenter is here for an EGD for abdominal pain  Risks, benefits, limitations, and alternatives regarding the procedure have  been reviewed with the patient.  Questions have been answered.  All parties agreeable.   Virgel Manifold, MD  10/27/2019, 10:36 AM

## 2019-10-28 LAB — SURGICAL PATHOLOGY

## 2019-10-29 ENCOUNTER — Encounter: Payer: Self-pay | Admitting: *Deleted

## 2019-10-31 ENCOUNTER — Encounter: Payer: Self-pay | Admitting: Gastroenterology

## 2019-11-20 ENCOUNTER — Ambulatory Visit (INDEPENDENT_AMBULATORY_CARE_PROVIDER_SITE_OTHER): Payer: Medicare Other | Admitting: Gastroenterology

## 2019-11-20 ENCOUNTER — Encounter: Payer: Self-pay | Admitting: Gastroenterology

## 2019-11-20 ENCOUNTER — Other Ambulatory Visit: Payer: Self-pay

## 2019-11-20 VITALS — BP 149/84 | HR 125 | Temp 98.3°F | Wt 221.2 lb

## 2019-11-20 DIAGNOSIS — K219 Gastro-esophageal reflux disease without esophagitis: Secondary | ICD-10-CM | POA: Diagnosis not present

## 2019-11-20 MED ORDER — OMEPRAZOLE 20 MG PO CPDR
20.0000 mg | DELAYED_RELEASE_CAPSULE | Freq: Every day | ORAL | 1 refills | Status: DC
Start: 2019-11-20 — End: 2020-02-02

## 2019-11-20 NOTE — Patient Instructions (Addendum)
Please remember to decrease the dosing of your Omeprazole 20 MG 1 capsule daily for the next 60 days. Then you will stop taking them. We will see you back in a year.  Gastroesophageal Reflux Disease, Adult Gastroesophageal reflux (GER) happens when acid from the stomach flows up into the tube that connects the mouth and the stomach (esophagus). Normally, food travels down the esophagus and stays in the stomach to be digested. With GER, food and stomach acid sometimes move back up into the esophagus. You may have a disease called gastroesophageal reflux disease (GERD) if the reflux:  Happens often.  Causes frequent or very bad symptoms.  Causes problems such as damage to the esophagus. When this happens, the esophagus becomes sore and swollen (inflamed). Over time, GERD can make small holes (ulcers) in the lining of the esophagus. What are the causes? This condition is caused by a problem with the muscle between the esophagus and the stomach. When this muscle is weak or not normal, it does not close properly to keep food and acid from coming back up from the stomach. The muscle can be weak because of:  Tobacco use.  Pregnancy.  Having a certain type of hernia (hiatal hernia).  Alcohol use.  Certain foods and drinks, such as coffee, chocolate, onions, and peppermint. What increases the risk? You are more likely to develop this condition if you:  Are overweight.  Have a disease that affects your connective tissue.  Use NSAID medicines. What are the signs or symptoms? Symptoms of this condition include:  Heartburn.  Difficult or painful swallowing.  The feeling of having a lump in the throat.  A bitter taste in the mouth.  Bad breath.  Having a lot of saliva.  Having an upset or bloated stomach.  Belching.  Chest pain. Different conditions can cause chest pain. Make sure you see your doctor if you have chest pain.  Shortness of breath or noisy breathing  (wheezing).  Ongoing (chronic) cough or a cough at night.  Wearing away of the surface of teeth (tooth enamel).  Weight loss. How is this treated? Treatment will depend on how bad your symptoms are. Your doctor may suggest:  Changes to your diet.  Medicine.  Surgery. Follow these instructions at home: Eating and drinking   Follow a diet as told by your doctor. You may need to avoid foods and drinks such as: ? Coffee and tea (with or without caffeine). ? Drinks that contain alcohol. ? Energy drinks and sports drinks. ? Bubbly (carbonated) drinks or sodas. ? Chocolate and cocoa. ? Peppermint and mint flavorings. ? Garlic and onions. ? Horseradish. ? Spicy and acidic foods. These include peppers, chili powder, curry powder, vinegar, hot sauces, and BBQ sauce. ? Citrus fruit juices and citrus fruits, such as oranges, lemons, and limes. ? Tomato-based foods. These include red sauce, chili, salsa, and pizza with red sauce. ? Fried and fatty foods. These include donuts, french fries, potato chips, and high-fat dressings. ? High-fat meats. These include hot dogs, rib eye steak, sausage, ham, and bacon. ? High-fat dairy items, such as whole milk, butter, and cream cheese.  Eat small meals often. Avoid eating large meals.  Avoid drinking large amounts of liquid with your meals.  Avoid eating meals during the 2-3 hours before bedtime.  Avoid lying down right after you eat.  Do not exercise right after you eat. Lifestyle   Do not use any products that contain nicotine or tobacco. These include cigarettes, e-cigarettes,  and chewing tobacco. If you need help quitting, ask your doctor.  Try to lower your stress. If you need help doing this, ask your doctor.  If you are overweight, lose an amount of weight that is healthy for you. Ask your doctor about a safe weight loss goal. General instructions  Pay attention to any changes in your symptoms.  Take over-the-counter and  prescription medicines only as told by your doctor. Do not take aspirin, ibuprofen, or other NSAIDs unless your doctor says it is okay.  Wear loose clothes. Do not wear anything tight around your waist.  Raise (elevate) the head of your bed about 6 inches (15 cm).  Avoid bending over if this makes your symptoms worse.  Keep all follow-up visits as told by your doctor. This is important. Contact a doctor if:  You have new symptoms.  You lose weight and you do not know why.  You have trouble swallowing or it hurts to swallow.  You have wheezing or a cough that keeps happening.  Your symptoms do not get better with treatment.  You have a hoarse voice. Get help right away if:  You have pain in your arms, neck, jaw, teeth, or back.  You feel sweaty, dizzy, or light-headed.  You have chest pain or shortness of breath.  You throw up (vomit) and your throw-up looks like blood or coffee grounds.  You pass out (faint).  Your poop (stool) is bloody or black.  You cannot swallow, drink, or eat. Summary  If a person has gastroesophageal reflux disease (GERD), food and stomach acid move back up into the esophagus and cause symptoms or problems such as damage to the esophagus.  Treatment will depend on how bad your symptoms are.  Follow a diet as told by your doctor.  Take all medicines only as told by your doctor. This information is not intended to replace advice given to you by your health care provider. Make sure you discuss any questions you have with your health care provider. Document Revised: 12/05/2017 Document Reviewed: 12/05/2017 Elsevier Patient Education  [employer].

## 2019-11-21 NOTE — Progress Notes (Signed)
Vonda Antigua, MD [address]  [place]  [place], [place] [address]  Main: [phone]  Fax: [phone]   Primary Care Physician: Pleas Koch, NP   Chief Complaint  Patient presents with  . Gastroesophageal Reflux    Patient is here today to go over her results.    HPI: Katherine Carpenter is a 59 y.o. female here for follow-up of reflux.  Symptoms now well controlled with PPI. The patient denies abdominal or flank pain, anorexia, nausea or vomiting, dysphagia, change in bowel habits or black or bloody stools or weight loss.  EGD showed salmon-colored mucosa that was biopsied in the esophagus.  Gastric erythema.  Otherwise normal.  Pathology was benign.  Current Outpatient Medications  Medication Sig Dispense Refill  . ALPRAZolam (XANAX) 1 MG tablet Take 1 mg by mouth 5 (five) times daily. Patient may take up to 5 times a day    . amphetamine-dextroamphetamine (ADDERALL XR) 20 MG 24 hr capsule Take 20 mg by mouth daily.    Marland Kitchen augmented betamethasone dipropionate (DIPROLENE-AF) 0.05 % cream Apply topically 2 (two) times daily.    Marland Kitchen buPROPion (WELLBUTRIN XL) 150 MG 24 hr tablet Take 150 mg by mouth daily.    . Calcium Carbonate-Vitamin D (OYSTER SHELL CALCIUM 500 + D) 500-125 MG-UNIT TABS Take by mouth.    . carbamazepine (EQUETRO) 200 MG CP12 Take 800 mg by mouth at bedtime.    . carbamazepine (TEGRETOL) 200 MG tablet TAKE ONE TABLET BY MOUTH EVERY MORNING AND THREE TABLETS AT BEDTIME    . doxycycline (VIBRAMYCIN) 100 MG capsule Take 100 mg by mouth daily.    . fluticasone (FLONASE) 50 MCG/ACT nasal spray Place 1 spray into both nostrils 2 (two) times daily. 16 g 0  . hydrochlorothiazide (HYDRODIURIL) 25 MG tablet Take 12.5 mg by mouth daily.    Marland Kitchen lamoTRIgine (LAMICTAL) 200 MG tablet Take 100-200 mg by mouth 2 (two) times daily. 1/2 tablet every morning and 1 tablet every evening    . LATUDA 60 MG TABS Take 1 tablet by mouth daily.  4  . metoprolol succinate  (TOPROL-XL) 25 MG 24 hr tablet TAKE 1/2 TABLET BY MOUTH EVERY DAY 45 tablet 3  . metroNIDAZOLE (METROCREAM) 0.75 % cream Apply topically.    . Multiple Vitamin (MULTI-VITAMINS) TABS Take by mouth.    . naproxen (NAPROSYN) 500 MG tablet Take 1 tablet (500 mg total) by mouth 2 (two) times daily with a meal. 14 tablet 0  . olanzapine-FLUoxetine (SYMBYAX) 12-25 MG per capsule Take 1 capsule by mouth every evening.    . ondansetron (ZOFRAN ODT) 4 MG disintegrating tablet Take 1 tablet (4 mg total) by mouth every 8 (eight) hours as needed for nausea or vomiting. 15 tablet 0  . QUEtiapine (SEROQUEL XR) 200 MG 24 hr tablet Take 1,000 mg by mouth at bedtime. Patient takes 5 tablets by mouth every night at bedtime    . simvastatin (ZOCOR) 10 MG tablet TAKE 1 TABLET BY MOUTH DAILY 90 tablet 2  . omeprazole (PRILOSEC) 20 MG capsule Take 1 capsule (20 mg total) by mouth daily. 30 capsule 1   No current facility-administered medications for this visit.    Allergies as of 11/20/2019  . (No Known Allergies)    ROS:  General: Negative for anorexia, weight loss, fever, chills, fatigue, weakness. ENT: Negative for hoarseness, difficulty swallowing , nasal congestion. CV: Negative for chest pain, angina, palpitations, dyspnea on exertion, peripheral edema.  Respiratory: Negative  for dyspnea at rest, dyspnea on exertion, cough, sputum, wheezing.  GI: See history of present illness. GU:  Negative for dysuria, hematuria, urinary incontinence, urinary frequency, nocturnal urination.  Endo: Negative for unusual weight change.    Physical Examination:   BP (!) 149/84   Pulse (!) 125   Temp 98.3 F (36.8 C) (Oral)   Wt 221 lb 3.2 oz (100.3 kg)   LMP 09/08/2011   BMI 37.97 kg/m   General: Well-nourished, well-developed in no acute distress.  Eyes: No icterus. Conjunctivae pink. Mouth: Oropharyngeal mucosa moist and pink , no lesions erythema or exudate. Neck: Supple, Trachea midline Abdomen: Bowel  sounds are normal, nontender, nondistended, no hepatosplenomegaly or masses, no abdominal bruits or hernia , no rebound or guarding.   Extremities: No lower extremity edema. No clubbing or deformities. Neuro: Alert and oriented x 3.  Grossly intact. Skin: Warm and dry, no jaundice.   Psych: Alert and cooperative, normal mood and affect.   Labs: CMP     Component Value Date/Time   NA 139 10/15/2019 1453   K 3.8 10/15/2019 1453   CL 100 10/15/2019 1453   CO2 33 (H) 10/15/2019 1453   GLUCOSE 121 (H) 10/15/2019 1453   BUN 26 (H) 10/15/2019 1453   CREATININE 1.03 10/15/2019 1453   CALCIUM 9.5 10/15/2019 1453   PROT 6.7 04/01/2019 0931   ALBUMIN 4.3 04/01/2019 0931   AST 18 04/01/2019 0931   ALT 21 04/01/2019 0931   ALKPHOS 99 04/01/2019 0931   BILITOT 0.3 04/01/2019 0931   GFRNONAA 81 (L) 09/10/2011 0840   GFRAA >90 09/10/2011 0840   Lab Results  Component Value Date   WBC 9.5 10/15/2019   HGB 13.6 10/15/2019   HCT 40.8 10/15/2019   MCV 88.5 10/15/2019   PLT 268.0 10/15/2019    Imaging Studies: No results found.  Assessment and Plan:   Katherine Carpenter is a 59 y.o. y/o female here for follow-up of reflux  Decrease omeprazole to 20 mg once daily.  Well-controlled with 40 mg once daily at this time.  However, if symptoms worsens on the lower dose, patient advised to call us  Patient educated extensively on acid reflux lifestyle modification, including buying a bed wedge, not eating 3 hrs before bedtime, diet modifications, and handout given for the same.   Discontinue PPI after 1 to 2 months and if symptoms return, patient advised to call us at that time as well  (Risks of PPI use were discussed with patient including bone loss, C. Diff diarrhea, pneumonia, infections, CKD, electrolyte abnormalities.  Pt. Verbalizes understanding and chooses to continue the medication.)     Dr Vonda Antigua

## 2019-12-03 ENCOUNTER — Other Ambulatory Visit: Payer: Self-pay | Admitting: Gastroenterology

## 2019-12-03 DIAGNOSIS — K219 Gastro-esophageal reflux disease without esophagitis: Secondary | ICD-10-CM

## 2019-12-05 ENCOUNTER — Ambulatory Visit: Payer: Self-pay | Admitting: Diagnostic Neuroimaging

## 2019-12-07 ENCOUNTER — Other Ambulatory Visit: Payer: Self-pay | Admitting: Primary Care

## 2019-12-14 ENCOUNTER — Other Ambulatory Visit: Payer: Self-pay | Admitting: Gastroenterology

## 2020-01-27 ENCOUNTER — Ambulatory Visit: Payer: Medicare Other | Admitting: Diagnostic Neuroimaging

## 2020-02-02 ENCOUNTER — Encounter: Payer: Self-pay | Admitting: Family Medicine

## 2020-02-02 ENCOUNTER — Other Ambulatory Visit: Payer: Self-pay

## 2020-02-02 ENCOUNTER — Ambulatory Visit (INDEPENDENT_AMBULATORY_CARE_PROVIDER_SITE_OTHER): Payer: Medicare Other | Admitting: Family Medicine

## 2020-02-02 VITALS — BP 148/82 | HR 92 | Temp 98.2°F | Ht 64.0 in | Wt 230.0 lb

## 2020-02-02 DIAGNOSIS — R1032 Left lower quadrant pain: Secondary | ICD-10-CM | POA: Diagnosis not present

## 2020-02-02 DIAGNOSIS — R110 Nausea: Secondary | ICD-10-CM

## 2020-02-02 DIAGNOSIS — R109 Unspecified abdominal pain: Secondary | ICD-10-CM

## 2020-02-02 DIAGNOSIS — R310 Gross hematuria: Secondary | ICD-10-CM | POA: Diagnosis not present

## 2020-02-02 DIAGNOSIS — R300 Dysuria: Secondary | ICD-10-CM | POA: Diagnosis not present

## 2020-02-02 LAB — POC URINALSYSI DIPSTICK (AUTOMATED)
Bilirubin, UA: NEGATIVE
Glucose, UA: NEGATIVE
Ketones, UA: NEGATIVE
Leukocytes, UA: NEGATIVE
Nitrite, UA: NEGATIVE
Protein, UA: NEGATIVE
Spec Grav, UA: 1.015 (ref 1.010–1.025)
Urobilinogen, UA: 0.2 E.U./dL
pH, UA: 7 (ref 5.0–8.0)

## 2020-02-02 MED ORDER — KETOROLAC TROMETHAMINE 30 MG/ML IJ SOLN
30.0000 mg | Freq: Once | INTRAMUSCULAR | Status: AC
  Administered 2020-02-02: 30 mg via INTRAMUSCULAR

## 2020-02-02 MED ORDER — ONDANSETRON 4 MG PO TBDP
8.0000 mg | ORAL_TABLET | Freq: Once | ORAL | Status: AC
  Administered 2020-02-02: 8 mg via ORAL

## 2020-02-02 MED ORDER — ONDANSETRON 8 MG PO TBDP: 8.0000 mg | ORAL_TABLET | Freq: Three times a day (TID) | ORAL | 0 refills | Status: DC | PRN

## 2020-02-02 NOTE — Telephone Encounter (Signed)
FYI. I have reply to patient to schedule an appointment but just noticed this message

## 2020-02-02 NOTE — Patient Instructions (Addendum)
Good to see you today  When you get home, please take two dulcolax (or generic equivalent) and 1 hour later take 2 doses of Miralax over 30 minutes. Must have total 16 ounces of water with two doses of Miralax.

## 2020-02-02 NOTE — Progress Notes (Signed)
Subjective:    Patient ID: Danella Sensing, female    DOB: [birth year], 59 y.o.   MRN: [id number]  HPI Chief Complaint  Patient presents with  . Flank Pain    left side pain and nausea started yesterday. Constipation 1 week.   This is a 59 yo female who presents today with above cc. Has had intermittent stomach pain and constipation off and on for a long time. Has had GI eval. Not currently taking anything for constipation.    Had small bm this am, three days ago had small, hard bowel movement.   Left sided back pain started this morning, sharp and constant. Has not been drinking any water, has only been drinking Diet Coke, 2 bottles.      Review of Systems No fever, chills, myalgias.     Objective:   Physical Exam Vitals reviewed.  Constitutional:      Appearance: She is obese. She is ill-appearing (appears uncomfortable).  HENT:     Head: Normocephalic and atraumatic.  Cardiovascular:     Rate and Rhythm: Normal rate and regular rhythm.     Heart sounds: Normal heart sounds.  Pulmonary:     Effort: Pulmonary effort is normal.     Breath sounds: Normal breath sounds.  Abdominal:     General: There is distension.     Palpations: There is no mass.     Tenderness: There is abdominal tenderness (diffuse, L>R). There is left CVA tenderness. There is no guarding or rebound.     Hernia: No hernia is present.  Skin:    General: Skin is warm and dry.  Neurological:     Mental Status: She is alert and oriented to person, place, and time.  Psychiatric:        Mood and Affect: Mood normal.        Behavior: Behavior normal.        Thought Content: Thought content normal.        Judgment: Judgment normal.          BP (!) 148/82   Pulse 92   Temp 98.2 F (36.8 C) (Temporal)   Ht 5\' 4"  (1.626 m)   Wt 230 lb (104.3 kg)   LMP 09/08/2011   SpO2 97%   BMI 39.48 kg/m  Wt Readings from Last 3 Encounters:  02/02/20 230 lb (104.3 kg)  11/20/19 221 lb 3.2 oz (100.3 kg)    10/27/19 218 lb (98.9 kg)   Results for orders placed or performed in visit on 02/02/20  POCT Urinalysis Dipstick (Automated)  Result Value Ref Range   Color, UA amber    Clarity, UA cloudy    Glucose, UA Negative Negative   Bilirubin, UA neg    Ketones, UA neg    Spec Grav, UA 1.015 1.010 - 1.025   Blood, UA 3+    pH, UA 7.0 5.0 - 8.0   Protein, UA Negative Negative   Urobilinogen, UA 0.2 0.2 or 1.0 E.U./dL   Nitrite, UA neg    Leukocytes, UA Negative Negative     Assessment & Plan:  1. Flank pain - unclear etiology, DD to include kidney stone, diverticulitis - POCT Urinalysis Dipstick (Automated) - CT Abdomen Pelvis Wo Contrast; Future - Urine Culture - toradol 30 mg IM x 1  2. Nausea without vomiting - ondansetron (ZOFRAN-ODT) disintegrating tablet 8 mg  3. Left lower quadrant abdominal pain - CT Abdomen Pelvis Wo Contrast; Future - Comprehensive metabolic panel - CBC  with Differential - Lipase  4. Dysuria - CT Abdomen Pelvis Wo Contrast; Future - Urine Culture  5. Gross hematuria - Urine Culture  - ER precautions reviewed   This visit occurred during the SARS-CoV-2 public health emergency.  Safety protocols were in place, including screening questions prior to the visit, additional usage of staff PPE, and extensive cleaning of exam room while observing appropriate contact time as indicated for disinfecting solutions.    Clarene Reamer, FNP-BC  [facility]  02/02/2020 4:37 PM

## 2020-02-03 ENCOUNTER — Ambulatory Visit
Admission: RE | Admit: 2020-02-03 | Discharge: 2020-02-03 | Disposition: A | Discharge status: Home / Self Care | Payer: Medicare Other | Source: Ambulatory Visit | Attending: Family Medicine | Admitting: Family Medicine

## 2020-02-03 ENCOUNTER — Encounter: Payer: Self-pay | Admitting: Family Medicine

## 2020-02-03 ENCOUNTER — Other Ambulatory Visit: Payer: Self-pay | Admitting: Family Medicine

## 2020-02-03 DIAGNOSIS — I700 Atherosclerosis of aorta: Secondary | ICD-10-CM | POA: Diagnosis not present

## 2020-02-03 DIAGNOSIS — N201 Calculus of ureter: Secondary | ICD-10-CM | POA: Diagnosis not present

## 2020-02-03 DIAGNOSIS — N200 Calculus of kidney: Secondary | ICD-10-CM | POA: Diagnosis not present

## 2020-02-03 DIAGNOSIS — R109 Unspecified abdominal pain: Secondary | ICD-10-CM

## 2020-02-03 DIAGNOSIS — K5730 Diverticulosis of large intestine without perforation or abscess without bleeding: Secondary | ICD-10-CM | POA: Diagnosis not present

## 2020-02-03 DIAGNOSIS — R300 Dysuria: Secondary | ICD-10-CM

## 2020-02-03 DIAGNOSIS — R1032 Left lower quadrant pain: Secondary | ICD-10-CM

## 2020-02-03 LAB — COMPREHENSIVE METABOLIC PANEL
ALT: 19 U/L (ref 0–35)
AST: 18 U/L (ref 0–37)
Albumin: 4 g/dL (ref 3.5–5.2)
Alkaline Phosphatase: 94 U/L (ref 39–117)
BUN: 23 mg/dL (ref 6–23)
CO2: 27 mEq/L (ref 19–32)
Calcium: 9.4 mg/dL (ref 8.4–10.5)
Chloride: 103 mEq/L (ref 96–112)
Creatinine, Ser: 0.93 mg/dL (ref 0.40–1.20)
GFR: 61.69 mL/min (ref >60.00)
Glucose, Bld: 122 mg/dL — ABNORMAL HIGH (ref 70–99)
Potassium: 3.8 mEq/L (ref 3.5–5.1)
Sodium: 138 mEq/L (ref 135–145)
Total Bilirubin: 0.2 mg/dL (ref 0.2–1.2)
Total Protein: 6.8 g/dL (ref 6.0–8.3)

## 2020-02-03 LAB — CBC WITH DIFFERENTIAL/PLATELET
Basophils Absolute: 0.1 10*3/uL (ref 0.0–0.1)
Basophils Relative: 0.8 % (ref 0.0–3.0)
Eosinophils Absolute: 0.1 10*3/uL (ref 0.0–0.7)
Eosinophils Relative: 1.2 % (ref 0.0–5.0)
HCT: 39.6 % (ref 36.0–46.0)
Hemoglobin: 13.3 g/dL (ref 12.0–15.0)
Lymphocytes Relative: 15.8 % (ref 12.0–46.0)
Lymphs Abs: 1.4 10*3/uL (ref 0.7–4.0)
MCHC: 33.7 g/dL (ref 30.0–36.0)
MCV: 87.5 fl (ref 78.0–100.0)
Monocytes Absolute: 0.4 10*3/uL (ref 0.1–1.0)
Monocytes Relative: 5.2 % (ref 3.0–12.0)
Neutro Abs: 6.6 10*3/uL (ref 1.4–7.7)
Neutrophils Relative %: 77 % (ref 43.0–77.0)
Platelets: 240 10*3/uL (ref 150.0–400.0)
RBC: 4.53 Mil/uL (ref 3.87–5.11)
RDW: 13.7 % (ref 11.5–15.5)
WBC: 8.6 10*3/uL (ref 4.0–10.5)

## 2020-02-03 LAB — URINE CULTURE
MICRO NUMBER:: 10859296
SPECIMEN QUALITY:: ADEQUATE

## 2020-02-03 LAB — LIPASE: Lipase: 16 U/L (ref 11.0–59.0)

## 2020-02-03 MED ORDER — TAMSULOSIN HCL 0.4 MG PO CAPS: 0.4000 mg | ORAL_CAPSULE | Freq: Every day | ORAL | 3 refills | Status: DC

## 2020-02-04 DIAGNOSIS — I10 Essential (primary) hypertension: Secondary | ICD-10-CM

## 2020-02-04 DIAGNOSIS — E785 Hyperlipidemia, unspecified: Secondary | ICD-10-CM

## 2020-02-04 DIAGNOSIS — R2689 Other abnormalities of gait and mobility: Secondary | ICD-10-CM

## 2020-02-04 DIAGNOSIS — E669 Obesity, unspecified: Secondary | ICD-10-CM

## 2020-03-05 DIAGNOSIS — L309 Dermatitis, unspecified: Secondary | ICD-10-CM | POA: Diagnosis not present

## 2020-03-25 ENCOUNTER — Other Ambulatory Visit: Payer: Self-pay | Admitting: Neurology

## 2020-03-25 DIAGNOSIS — R2689 Other abnormalities of gait and mobility: Secondary | ICD-10-CM | POA: Diagnosis not present

## 2020-03-25 DIAGNOSIS — R296 Repeated falls: Secondary | ICD-10-CM | POA: Diagnosis not present

## 2020-03-25 DIAGNOSIS — Z79899 Other long term (current) drug therapy: Secondary | ICD-10-CM | POA: Diagnosis not present

## 2020-03-25 DIAGNOSIS — E519 Thiamine deficiency, unspecified: Secondary | ICD-10-CM | POA: Diagnosis not present

## 2020-03-25 DIAGNOSIS — E531 Pyridoxine deficiency: Secondary | ICD-10-CM | POA: Diagnosis not present

## 2020-03-25 DIAGNOSIS — E559 Vitamin D deficiency, unspecified: Secondary | ICD-10-CM | POA: Diagnosis not present

## 2020-04-09 DIAGNOSIS — L719 Rosacea, unspecified: Secondary | ICD-10-CM | POA: Diagnosis not present

## 2020-04-09 DIAGNOSIS — L308 Other specified dermatitis: Secondary | ICD-10-CM | POA: Diagnosis not present

## 2020-04-10 DIAGNOSIS — Z23 Encounter for immunization: Secondary | ICD-10-CM | POA: Diagnosis not present

## 2020-04-18 ENCOUNTER — Ambulatory Visit
Admission: RE | Admit: 2020-04-18 | Discharge: 2020-04-18 | Disposition: A | Discharge status: Home / Self Care | Payer: Medicare Other | Source: Ambulatory Visit | Attending: Neurology | Admitting: Neurology

## 2020-04-18 DIAGNOSIS — R2689 Other abnormalities of gait and mobility: Secondary | ICD-10-CM

## 2020-04-18 DIAGNOSIS — R296 Repeated falls: Secondary | ICD-10-CM | POA: Diagnosis not present

## 2020-04-18 MED ORDER — GADOBENATE DIMEGLUMINE 529 MG/ML IV SOLN
20.0000 mL | Freq: Once | INTRAVENOUS | Status: AC | PRN
  Administered 2020-04-18: 20 mL via INTRAVENOUS

## 2020-05-04 DIAGNOSIS — R2689 Other abnormalities of gait and mobility: Secondary | ICD-10-CM | POA: Diagnosis not present

## 2020-05-05 DIAGNOSIS — E663 Overweight: Secondary | ICD-10-CM | POA: Diagnosis not present

## 2020-05-05 DIAGNOSIS — L309 Dermatitis, unspecified: Secondary | ICD-10-CM | POA: Diagnosis not present

## 2020-05-05 DIAGNOSIS — M19041 Primary osteoarthritis, right hand: Secondary | ICD-10-CM | POA: Diagnosis not present

## 2020-05-05 DIAGNOSIS — M19042 Primary osteoarthritis, left hand: Secondary | ICD-10-CM | POA: Diagnosis not present

## 2020-05-05 DIAGNOSIS — R700 Elevated erythrocyte sedimentation rate: Secondary | ICD-10-CM | POA: Diagnosis not present

## 2020-05-13 DIAGNOSIS — L308 Other specified dermatitis: Secondary | ICD-10-CM | POA: Diagnosis not present

## 2020-05-26 ENCOUNTER — Other Ambulatory Visit: Payer: Self-pay

## 2020-05-26 ENCOUNTER — Ambulatory Visit: Payer: 59 | Admitting: Primary Care

## 2020-05-26 ENCOUNTER — Ambulatory Visit (INDEPENDENT_AMBULATORY_CARE_PROVIDER_SITE_OTHER): Payer: Medicare Other | Admitting: Primary Care

## 2020-05-26 ENCOUNTER — Encounter: Payer: Self-pay | Admitting: Primary Care

## 2020-05-26 DIAGNOSIS — H9203 Otalgia, bilateral: Secondary | ICD-10-CM | POA: Diagnosis not present

## 2020-05-26 DIAGNOSIS — R2689 Other abnormalities of gait and mobility: Secondary | ICD-10-CM | POA: Diagnosis not present

## 2020-05-26 NOTE — Patient Instructions (Signed)
Nasal Congestion/Ear Pressure: Try using Flonase (fluticasone) nasal spray. Instill 1 spray in each nostril twice daily.   Continue loratadine daily. You can switch to cetirizine (Zyrtec), this may be more effective.   Please update me in 1 week.   It was a pleasure to see you today!

## 2020-05-26 NOTE — Progress Notes (Signed)
Subjective:    Patient ID: Katherine Carpenter, female    DOB: 1960-10-22, 59 y.o.   MRN: [id number]  HPI  This visit occurred during the SARS-CoV-2 public health emergency.  Safety protocols were in place, including screening questions prior to the visit, additional usage of staff PPE, and extensive cleaning of exam room while observing appropriate contact time as indicated for disinfecting solutions.   Ms. Fesler is a 59 year old female with a history of hypertension, GERD, Covid-19 infection who presents today with a chief complaint of otalgia.  Her pain is located to the bilateral ears which began a few months ago. Mostly itching, some pain. She had a bout of vertigo (room spinning) two days ago, lasted all day, had to hold onto objects to walk, this has resolved.   She's had a six month history of imbalance that has improved. She was evaluated by neurology several months ago for imbalance and falls, negative MRI brain, EMG testing negative, unable to determine cause for falls and imbalance. She denies recent falls, balance has improved.  She is taking loratadine daily, she does not use a nasal spray. She denies cough, PND, itchy/watery eyes.   Review of Systems  Constitutional: Negative for fever.  HENT: Positive for ear pain. Negative for sore throat.        Ear itching.  Vertigo two days ago  Eyes: Negative for redness and itching.  Respiratory: Negative for cough.        Past Medical History:  Diagnosis Date   Depression    Hypertension      Social History   Socioeconomic History   Marital status: Married    Spouse name: Not on file   Number of children: Not on file   Years of education: Not on file   Highest education level: Not on file  Occupational History   Not on file  Tobacco Use   Smoking status: Never Smoker   Smokeless tobacco: Never Used  Substance and Sexual Activity   Alcohol use: No    Comment: Pt denies   Drug use: No    Comment: Pt  denies   Sexual activity: Not on file  Other Topics Concern   Not on file  Social History Narrative   Not on file   Social Determinants of Health   Financial Resource Strain: Not on file  Food Insecurity: Not on file  Transportation Needs: Not on file  Physical Activity: Not on file  Stress: Not on file  Social Connections: Not on file  Intimate Partner Violence: Not on file    Past Surgical History:  Procedure Laterality Date   CERVICAL BIOPSY  W/ LOOP ELECTRODE EXCISION  2016   ESOPHAGOGASTRODUODENOSCOPY (EGD) WITH PROPOFOL N/A 10/27/2019   Procedure: ESOPHAGOGASTRODUODENOSCOPY (EGD) WITH PROPOFOL;  Surgeon: Virgel Manifold, MD;  Location: [facility] ENDOSCOPY;  Service: Endoscopy;  Laterality: N/A;   TUBAL LIGATION      Family History  Problem Relation Age of Onset   Asthma Mother    Depression Daughter    Arthritis Maternal Grandmother    Cancer Maternal Grandmother        lung   Cancer Paternal Grandmother    Leukemia Paternal Grandmother     No Known Allergies  Current Outpatient Medications on File Prior to Visit  Medication Sig Dispense Refill   ALPRAZolam (XANAX) 1 MG tablet Take 1 mg by mouth 5 (five) times daily. Patient may take up to 5 times a day  amphetamine-dextroamphetamine (ADDERALL XR) 20 MG 24 hr capsule Take 20 mg by mouth daily.     augmented betamethasone dipropionate (DIPROLENE-AF) 0.05 % cream Apply topically 2 (two) times daily.     buPROPion (WELLBUTRIN XL) 150 MG 24 hr tablet Take 150 mg by mouth daily.     Calcium Carbonate-Vitamin D 500-125 MG-UNIT TABS Take by mouth.     carbamazepine (TEGRETOL) 200 MG tablet TAKE ONE TABLET BY MOUTH EVERY MORNING AND THREE TABLETS AT BEDTIME     doxycycline (VIBRAMYCIN) 100 MG capsule Take 100 mg by mouth daily.     Dupilumab (DUPIXENT) 300 MG/2ML SOPN      fluticasone (FLONASE) 50 MCG/ACT nasal spray Place 1 spray into both nostrils 2 (two) times daily. 16 g 0    hydrochlorothiazide (HYDRODIURIL) 25 MG tablet Take 12.5 mg by mouth daily.     lamoTRIgine (LAMICTAL) 200 MG tablet Take 100-200 mg by mouth 2 (two) times daily. 1/2 tablet every morning and 1 tablet every evening     metoprolol succinate (TOPROL-XL) 25 MG 24 hr tablet TAKE 1/2 TABLET BY MOUTH EVERY DAY 45 tablet 3   metroNIDAZOLE (METROCREAM) 0.75 % cream Apply topically.     Multiple Vitamin (MULTI-VITAMINS) TABS Take by mouth.     olanzapine-FLUoxetine (SYMBYAX) 12-25 MG per capsule Take 1 capsule by mouth every evening.     ondansetron (ZOFRAN-ODT) 8 MG disintegrating tablet Take 1 tablet (8 mg total) by mouth every 8 (eight) hours as needed for nausea. 20 tablet 0   QUEtiapine (SEROQUEL XR) 200 MG 24 hr tablet Take 1,000 mg by mouth at bedtime. Patient takes 5 tablets by mouth every night at bedtime     simvastatin (ZOCOR) 10 MG tablet TAKE 1 TABLET BY MOUTH DAILY 90 tablet 2   tamsulosin (FLOMAX) 0.4 MG CAPS capsule Take 1 capsule (0.4 mg total) by mouth daily. 30 capsule 3   No current facility-administered medications on file prior to visit.    BP 136/82    Pulse (!) 113    Temp 97.6 F (36.4 C) (Temporal)    Ht 5\' 4"  (1.626 m)    Wt 236 lb (107 kg)    LMP 09/08/2011    SpO2 (!) 65%    BMI 40.51 kg/m    Objective:   Physical Exam HENT:     Right Ear: Ear canal normal. Tympanic membrane is bulging. Tympanic membrane is not erythematous.     Left Ear: Tympanic membrane and ear canal normal. Tympanic membrane is not erythematous or bulging.     Ears:     Comments: No evidence of eczema inside canals. Mild fluid build up in right TM. Neurological:     Mental Status: She is alert.     Comments: Ambulates well in office.            Assessment & Plan:

## 2020-05-26 NOTE — Assessment & Plan Note (Signed)
Improved, no recent falls. Negative work up completed per Neuro. Continue to monitor.

## 2020-05-26 NOTE — Assessment & Plan Note (Signed)
With itching.  Discussed switching Claritin to Zyrtec. Start Flonase. If no improvement then send to ENT.  No evidence of infection or eczema.

## 2020-05-28 DIAGNOSIS — L309 Dermatitis, unspecified: Secondary | ICD-10-CM

## 2020-05-31 ENCOUNTER — Telehealth: Payer: Self-pay | Admitting: Family Medicine

## 2020-05-31 DIAGNOSIS — N200 Calculus of kidney: Secondary | ICD-10-CM

## 2020-05-31 NOTE — Telephone Encounter (Signed)
Last prescribed by Tor Netters on 02/03/2020 #90 with 1 refill. Last OV on 05/26/2020. No future appointment scheduled.

## 2020-05-31 NOTE — Telephone Encounter (Signed)
Katherine Carpenter, please notify patient that I just realized that it has been over 1 year since we caught up with her general health care maintenance.  I would like to see her for follow-up, please schedule her with Dew[facility] Hoes for her Medicare wellness visit.

## 2020-06-08 NOTE — Telephone Encounter (Signed)
Called patient to schedule AWV. Scheduled. Please advise for med refill.

## 2020-06-10 MED ORDER — TAMSULOSIN HCL 0.4 MG PO CAPS: 0.4000 mg | ORAL_CAPSULE | Freq: Every day | ORAL | 0 refills | Status: DC

## 2020-06-10 NOTE — Telephone Encounter (Signed)
30 day refill called in to last until f/u with our office.

## 2020-07-02 ENCOUNTER — Ambulatory Visit (INDEPENDENT_AMBULATORY_CARE_PROVIDER_SITE_OTHER): Payer: Medicare Other

## 2020-07-02 DIAGNOSIS — Z0000 Encounter for general adult medical examination without abnormal findings: Secondary | ICD-10-CM | POA: Diagnosis not present

## 2020-07-02 NOTE — Patient Instructions (Signed)
Katherine Carpenter , Thank you for taking time to come for your Medicare Wellness Visit. I appreciate your ongoing commitment to your health goals. Please review the following plan we discussed and let me know if I can assist you in the future.   Screening recommendations/referrals: Colonoscopy: Up to date, completed 10/18/2011, due 10/2021 Mammogram: Up to date, completed 09/05/2019, due 08/2020 Bone Density: at age 60 Recommended yearly ophthalmology/optometry visit for glaucoma screening and checkup Recommended yearly dental visit for hygiene and checkup  Vaccinations: Influenza vaccine: Up to date, completed 03/11/2020, due 01/2021 Pneumococcal vaccine: at age 63  Tdap vaccine: Up to date, completed 11/28/2012, due 11/2022 Shingles vaccine: Completed series  Covid-19: Completed series  Advanced directives: Advance directive discussed with you today. Even though you declined this today please call our office should you change your mind and we can give you the proper paperwork for you to fill out.  Conditions/risks identified: hypertension, hyperlipidemia  Next appointment: Follow up in one year for your annual wellness visit.   Preventive Care 40-64 Years, Female Preventive care refers to lifestyle choices and visits with your health care provider that can promote health and wellness. What does preventive care include?  A yearly physical exam. This is also called an annual well check.  Dental exams once or twice a year.  Routine eye exams. Ask your health care provider how often you should have your eyes checked.  Personal lifestyle choices, including:  Daily care of your teeth and gums.  Regular physical activity.  Eating a healthy diet.  Avoiding tobacco and drug use.  Limiting alcohol use.  Practicing safe sex.  Taking low-dose aspirin daily starting at age 41.  Taking vitamin and mineral supplements as recommended by your health care provider. What happens during an annual  well check? The services and screenings done by your health care provider during your annual well check will depend on your age, overall health, lifestyle risk factors, and family history of disease. Counseling  Your health care provider may ask you questions about your:  Alcohol use.  Tobacco use.  Drug use.  Emotional well-being.  Home and relationship well-being.  Sexual activity.  Eating habits.  Work and work Statistician.  Method of birth control.  Menstrual cycle.  Pregnancy history. Screening  You may have the following tests or measurements:  Height, weight, and BMI.  Blood pressure.  Lipid and cholesterol levels. These may be checked every 5 years, or more frequently if you are over 34 years old.  Skin check.  Lung cancer screening. You may have this screening every year starting at age 43 if you have a 30-pack-year history of smoking and currently smoke or have quit within the past 15 years.  Fecal occult blood test (FOBT) of the stool. You may have this test every year starting at age 35.  Flexible sigmoidoscopy or colonoscopy. You may have a sigmoidoscopy every 5 years or a colonoscopy every 10 years starting at age 39.  Hepatitis C blood test.  Hepatitis B blood test.  Sexually transmitted disease (STD) testing.  Diabetes screening. This is done by checking your blood sugar (glucose) after you have not eaten for a while (fasting). You may have this done every 1-3 years.  Mammogram. This may be done every 1-2 years. Talk to your health care provider about when you should start having regular mammograms. This may depend on whether you have a family history of breast cancer.  BRCA-related cancer screening. This may be done if  you have a family history of breast, ovarian, tubal, or peritoneal cancers.  Pelvic exam and Pap test. This may be done every 3 years starting at age 8. Starting at age 72, this may be done every 5 years if you have a Pap test in  combination with an HPV test.  Bone density scan. This is done to screen for osteoporosis. You may have this scan if you are at high risk for osteoporosis. Discuss your test results, treatment options, and if necessary, the need for more tests with your health care provider. Vaccines  Your health care provider may recommend certain vaccines, such as:  Influenza vaccine. This is recommended every year.  Tetanus, diphtheria, and acellular pertussis (Tdap, Td) vaccine. You may need a Td booster every 10 years.  Zoster vaccine. You may need this after age 56.  Pneumococcal 13-valent conjugate (PCV13) vaccine. You may need this if you have certain conditions and were not previously vaccinated.  Pneumococcal polysaccharide (PPSV23) vaccine. You may need one or two doses if you smoke cigarettes or if you have certain conditions. Talk to your health care provider about which screenings and vaccines you need and how often you need them. This information is not intended to replace advice given to you by your health care provider. Make sure you discuss any questions you have with your health care provider. Document Released: 06/25/2015 Document Revised: 02/16/2016 Document Reviewed: 03/30/2015 Elsevier Interactive Patient Education  2017 [place] Prevention in the Home Falls can cause injuries. They can happen to people of all ages. There are many things you can do to make your home safe and to help prevent falls. What can I do on the outside of my home?  Regularly fix the edges of walkways and driveways and fix any cracks.  Remove anything that might make you trip as you walk through a door, such as a raised step or threshold.  Trim any bushes or trees on the path to your home.  Use bright outdoor lighting.  Clear any walking paths of anything that might make someone trip, such as rocks or tools.  Regularly check to see if handrails are loose or broken. Make sure that both  sides of any steps have handrails.  Any raised decks and porches should have guardrails on the edges.  Have any leaves, snow, or ice cleared regularly.  Use sand or salt on walking paths during winter.  Clean up any spills in your garage right away. This includes oil or grease spills. What can I do in the bathroom?  Use night lights.  Install grab bars by the toilet and in the tub and shower. Do not use towel bars as grab bars.  Use non-skid mats or decals in the tub or shower.  If you need to sit down in the shower, use a plastic, non-slip stool.  Keep the floor dry. Clean up any water that spills on the floor as soon as it happens.  Remove soap buildup in the tub or shower regularly.  Attach bath mats securely with double-sided non-slip rug tape.  Do not have throw rugs and other things on the floor that can make you trip. What can I do in the bedroom?  Use night lights.  Make sure that you have a light by your bed that is easy to reach.  Do not use any sheets or blankets that are too big for your bed. They should not hang down onto the floor.  Have a firm chair that has side arms. You can use this for support while you get dressed.  Do not have throw rugs and other things on the floor that can make you trip. What can I do in the kitchen?  Clean up any spills right away.  Avoid walking on wet floors.  Keep items that you use a lot in easy-to-reach places.  If you need to reach something above you, use a strong step stool that has a grab bar.  Keep electrical cords out of the way.  Do not use floor polish or wax that makes floors slippery. If you must use wax, use non-skid floor wax.  Do not have throw rugs and other things on the floor that can make you trip. What can I do with my stairs?  Do not leave any items on the stairs.  Make sure that there are handrails on both sides of the stairs and use them. Fix handrails that are broken or loose. Make sure that  handrails are as long as the stairways.  Check any carpeting to make sure that it is firmly attached to the stairs. Fix any carpet that is loose or worn.  Avoid having throw rugs at the top or bottom of the stairs. If you do have throw rugs, attach them to the floor with carpet tape.  Make sure that you have a light switch at the top of the stairs and the bottom of the stairs. If you do not have them, ask someone to add them for you. What else can I do to help prevent falls?  Wear shoes that:  Do not have high heels.  Have rubber bottoms.  Are comfortable and fit you well.  Are closed at the toe. Do not wear sandals.  If you use a stepladder:  Make sure that it is fully opened. Do not climb a closed stepladder.  Make sure that both sides of the stepladder are locked into place.  Ask someone to hold it for you, if possible.  Clearly mark and make sure that you can see:  Any grab bars or handrails.  First and last steps.  Where the edge of each step is.  Use tools that help you move around (mobility aids) if they are needed. These include:  Canes.  Walkers.  Scooters.  Crutches.  Turn on the lights when you go into a dark area. Replace any light bulbs as soon as they burn out.  Set up your furniture so you have a clear path. Avoid moving your furniture around.  If any of your floors are uneven, fix them.  If there are any pets around you, be aware of where they are.  Review your medicines with your doctor. Some medicines can make you feel dizzy. This can increase your chance of falling. Ask your doctor what other things that you can do to help prevent falls. This information is not intended to replace advice given to you by your health care provider. Make sure you discuss any questions you have with your health care provider. Document Released: 03/25/2009 Document Revised: 11/04/2015 Document Reviewed: 07/03/2014 Elsevier Interactive Patient Education  2017  [employer].

## 2020-07-02 NOTE — Progress Notes (Signed)
Subjective:   Katherine Carpenter is a 60 y.o. female who presents for Medicare Annual (Subsequent) preventive examination.  Review of Systems: N/A      I connected with the patient today by telephone and verified that I am speaking with the correct person using two identifiers. Location patient: home Location nurse: work Persons participating in the telephone visit: patient, nurse.   I discussed the limitations, risks, security and privacy concerns of performing an evaluation and management service by telephone and the availability of in person appointments. I also discussed with the patient that there may be a patient responsible charge related to this service. The patient expressed understanding and verbally consented to this telephonic visit.        Cardiac Risk Factors include: hypertension;Other (see comment), Risk factor comments: hyperlipidemia     Objective:    Today's Vitals   There is no height or weight on file to calculate BMI.  Advanced Directives 07/02/2020 07/02/2020 10/27/2019  Does Patient Have a Medical Advance Directive? No No No  Would patient like information on creating a medical advance directive? No - Patient declined No - Patient declined Yes (MAU/Ambulatory/Procedural Areas - Information given)    Current Medications (verified) Outpatient Encounter Medications as of 07/02/2020  Medication Sig  . ALPRAZolam (XANAX) 1 MG tablet Take 1 mg by mouth 5 (five) times daily. Patient may take up to 5 times a day  . amphetamine-dextroamphetamine (ADDERALL XR) 20 MG 24 hr capsule Take 20 mg by mouth daily.  Marland Kitchen augmented betamethasone dipropionate (DIPROLENE-AF) 0.05 % cream Apply topically 2 (two) times daily.  Marland Kitchen buPROPion (WELLBUTRIN XL) 150 MG 24 hr tablet Take 150 mg by mouth daily.  . Calcium Carbonate-Vitamin D 500-125 MG-UNIT TABS Take by mouth.  . carbamazepine (TEGRETOL) 200 MG tablet TAKE ONE TABLET BY MOUTH EVERY MORNING AND THREE TABLETS AT BEDTIME  .  doxycycline (VIBRAMYCIN) 100 MG capsule Take 100 mg by mouth daily.  . Dupilumab ([place]) 300 MG/2ML SOPN   . fluticasone (FLONASE) 50 MCG/ACT nasal spray Place 1 spray into both nostrils 2 (two) times daily.  . hydrochlorothiazide (HYDRODIURIL) 25 MG tablet Take 12.5 mg by mouth daily.  Marland Kitchen lamoTRIgine (LAMICTAL) 200 MG tablet Take 100-200 mg by mouth 2 (two) times daily. 1/2 tablet every morning and 1 tablet every evening  . metoprolol succinate (TOPROL-XL) 25 MG 24 hr tablet TAKE 1/2 TABLET BY MOUTH EVERY DAY  . metroNIDAZOLE (METROCREAM) 0.75 % cream Apply topically.  . Multiple Vitamin (MULTI-VITAMINS) TABS Take by mouth.  . olanzapine-FLUoxetine (SYMBYAX) 12-25 MG per capsule Take 1 capsule by mouth every evening.  . ondansetron (ZOFRAN-ODT) 8 MG disintegrating tablet Take 1 tablet (8 mg total) by mouth every 8 (eight) hours as needed for nausea.  Marland Kitchen QUEtiapine (SEROQUEL XR) 200 MG 24 hr tablet Take 1,000 mg by mouth at bedtime. Patient takes 5 tablets by mouth every night at bedtime  . simvastatin (ZOCOR) 10 MG tablet TAKE 1 TABLET BY MOUTH DAILY  . tamsulosin (FLOMAX) 0.4 MG CAPS capsule Take 1 capsule (0.4 mg total) by mouth daily.   No facility-administered encounter medications on file as of 07/02/2020.    Allergies (verified) Patient has no known allergies.   History: Past Medical History:  Diagnosis Date  . Depression   . Hypertension    Past Surgical History:  Procedure Laterality Date  . CERVICAL BIOPSY  W/ LOOP ELECTRODE EXCISION  2016  . ESOPHAGOGASTRODUODENOSCOPY (EGD) WITH PROPOFOL N/A 10/27/2019   Procedure: ESOPHAGOGASTRODUODENOSCOPY (  EGD) WITH PROPOFOL;  Surgeon: Virgel Manifold, MD;  Location: [facility] ENDOSCOPY;  Service: Endoscopy;  Laterality: N/A;  . TUBAL LIGATION     Family History  Problem Relation Age of Onset  . Asthma Mother   . Depression Daughter   . Arthritis Maternal Grandmother   . Cancer Maternal Grandmother        lung  . Cancer Paternal  Grandmother   . Leukemia Paternal Grandmother    Social History   Socioeconomic History  . Marital status: Married    Spouse name: Not on file  . Number of children: Not on file  . Years of education: Not on file  . Highest education level: Not on file  Occupational History  . Not on file  Tobacco Use  . Smoking status: Never Smoker  . Smokeless tobacco: Never Used  Substance and Sexual Activity  . Alcohol use: No    Comment: Pt denies  . Drug use: No    Comment: Pt denies  . Sexual activity: Not on file  Other Topics Concern  . Not on file  Social History Narrative  . Not on file   Social Determinants of Health   Financial Resource Strain: Low Risk   . Difficulty of Paying Living Expenses: Not hard at all  Food Insecurity: No Food Insecurity  . Worried About Charity fundraiser in the Last Year: Never true  . Ran Out of Food in the Last Year: Never true  Transportation Needs: No Transportation Needs  . Lack of Transportation (Medical): No  . Lack of Transportation (Non-Medical): No  Physical Activity: Inactive  . Days of Exercise per Week: 0 days  . Minutes of Exercise per Session: 0 min  Stress: No Stress Concern Present  . Feeling of Stress : Not at all  Social Connections: Not on file    Tobacco Counseling Counseling given: Not Answered   Clinical Intake:  Pre-visit preparation completed: Yes  Pain : No/denies pain     Diabetes: No  How often do you need to have someone help you when you read instructions, pamphlets, or other written materials from your doctor or pharmacy?: 1 - Never What is the last grade level you completed in school?: 2 years of college  Diabetic: No Nutrition Risk Assessment:  Has the patient had any N/V/D within the last 2 months?  No  Does the patient have any non-healing wounds?  No  Has the patient had any unintentional weight loss or weight gain?  No   Diabetes:  Is the patient diabetic?  No  If diabetic, was a CBG  obtained today?  N/A Did the patient bring in their glucometer from home?  N/A How often do you monitor your CBG's? N/A.   Financial Strains and Diabetes Management:  Are you having any financial strains with the device, your supplies or your medication? N/A.  Does the patient want to be seen by Chronic Care Management for management of their diabetes?  N/A Would the patient like to be referred to a Nutritionist or for Diabetic Management?  N/A    Interpreter Needed?: No  Information entered by :: CJohnson, LPN   Activities of Daily Living In your present state of health, do you have any difficulty performing the following activities: 07/02/2020  Hearing? N  Vision? N  Difficulty concentrating or making decisions? N  Walking or climbing stairs? N  Dressing or bathing? N  Doing errands, shopping? N  Preparing Food and eating ?  N  Using the Toilet? N  In the past six months, have you accidently leaked urine? N  Do you have problems with loss of bowel control? N  Managing your Medications? N  Managing your Finances? N  Housekeeping or managing your Housekeeping? N  Some recent data might be hidden    Patient Care Team: Pleas Koch, NP as PCP - General (Internal Medicine) Vladimir Crofts, MD as Consulting Physician (Neurology)  Indicate any recent Medical Services you may have received from other than [facility] providers in the past year (date may be approximate).     Assessment:   This is a routine wellness examination for Cody.  Hearing/Vision screen  Hearing Screening   125Hz  250Hz  500Hz  1000Hz  2000Hz  3000Hz  4000Hz  6000Hz  8000Hz   Right ear:           Left ear:           Vision Screening Comments: Patient gets annual eye exams   Dietary issues and exercise activities discussed: Current Exercise Habits: The patient does not participate in regular exercise at present, Exercise limited by: None identified  Goals    . Patient Stated     07/02/2020, I will  maintain and continue medications as prescribed.       Depression Screen PHQ 2/9 Scores 07/02/2020 05/13/2018  PHQ - 2 Score 0 0  PHQ- 9 Score 0 -    Fall Risk Fall Risk  07/02/2020 05/13/2018  Falls in the past year? 1 0  Number falls in past yr: 1 0  Injury with Fall? 0 -  Risk for fall due to : Medication side effect -  Follow up Falls evaluation completed;Falls prevention discussed -    FALL RISK PREVENTION PERTAINING TO THE HOME:  Any stairs in or around the home? Yes  If so, are there any without handrails? No  Home free of loose throw rugs in walkways, pet beds, electrical cords, etc? Yes  Adequate lighting in your home to reduce risk of falls? Yes   ASSISTIVE DEVICES UTILIZED TO PREVENT FALLS:  Life alert? No  Use of a cane, walker or w/c? No  Grab bars in the bathroom? No  Shower chair or bench in shower? No  Elevated toilet seat or a handicapped toilet? No   TIMED UP AND GO:  Was the test performed? N/A telephone visit .   Cognitive Function: MMSE - Mini Mental State Exam 07/02/2020  Orientation to time 5  Orientation to Place 5  Registration 3  Attention/ Calculation 5  Recall 3  Language- repeat 1       Mini Cog  Mini-Cog screen was completed. Maximum score is 22. A value of 0 denotes this part of the MMSE was not completed or the patient failed this part of the Mini-Cog screening.  Immunizations Immunization History  Administered Date(s) Administered  . Influenza Inj Mdck Quad Pf 03/07/2018  . Influenza,inj,Quad PF,6+ Mos 02/01/2019  . Influenza-Unspecified 01/30/2017, 03/11/2020  . PFIZER(Purple Top)SARS-COV-2 Vaccination 08/29/2019, 09/23/2019, 12/10/2019  . Tdap 11/28/2012  . Zoster Recombinat (Shingrix) 12/03/2017, 04/07/2018    TDAP status: Up to date  Flu Vaccine status: Up to date  Pneumococcal vaccine status: N/A, due at age 36   Covid-19 vaccine status: Completed vaccines  Qualifies for Shingles Vaccine? Yes   Zostavax completed  No   Shingrix Completed?: Yes  Screening Tests Health Maintenance  Topic Date Due  . HIV Screening  Never done  . COVID-19 Vaccine (3 - Booster for [employer]  series) 06/10/2020  . PAP SMEAR-Modifier  08/01/2021  . MAMMOGRAM  09/04/2021  . COLONOSCOPY (Pts 45-55yrs Insurance coverage will need to be confirmed)  10/17/2021  . TETANUS/TDAP  11/29/2022  . INFLUENZA VACCINE  Completed  . Hepatitis C Screening  Completed    Health Maintenance  Health Maintenance Due  Topic Date Due  . HIV Screening  Never done  . COVID-19 Vaccine (3 - Booster for [employer] series) 06/10/2020    Colorectal cancer screening: Type of screening: Colonoscopy. Completed 10/18/2011. Repeat every 10 years  Mammogram status: Completed 09/05/2019. Repeat every year  Bone Density status: due at age 69   Lung Cancer Screening: (Low Dose CT Chest recommended if Age 50-80 years, 30 pack-year currently smoking OR have quit w/in 15years.) does not qualify.    Additional Screening:  Hepatitis C Screening: does qualify; Completed 05/06/2018  Vision Screening: Recommended annual ophthalmology exams for early detection of glaucoma and other disorders of the eye. Is the patient up to date with their annual eye exam?  Yes  Who is the provider or what is the name of the office in which the patient attends annual eye exams? Dr. Edison Pace, [facility]  If pt is not established with a provider, would they like to be referred to a provider to establish care? No .   Dental Screening: Recommended annual dental exams for proper oral hygiene  Community Resource Referral / Chronic Care Management: CRR required this visit?  No   CCM required this visit?  No      Plan:     I have personally reviewed and noted the following in the patient's chart:   . Medical and social history . Use of alcohol, tobacco or illicit drugs  . Current medications and supplements . Functional ability and status . Nutritional  status . Physical activity . Advanced directives . List of other physicians . Hospitalizations, surgeries, and ER visits in previous 12 months . Vitals . Screenings to include cognitive, depression, and falls . Referrals and appointments  In addition, I have reviewed and discussed with patient certain preventive protocols, quality metrics, and best practice recommendations. A written personalized care plan for preventive services as well as general preventive health recommendations were provided to patient.   Due to this being a telephonic visit, the after visit summary with patients personalized plan was offered to patient via office or my-chart. Patient preferred to pick up at office at next visit or via mychart.   Andrez Grime, LPN   [id number]

## 2020-07-02 NOTE — Progress Notes (Signed)
PCP notes:  Health Maintenance: No gaps noted   Abnormal Screenings: none   Patient concerns: Stomach discomfort in lower abdomen after eating   Nurse concerns: none   Next PCP appt.: 07/09/2020 @ 7:40 am

## 2020-07-09 ENCOUNTER — Encounter: Payer: Self-pay | Admitting: Primary Care

## 2020-07-09 ENCOUNTER — Other Ambulatory Visit: Payer: Self-pay

## 2020-07-09 ENCOUNTER — Ambulatory Visit (INDEPENDENT_AMBULATORY_CARE_PROVIDER_SITE_OTHER): Payer: Medicare Other | Admitting: Primary Care

## 2020-07-09 VITALS — BP 118/82 | HR 96 | Temp 97.2°F | Ht 63.0 in | Wt 237.4 lb

## 2020-07-09 DIAGNOSIS — R2689 Other abnormalities of gait and mobility: Secondary | ICD-10-CM

## 2020-07-09 DIAGNOSIS — K219 Gastro-esophageal reflux disease without esophagitis: Secondary | ICD-10-CM

## 2020-07-09 DIAGNOSIS — L309 Dermatitis, unspecified: Secondary | ICD-10-CM | POA: Insufficient documentation

## 2020-07-09 DIAGNOSIS — E785 Hyperlipidemia, unspecified: Secondary | ICD-10-CM | POA: Diagnosis not present

## 2020-07-09 DIAGNOSIS — L719 Rosacea, unspecified: Secondary | ICD-10-CM

## 2020-07-09 DIAGNOSIS — R110 Nausea: Secondary | ICD-10-CM

## 2020-07-09 DIAGNOSIS — Z1231 Encounter for screening mammogram for malignant neoplasm of breast: Secondary | ICD-10-CM | POA: Diagnosis not present

## 2020-07-09 DIAGNOSIS — I10 Essential (primary) hypertension: Secondary | ICD-10-CM | POA: Diagnosis not present

## 2020-07-09 DIAGNOSIS — F3178 Bipolar disorder, in full remission, most recent episode mixed: Secondary | ICD-10-CM

## 2020-07-09 DIAGNOSIS — R739 Hyperglycemia, unspecified: Secondary | ICD-10-CM

## 2020-07-09 LAB — COMPREHENSIVE METABOLIC PANEL
ALT: 20 U/L (ref 0–35)
AST: 18 U/L (ref 0–37)
Albumin: 4 g/dL (ref 3.5–5.2)
Alkaline Phosphatase: 121 U/L — ABNORMAL HIGH (ref 39–117)
BUN: 25 mg/dL — ABNORMAL HIGH (ref 6–23)
CO2: 29 mEq/L (ref 19–32)
Calcium: 9.4 mg/dL (ref 8.4–10.5)
Chloride: 103 mEq/L (ref 96–112)
Creatinine, Ser: 0.98 mg/dL (ref 0.40–1.20)
GFR: 63.17 mL/min (ref >60.00)
Glucose, Bld: 101 mg/dL — ABNORMAL HIGH (ref 70–99)
Potassium: 4.6 mEq/L (ref 3.5–5.1)
Sodium: 139 mEq/L (ref 135–145)
Total Bilirubin: 0.4 mg/dL (ref 0.2–1.2)
Total Protein: 6.6 g/dL (ref 6.0–8.3)

## 2020-07-09 LAB — LIPID PANEL
Cholesterol: 188 mg/dL (ref 0–200)
HDL: 55.7 mg/dL (ref >39.00)
LDL Cholesterol: 98 mg/dL (ref 0–99)
NonHDL: 132.54
Total CHOL/HDL Ratio: 3
Triglycerides: 174 mg/dL — ABNORMAL HIGH (ref 0.0–149.0)
VLDL: 34.8 mg/dL (ref 0.0–40.0)

## 2020-07-09 LAB — CBC
HCT: 42.5 % (ref 36.0–46.0)
Hemoglobin: 14 g/dL (ref 12.0–15.0)
MCHC: 32.9 g/dL (ref 30.0–36.0)
MCV: 86.6 fl (ref 78.0–100.0)
Platelets: 251 10*3/uL (ref 150.0–400.0)
RBC: 4.91 Mil/uL (ref 3.87–5.11)
RDW: 13.6 % (ref 11.5–15.5)
WBC: 6.9 10*3/uL (ref 4.0–10.5)

## 2020-07-09 LAB — TSH: TSH: 2 u[IU]/mL (ref 0.35–4.50)

## 2020-07-09 LAB — HEMOGLOBIN A1C: Hgb A1c MFr Bld: 6 % (ref 4.6–6.5)

## 2020-07-09 MED ORDER — OMEPRAZOLE 20 MG PO CPDR: 20.0000 mg | DELAYED_RELEASE_CAPSULE | Freq: Two times a day (BID) | ORAL | 0 refills | Status: DC

## 2020-07-09 NOTE — Progress Notes (Signed)
Subjective:    Patient ID: Katherine Carpenter, female    DOB: 12-05-1960, 60 y.o.   MRN: [id number]  HPI  This visit occurred during the SARS-CoV-2 public health emergency.  Safety protocols were in place, including screening questions prior to the visit, additional usage of staff PPE, and extensive cleaning of exam room while observing appropriate contact time as indicated for disinfecting solutions.   Katherine Carpenter is a 60 year old female who presents today for [place] Part 2 and follow up of chronic conditions.  Immunizations: -Tetanus: 2014 -Influenza: Completed this season  -Shingles: Completed Shingrix -Covid-19: Completed three vaccines  Pap Smear: 2020 Mammogram: March 2021, due Colonoscopy: Completed in 2013, due in 2023 Hep C Screen: Negative  BP Readings from Last 3 Encounters:  07/09/20 118/82  05/26/20 136/82  02/02/20 (!) 148/82     Review of Systems  Eyes: Negative for visual disturbance.  Respiratory: Negative for shortness of breath.   Cardiovascular: Negative for chest pain.  Gastrointestinal: Negative for constipation and diarrhea.  Neurological: Negative for dizziness and headaches.  Psychiatric/Behavioral: The patient is not nervous/anxious.        Past Medical History:  Diagnosis Date  . Depression   . Hypertension      Social History   Socioeconomic History  . Marital status: Married    Spouse name: Not on file  . Number of children: Not on file  . Years of education: Not on file  . Highest education level: Not on file  Occupational History  . Not on file  Tobacco Use  . Smoking status: Never Smoker  . Smokeless tobacco: Never Used  Substance and Sexual Activity  . Alcohol use: No    Comment: Pt denies  . Drug use: No    Comment: Pt denies  . Sexual activity: Not on file  Other Topics Concern  . Not on file  Social History Narrative  . Not on file   Social Determinants of Health   Financial Resource Strain: Low Risk   .  Difficulty of Paying Living Expenses: Not hard at all  Food Insecurity: No Food Insecurity  . Worried About Charity fundraiser in the Last Year: Never true  . Ran Out of Food in the Last Year: Never true  Transportation Needs: No Transportation Needs  . Lack of Transportation (Medical): No  . Lack of Transportation (Non-Medical): No  Physical Activity: Inactive  . Days of Exercise per Week: 0 days  . Minutes of Exercise per Session: 0 min  Stress: No Stress Concern Present  . Feeling of Stress : Not at all  Social Connections: Not on file  Intimate Partner Violence: Not At Risk  . Fear of Current or Ex-Partner: No  . Emotionally Abused: No  . Physically Abused: No  . Sexually Abused: No    Past Surgical History:  Procedure Laterality Date  . CERVICAL BIOPSY  W/ LOOP ELECTRODE EXCISION  2016  . ESOPHAGOGASTRODUODENOSCOPY (EGD) WITH PROPOFOL N/A 10/27/2019   Procedure: ESOPHAGOGASTRODUODENOSCOPY (EGD) WITH PROPOFOL;  Surgeon: Virgel Manifold, MD;  Location: [facility] ENDOSCOPY;  Service: Endoscopy;  Laterality: N/A;  . TUBAL LIGATION      Family History  Problem Relation Age of Onset  . Asthma Mother   . Depression Daughter   . Arthritis Maternal Grandmother   . Cancer Maternal Grandmother        lung  . Cancer Paternal Grandmother   . Leukemia Paternal Grandmother     No Known  Allergies  Current Outpatient Medications on File Prior to Visit  Medication Sig Dispense Refill  . ALPRAZolam (XANAX) 1 MG tablet Take 1 mg by mouth 5 (five) times daily. Patient may take up to 5 times a day    . amphetamine-dextroamphetamine (ADDERALL XR) 20 MG 24 hr capsule Take 20 mg by mouth daily.    Marland Kitchen augmented betamethasone dipropionate (DIPROLENE-AF) 0.05 % cream Apply topically 2 (two) times daily.    Marland Kitchen buPROPion (WELLBUTRIN XL) 150 MG 24 hr tablet Take 150 mg by mouth daily.    . Calcium Carbonate-Vitamin D 500-125 MG-UNIT TABS Take by mouth.    . carbamazepine (TEGRETOL) 200 MG tablet  TAKE ONE TABLET BY MOUTH EVERY MORNING AND THREE TABLETS AT BEDTIME    . Dupilumab (DUPIXENT) 300 MG/2ML SOPN     . fluticasone (FLONASE) 50 MCG/ACT nasal spray Place 1 spray into both nostrils 2 (two) times daily. 16 g 0  . lamoTRIgine (LAMICTAL) 200 MG tablet Take 100-200 mg by mouth 2 (two) times daily. 1/2 tablet every morning and 1 tablet every evening    . metoprolol succinate (TOPROL-XL) 25 MG 24 hr tablet TAKE 1/2 TABLET BY MOUTH EVERY DAY 45 tablet 3  . metroNIDAZOLE (METROCREAM) 0.75 % cream Apply topically.    . Multiple Vitamin (MULTI-VITAMINS) TABS Take by mouth.    . olanzapine-FLUoxetine (SYMBYAX) 12-25 MG per capsule Take 1 capsule by mouth every evening.    . ondansetron (ZOFRAN-ODT) 8 MG disintegrating tablet Take 1 tablet (8 mg total) by mouth every 8 (eight) hours as needed for nausea. 20 tablet 0  . QUEtiapine (SEROQUEL XR) 200 MG 24 hr tablet Take 1,000 mg by mouth at bedtime. Patient takes 5 tablets by mouth every night at bedtime    . simvastatin (ZOCOR) 10 MG tablet TAKE 1 TABLET BY MOUTH DAILY 90 tablet 2   No current facility-administered medications on file prior to visit.    BP 118/82   Pulse 96   Temp (!) 97.2 F (36.2 C) (Temporal)   Ht 5\' 3"  (1.6 m)   Wt 237 lb 6.4 oz (107.7 kg)   LMP 09/08/2011   SpO2 99%   BMI 42.05 kg/m    Objective:   Physical Exam Constitutional:      Appearance: She is well-nourished.  Cardiovascular:     Rate and Rhythm: Normal rate and regular rhythm.  Pulmonary:     Effort: Pulmonary effort is normal.     Breath sounds: Normal breath sounds.  Abdominal:     General: Abdomen is flat. Bowel sounds are normal.     Palpations: Abdomen is soft.     Tenderness: There is no abdominal tenderness.  Musculoskeletal:     Cervical back: Neck supple.  Skin:    General: Skin is warm and dry.  Neurological:     Mental Status: She is alert and oriented to person, place, and time.  Psychiatric:        Mood and Affect: Mood and  affect and mood normal.            Assessment & Plan:

## 2020-07-09 NOTE — Assessment & Plan Note (Signed)
Repeat lipid panel pending. Continue simvastatin 10 mg.

## 2020-07-09 NOTE — Assessment & Plan Note (Signed)
Following with dermatology, now on [place]. Continue same.

## 2020-07-09 NOTE — Assessment & Plan Note (Signed)
Occurring each morning upon waking. Given night time reflux symptoms, suspect GERD may be contributing.  Increase omeprazole to 20 mg BID. She will update.

## 2020-07-09 NOTE — Patient Instructions (Signed)
Stop by the lab prior to leaving today. I will notify you of your results once received.   Start exercising. You should be getting 150 minutes of moderate intensity exercise weekly.  It's important to improve your diet by reducing consumption of fast food, fried food, processed snack foods, sugary drinks. Increase consumption of fresh vegetables and fruits, whole grains, water.  Ensure you are drinking 64 ounces of water daily.  [facility] your [facility].   We increased your omeprazole to 20 mg twice daily, please update me as discussed.  It was a pleasure to see you today!

## 2020-07-09 NOTE — Assessment & Plan Note (Signed)
Doing very well, no recent use of metronidazole cream. Follows with dermatology.

## 2020-07-09 NOTE — Assessment & Plan Note (Signed)
Improved, continue to monitor

## 2020-07-09 NOTE — Assessment & Plan Note (Signed)
Doing very well on current regimen, follows with psychiatry.

## 2020-07-09 NOTE — Assessment & Plan Note (Addendum)
Continued evening GERD symptoms despite omeprazole 20 mg in AM.   Will increase omeprazole to 20 mg BID. She will update.

## 2020-07-09 NOTE — Assessment & Plan Note (Signed)
Well controlled on metoprolol succinate 25 mg. Continue current regimen.

## 2020-07-11 ENCOUNTER — Other Ambulatory Visit: Payer: Self-pay | Admitting: Primary Care

## 2020-07-11 DIAGNOSIS — N200 Calculus of kidney: Secondary | ICD-10-CM

## 2020-07-15 ENCOUNTER — Other Ambulatory Visit: Payer: Self-pay | Admitting: Primary Care

## 2020-07-15 DIAGNOSIS — N200 Calculus of kidney: Secondary | ICD-10-CM

## 2020-08-28 ENCOUNTER — Other Ambulatory Visit: Payer: Self-pay | Admitting: Primary Care

## 2020-08-28 DIAGNOSIS — E785 Hyperlipidemia, unspecified: Secondary | ICD-10-CM

## 2020-08-28 DIAGNOSIS — I10 Essential (primary) hypertension: Secondary | ICD-10-CM

## 2020-09-06 ENCOUNTER — Ambulatory Visit
Admission: RE | Admit: 2020-09-06 | Discharge: 2020-09-06 | Disposition: A | Discharge status: Home / Self Care | Payer: Medicare Other | Source: Ambulatory Visit | Attending: Primary Care | Admitting: Primary Care

## 2020-09-06 ENCOUNTER — Other Ambulatory Visit: Payer: Self-pay

## 2020-09-06 DIAGNOSIS — Z1231 Encounter for screening mammogram for malignant neoplasm of breast: Secondary | ICD-10-CM | POA: Diagnosis not present

## 2020-09-16 ENCOUNTER — Ambulatory Visit (INDEPENDENT_AMBULATORY_CARE_PROVIDER_SITE_OTHER): Payer: Medicare Other | Admitting: Family Medicine

## 2020-09-16 ENCOUNTER — Encounter: Payer: Self-pay | Admitting: Family Medicine

## 2020-09-16 ENCOUNTER — Other Ambulatory Visit: Payer: Self-pay

## 2020-09-16 VITALS — BP 130/80 | HR 96 | Temp 98.0°F | Ht 63.0 in | Wt 239.5 lb

## 2020-09-16 DIAGNOSIS — R198 Other specified symptoms and signs involving the digestive system and abdomen: Secondary | ICD-10-CM | POA: Diagnosis not present

## 2020-09-16 NOTE — Progress Notes (Signed)
Bevely Hackbart T. Milik Gilreath, MD, [place]  [facility] [place], [address]  Phone: [phone]  FAX: [phone]  Katherine Carpenter - 60 y.o. female  MRN [id number]  Date of Birth: 08/17/1960  Date: 09/16/2020  PCP: Pleas Koch, NP  Referral: Pleas Koch, NP  Chief Complaint  Patient presents with  . Cough    Severe Gagging only in the morning-Normal Endo couple a months ago    This visit occurred during the SARS-CoV-2 public health emergency.  Safety protocols were in place, including screening questions prior to the visit, additional usage of staff PPE, and extensive cleaning of exam room while observing appropriate contact time as indicated for disinfecting solutions.   Subjective:   Katherine Carpenter is a 60 y.o. very pleasant female patient with Body mass index is 42.43 kg/m. who presents with the following:  Coughs and gags in the AM: This happens for a number of minutes, and she does not have any pain.  It is quite prolific.  EGD was grossly normal 1 year ago by Dr. Bonna Gains.  10/2019.  She was admitted with abdominal pain.  In the mornings when she wakes up, almost daily she will start to gag in the AM.  Does not seem to bother her in the the afternoon, it seems to be only in the a.m. first thing in the morning.  Ongoing only for a couple of months.  Feels like her esopagus - feels neck.   Never had some issues -like this in the past. She is already on Prilosec twice daily. She does take Flonase as well as Claritin versus Allegra daily. She does not think she has routine allergies.  Will feel some fullness in the ears.   Some fluid in the ears.  ETD dysfunction.  Now she is taking Claritin vs Allegra. Dry eyes some only. Happens for a few minues and very severe.  Non-smoker  She is not on any new medication, and she has been on everything that she  takes now for a very long time.  Review of Systems is noted in the HPI, as appropriate  Objective:   BP 130/80   Pulse 96   Temp 98 F (36.7 C) (Temporal)   Ht 5\' 3"  (1.6 m)   Wt 239 lb 8 oz (108.6 kg)   LMP 09/08/2011   SpO2 98%   BMI 42.43 kg/m   GEN: No acute distress; alert,appropriate. PULM: Breathing comfortably in no respiratory distress, CTAB PSYCH: Normally interactive.  CV: RRR, no m/g/r  ENT: She does have some boggy turbinates, but the rest of the ENT exam is totally normal.  No lymphadenopathy.  TMs do have some small amount of serous fluid.  Laboratory and Imaging Data:  Assessment and Plan:     ICD-10-CM   1. Episode of gagging  R19.8 Ambulatory referral to ENT   I really do not know or have a good answer as to this etiology.  Most likely by history postnasal drip?Marland Kitchen  She is already taking antihistamines as well as some Flonase.  She has had a normal endoscopy, she is also taking Prilosec twice daily.  I do not see how this could be from one of her medications.  I am going to have her see ENT, hopefully they will have some suggestions.  Orders Placed This Encounter  Procedures  . Ambulatory referral to ENT    Follow-up:  No follow-ups on file.  Signed,  Maud Deed. Krisalyn Yankowski, MD   Outpatient Encounter Medications as of 09/16/2020  Medication Sig  . ALPRAZolam (XANAX) 1 MG tablet Take 1 mg by mouth 5 (five) times daily. Patient may take up to 5 times a day  . amphetamine-dextroamphetamine (ADDERALL XR) 20 MG 24 hr capsule Take 20 mg by mouth daily.  Marland Kitchen augmented betamethasone dipropionate (DIPROLENE-AF) 0.05 % cream Apply topically 2 (two) times daily.  Marland Kitchen buPROPion (WELLBUTRIN XL) 150 MG 24 hr tablet Take 150 mg by mouth daily.  . Calcium Carbonate-Vitamin D 500-125 MG-UNIT TABS Take by mouth.  . carbamazepine (TEGRETOL) 200 MG tablet TAKE ONE TABLET BY MOUTH EVERY MORNING AND THREE TABLETS AT BEDTIME  . Dupilumab (DUPIXENT) 300 MG/2ML SOPN   .  fluticasone (FLONASE) 50 MCG/ACT nasal spray Place 1 spray into both nostrils 2 (two) times daily.  Marland Kitchen lamoTRIgine (LAMICTAL) 200 MG tablet Take 100-200 mg by mouth 2 (two) times daily. 1/2 tablet every morning and 1 tablet every evening  . metoprolol succinate (TOPROL-XL) 25 MG 24 hr tablet Take 0.5 tablets (12.5 mg total) by mouth daily. For heart rate and blood pressure.  . metroNIDAZOLE (METROCREAM) 0.75 % cream Apply topically.  . Multiple Vitamin (MULTI-VITAMINS) TABS Take by mouth.  . olanzapine-FLUoxetine (SYMBYAX) 12-25 MG per capsule Take 1 capsule by mouth every evening.  Marland Kitchen omeprazole (PRILOSEC) 20 MG capsule Take 1 capsule (20 mg total) by mouth 2 (two) times daily before a meal. For heartburn.  . ondansetron (ZOFRAN-ODT) 8 MG disintegrating tablet Take 1 tablet (8 mg total) by mouth every 8 (eight) hours as needed for nausea.  Marland Kitchen QUEtiapine (SEROQUEL XR) 200 MG 24 hr tablet Take 1,000 mg by mouth at bedtime. Patient takes 5 tablets by mouth every night at bedtime  . simvastatin (ZOCOR) 10 MG tablet Take 1 tablet (10 mg total) by mouth daily. For cholesterol.   No facility-administered encounter medications on file as of 09/16/2020.

## 2020-09-26 ENCOUNTER — Other Ambulatory Visit: Payer: Self-pay | Admitting: Primary Care

## 2020-09-26 DIAGNOSIS — K219 Gastro-esophageal reflux disease without esophagitis: Secondary | ICD-10-CM

## 2020-09-27 NOTE — Telephone Encounter (Signed)
Has increasing omeprazole to 20 mg BID helped with nausea and heartburn? If so then okay to send Rx refills as pended.

## 2020-09-28 NOTE — Telephone Encounter (Signed)
Called patient has had improvement with increase will call in as pended. She will call if any questions.

## 2020-10-13 DIAGNOSIS — F458 Other somatoform disorders: Secondary | ICD-10-CM | POA: Diagnosis not present

## 2020-10-13 DIAGNOSIS — G4733 Obstructive sleep apnea (adult) (pediatric): Secondary | ICD-10-CM | POA: Diagnosis not present

## 2020-10-18 DIAGNOSIS — F4322 Adjustment disorder with anxiety: Secondary | ICD-10-CM | POA: Diagnosis not present

## 2020-10-22 DIAGNOSIS — G4733 Obstructive sleep apnea (adult) (pediatric): Secondary | ICD-10-CM | POA: Diagnosis not present

## 2020-10-25 DIAGNOSIS — F4322 Adjustment disorder with anxiety: Secondary | ICD-10-CM | POA: Diagnosis not present

## 2020-11-04 ENCOUNTER — Ambulatory Visit (INDEPENDENT_AMBULATORY_CARE_PROVIDER_SITE_OTHER): Payer: Medicare Other | Admitting: Dermatology

## 2020-11-04 ENCOUNTER — Other Ambulatory Visit: Payer: Self-pay

## 2020-11-04 DIAGNOSIS — L719 Rosacea, unspecified: Secondary | ICD-10-CM

## 2020-11-04 DIAGNOSIS — L309 Dermatitis, unspecified: Secondary | ICD-10-CM

## 2020-11-04 MED ORDER — DOXYCYCLINE HYCLATE 20 MG PO TABS: 20.0000 mg | ORAL_TABLET | Freq: Two times a day (BID) | ORAL | 3 refills | Status: DC

## 2020-11-04 NOTE — Progress Notes (Signed)
New Patient Visit  Subjective  Katherine Carpenter is a 60 y.o. female who presents for the following: New Patient (Initial Visit) (Patient here today as a new patient and here to establish care. She states she has been dealing with eczema for years. She currently takes dupixent and states eczema is under control. She gets eczema primarily on her ankles. She also reports history of rosacea and has been using metronidazole cream and she is currently taking doxycycline twice daily to help treat. Patient denies family or personal history of skin cancer. ).    Objective  Well appearing patient in no apparent distress; mood and affect are within normal limits.  A focused examination was performed including face, bilateral legs. Relevant physical exam findings are noted in the Assessment and Plan.  Objective  Head - Anterior (Face): Mid face erythema with few inflammatory papules and telangectasia   Objective  Right Ankle - Anterior: clear  Assessment & Plan  Rosacea Head - Anterior (Face)  Chronic condition with duration or expected duration over one year. Condition is bothersome to patient. Not currently at goal.  Rosacea is a chronic progressive skin condition usually affecting the face of adults, causing redness and/or acne bumps. It is treatable but not curable. It sometimes affects the eyes (ocular rosacea) as well. It may respond to topical and/or systemic medication and can flare with stress, sun exposure, alcohol, exercise and some foods.  Daily application of broad spectrum spf 30+ sunscreen to face is recommended to reduce flares.  Patient denies eye grittiness   Discussed other rosacea cream options and recommendation to lower dose of doxycycline to prevent antibiotic resistance  Try soolantra samples if this does not work well consider switching to zilxi or skin medicinals metronidazole/ivermectin/azelaic acid  D/c Doxycycline 100 mg by mouth twice daily.   Start doxycycline  20 mg by mouth twice daily with food.   Doxycycline should be taken with food to prevent nausea. Do not lay down for 30 minutes after taking. Be cautious with sun exposure and use good sun protection while on this medication. Pregnant women should not take this medication.   Discussed BBL as a treatment option and recommend having a done in fall  3 - 5 treatments   Given oracea sample 09/22  [id number]     (1) sample   Given soolantra samples  08/23 [id number]   (2) samples apply thin layer once daily to affected areas of face  Patient will call next week and let us know how samples worked   doxycycline (PERIOSTAT) 20 MG tablet - Head - Anterior (Face)  Eczema, unspecified type Right Ankle - Anterior  Chronic condition with duration or expected duration over one year. Currently well-controlled.  Atopic dermatitis - Severe, on Dupixent (biologic medication).  Atopic dermatitis (eczema) is a chronic, relapsing, pruritic condition that can significantly affect quality of life. It is often associated with allergic rhinitis and/or asthma and can require treatment with topical medications, phototherapy, or in severe cases a biologic medication called Dupixent.   Patient has been using dupixent for last 3 - 4 months with no side effects   Continue Dupixent at this time.   Denies any side effects   Return in about 3 months (around 02/04/2021) for rosacea and eczema follow up.  I, Ruthell Rummage, CMA, am acting as scribe for Forest Gleason, MD.   Documentation: I have reviewed the above documentation for accuracy and completeness, and I agree with the above.  Katherine Vidyuth Belsito,  MD   

## 2020-11-04 NOTE — Patient Instructions (Addendum)
Rosacea  What is rosacea? Rosacea (say: ro-zay-sha) is a common skin disease that usually begins as a trend of flushing or blushing easily.  As rosacea progresses, a persistent redness in the center of the face will develop and may gradually spread beyond the nose and cheeks to the forehead and chin.  In some cases, the ears, chest, and back could be affected.  Rosacea may appear as tiny blood vessels or small red bumps that occur in crops.  Frequently they can contain pus, and are called "pustules".  If the bumps do not contain pus, they are referred to as "papules".  Rarely, in prolonged, untreated cases of rosacea, the oil glands of the nose and cheeks may become permanently enlarged.  This is called rhinophyma, and is seen more frequently in men.  Signs and Risks In its beginning stages, rosacea tends to come and go, which makes it difficult to recognize.  It can start as intermittent flushing of the face.  Eventually, blood vessels may become permanently visible.  Pustules and papules can appear, but can be mistaken for adult acne.  People of all races, ages, genders and ethnic groups are at risk of developing rosacea.  However, it is more common in women (especially around menopause) and adults with fair skin between the ages of 30 and 50.  Treatment Dermatologists typically recommend a combination of treatments to effectively manage rosacea.  Treatment can improve symptoms and may stop the progression of the rosacea.  Treatment may involve both topical and oral medications.  The tetracycline antibiotics are often used for their anti-inflammatory effect; however, because of the possibility of developing antibiotic resistance, they should not be used long term at full dose.  For dilated blood vessels the options include electrodessication (uses electric current through a small needle), laser treatment, and cosmetics to hide the redness.   With all forms of treatment, improvement is a slow process, and  patients may not see any results for the first 3-4 weeks.  It is very important to avoid the sun and other triggers.  Patients must wear sunscreen daily.  Skin Care Instructions: 1. Cleanse the skin with a mild soap such as CeraVe cleanser, Cetaphil cleanser, or Dove soap once or twice daily as needed. 2. Moisturize with Eucerin Redness Relief Daily Perfecting Lotion (has a subtle green tint), CeraVe Moisturizing Cream, or Oil of Olay Daily Moisturizer with sunscreen every morning and/or night as recommended. 3. Makeup should be "non-comedogenic" (won't clog pores) and be labeled "for sensitive skin". Good choices for cosmetics are: Neutrogena, Almay, and Physician's Formula.  Any product with a green tint tends to offset a red complexion. 4. If your eyes are dry and irritated, use artificial tears 2-3 times per day and cleanse the eyelids daily with baby shampoo.  Have your eyes examined at least every 2 years.  Be sure to tell your eye doctor that you have rosacea. 5. Alcoholic beverages tend to cause flushing of the skin, and may make rosacea worse. 6. Always wear sunscreen, protect your skin from extreme hot and cold temperatures, and avoid spicy foods, hot drinks, and mechanical irritation such as rubbing, scrubbing, or massaging the face.  Avoid harsh skin cleansers, cleansing masks, astringents, and exfoliation. If a particular product burns or makes your face feel tight, then it is likely to flare your rosacea. 7. If you are having difficulty finding a sunscreen that you can tolerate, you may try switching to a chemical-free sunscreen.  These are ones whose active   ingredient is zinc oxide or titanium dioxide only.  They should also be fragrance free, non-comedogenic, and labeled for sensitive skin. 8. Rosacea triggers may vary from person to person.  There are a variety of foods that have been reported to trigger rosacea.  Some patients find that keeping a diary of what they were doing when they  flared helps them avoid triggers.   Doxycycline should be taken with food to prevent nausea. Do not lay down for 30 minutes after taking. Be cautious with sun exposure and use good sun protection while on this medication. Pregnant women should not take this medication.      Recommend taking Heliocare sun protection supplement daily in sunny weather for additional sun protection. For maximum protection on the sunniest days, you can take up to 2 capsules of regular Heliocare OR take 1 capsule of Heliocare Ultra. For prolonged exposure (such as a full day in the sun), you can repeat your dose of the supplement 4 hours after your first dose. Heliocare can be purchased at [facility] or at [email or web address].   Recommend daily broad spectrum sunscreen SPF 30+ to sun-exposed areas, reapply every 2 hours as needed. Call for new or changing lesions.  Staying in the shade or wearing long sleeves, sun glasses (UVA+UVB protection) and wide brim hats (4-inch brim around the entire circumference of the hat) are also recommended for sun protection.     If you have any questions or concerns for your doctor, please call our main line at [phone] and press option 4 to reach your doctor's medical assistant. If no one answers, please leave a voicemail as directed and we will return your call as soon as possible. Messages left after 4 pm will be answered the following business day.   You may also send [place] a message via [place]. We typically respond to MyChart messages within 1-2 business days.  For prescription refills, please ask your pharmacy to contact our office. Our fax number is [phone].  If you have an urgent issue when the clinic is closed that cannot wait until the next business day, you can page your doctor at the number below.    Please note that while we do our best to be available for urgent issues outside of office hours, we are not available 24/7.   If you have an urgent issue and  are unable to reach [place], you may choose to seek medical care at your doctor's office, retail clinic, urgent care center, or emergency room.  If you have a medical emergency, please immediately call 911 or go to the emergency department.  Pager Numbers  - Dr. Nehemiah Massed: [phone]  - Dr. Laurence Ferrari: [phone]  - Dr. Nicole Kindred: [phone]  In the event of inclement weather, please call our main line at [phone] for an update on the status of any delays or closures.  Dermatology Medication Tips: Please keep the boxes that topical medications come in in order to help keep track of the instructions about where and how to use these. Pharmacies typically print the medication instructions only on the boxes and not directly on the medication tubes.   If your medication is too expensive, please contact our office at [phone] option 4 or send [place] a message through [place].   We are unable to tell what your co-pay for medications will be in advance as this is different depending on your insurance coverage. However, we may be able to find a substitute medication at lower cost or fill  out paperwork to get insurance to cover a needed medication.   If a prior authorization is required to get your medication covered by your insurance company, please allow [place] 1-2 business days to complete this process.  Drug prices often vary depending on where the prescription is filled and some pharmacies may offer cheaper prices.  The website www.goodrx.com contains coupons for medications through different pharmacies. The prices here do not account for what the cost may be with help from insurance (it may be cheaper with your insurance), but the website can give you the price if you did not use any insurance.  - You can print the associated coupon and take it with your prescription to the pharmacy.  - You may also stop by our office during regular business hours and pick up a GoodRx coupon card.  - If you need your  prescription sent electronically to a different pharmacy, notify our office through [facility] or by phone at [phone] option 4.

## 2020-11-08 ENCOUNTER — Encounter: Payer: Self-pay | Admitting: Dermatology

## 2020-11-09 ENCOUNTER — Other Ambulatory Visit: Payer: Self-pay | Admitting: Primary Care

## 2020-11-09 DIAGNOSIS — N200 Calculus of kidney: Secondary | ICD-10-CM

## 2020-11-09 NOTE — Telephone Encounter (Signed)
Received refill request for tamsulosin (Flomax), was prescribed in January for renal stone. Is she experiencing symptoms? What's going on?

## 2020-11-10 NOTE — Telephone Encounter (Signed)
Called patient did not need refill. No symptoms has been off for several months.

## 2020-11-12 DIAGNOSIS — F4323 Adjustment disorder with mixed anxiety and depressed mood: Secondary | ICD-10-CM | POA: Diagnosis not present

## 2020-11-15 ENCOUNTER — Telehealth: Payer: Self-pay

## 2020-11-15 ENCOUNTER — Other Ambulatory Visit: Payer: Self-pay

## 2020-11-15 MED ORDER — DUPIXENT 300 MG/2ML ~~LOC~~ SOAJ: SUBCUTANEOUS | 2 refills | Status: DC

## 2020-11-15 NOTE — Telephone Encounter (Signed)
Pt called requesting a prescription for Dupixent pens, okay Dupixent pens sent to [facility]

## 2020-11-16 DIAGNOSIS — Z6841 Body Mass Index (BMI) 40.0 and over, adult: Secondary | ICD-10-CM

## 2020-11-16 NOTE — Telephone Encounter (Signed)
Joellen, my apologies, but what is the referral for healthy weight and wellness center in [place]?

## 2020-11-17 ENCOUNTER — Other Ambulatory Visit: Payer: Self-pay

## 2020-11-17 DIAGNOSIS — L309 Dermatitis, unspecified: Secondary | ICD-10-CM

## 2020-11-17 MED ORDER — DUPIXENT 300 MG/2ML ~~LOC~~ SOAJ: 300.0000 mg | SUBCUTANEOUS | 5 refills | Status: DC

## 2020-11-17 NOTE — Telephone Encounter (Signed)
Referral has been started for you.

## 2020-11-26 DIAGNOSIS — F4323 Adjustment disorder with mixed anxiety and depressed mood: Secondary | ICD-10-CM | POA: Diagnosis not present

## 2020-12-01 ENCOUNTER — Telehealth: Payer: Self-pay

## 2020-12-01 DIAGNOSIS — L719 Rosacea, unspecified: Secondary | ICD-10-CM

## 2020-12-01 MED ORDER — DOXYCYCLINE HYCLATE 20 MG PO TABS: 20.0000 mg | ORAL_TABLET | Freq: Two times a day (BID) | ORAL | 0 refills | Status: DC

## 2020-12-01 MED ORDER — SOOLANTRA 1 % EX CREA: 1.0000 "application " | TOPICAL_CREAM | Freq: Every day | CUTANEOUS | 0 refills | Status: DC

## 2020-12-01 NOTE — Telephone Encounter (Signed)
Patient called need the RX for Doxycycline and Soolantra.

## 2020-12-22 NOTE — Telephone Encounter (Signed)
Noted, glad she is feeling better. Happy to evaluate if anything changes.

## 2020-12-23 ENCOUNTER — Other Ambulatory Visit: Payer: Self-pay

## 2020-12-23 ENCOUNTER — Telehealth: Payer: Self-pay

## 2020-12-23 ENCOUNTER — Encounter: Payer: Self-pay | Admitting: Emergency Medicine

## 2020-12-23 ENCOUNTER — Emergency Department
Admission: EM | Admit: 2020-12-23 | Discharge: 2020-12-23 | Disposition: A | Discharge status: Home / Self Care | Payer: Medicare Other | Attending: Emergency Medicine | Admitting: Emergency Medicine

## 2020-12-23 DIAGNOSIS — R9431 Abnormal electrocardiogram [ECG] [EKG]: Secondary | ICD-10-CM | POA: Diagnosis not present

## 2020-12-23 DIAGNOSIS — Z79899 Other long term (current) drug therapy: Secondary | ICD-10-CM | POA: Insufficient documentation

## 2020-12-23 DIAGNOSIS — I10 Essential (primary) hypertension: Secondary | ICD-10-CM | POA: Diagnosis not present

## 2020-12-23 DIAGNOSIS — R531 Weakness: Secondary | ICD-10-CM

## 2020-12-23 DIAGNOSIS — R5383 Other fatigue: Secondary | ICD-10-CM

## 2020-12-23 LAB — COMPREHENSIVE METABOLIC PANEL
ALT: 21 U/L (ref 0–44)
AST: 19 U/L (ref 15–41)
Albumin: 4 g/dL (ref 3.5–5.0)
Alkaline Phosphatase: 112 U/L (ref 38–126)
Anion gap: 9 (ref 5–15)
BUN: 19 mg/dL (ref 6–20)
CO2: 25 mmol/L (ref 22–32)
Calcium: 9.4 mg/dL (ref 8.9–10.3)
Chloride: 105 mmol/L (ref 98–111)
Creatinine, Ser: 0.8 mg/dL (ref 0.44–1.00)
GFR, Estimated: >60 mL/min (ref >60)
Glucose, Bld: 125 mg/dL — ABNORMAL HIGH (ref 70–99)
Potassium: 4 mmol/L (ref 3.5–5.1)
Sodium: 139 mmol/L (ref 135–145)
Total Bilirubin: 0.5 mg/dL (ref 0.3–1.2)
Total Protein: 7.3 g/dL (ref 6.5–8.1)

## 2020-12-23 LAB — URINALYSIS, COMPLETE (UACMP) WITH MICROSCOPIC
Bilirubin Urine: NEGATIVE
Glucose, UA: NEGATIVE mg/dL
Hgb urine dipstick: NEGATIVE
Ketones, ur: NEGATIVE mg/dL
Nitrite: NEGATIVE
Protein, ur: NEGATIVE mg/dL
Specific Gravity, Urine: 1.024 (ref 1.005–1.030)
pH: 5 (ref 5.0–8.0)

## 2020-12-23 LAB — TYPE AND SCREEN
ABO/RH(D): A POS
Antibody Screen: NEGATIVE

## 2020-12-23 LAB — CBC WITH DIFFERENTIAL/PLATELET
Abs Immature Granulocytes: 0.04 10*3/uL (ref 0.00–0.07)
Basophils Absolute: 0 10*3/uL (ref 0.0–0.1)
Basophils Relative: 0 %
Eosinophils Absolute: 0.1 10*3/uL (ref 0.0–0.5)
Eosinophils Relative: 2 %
HCT: 42.1 % (ref 36.0–46.0)
Hemoglobin: 14.1 g/dL (ref 12.0–15.0)
Immature Granulocytes: 1 %
Lymphocytes Relative: 28 %
Lymphs Abs: 1.9 10*3/uL (ref 0.7–4.0)
MCH: 28.5 pg (ref 26.0–34.0)
MCHC: 33.5 g/dL (ref 30.0–36.0)
MCV: 85.2 fL (ref 80.0–100.0)
Monocytes Absolute: 0.5 10*3/uL (ref 0.1–1.0)
Monocytes Relative: 7 %
Neutro Abs: 4.3 10*3/uL (ref 1.7–7.7)
Neutrophils Relative %: 62 %
Platelets: 251 10*3/uL (ref 150–400)
RBC: 4.94 MIL/uL (ref 3.87–5.11)
RDW: 13.2 % (ref 11.5–15.5)
WBC: 6.8 10*3/uL (ref 4.0–10.5)
nRBC: 0 % (ref 0.0–0.2)

## 2020-12-23 LAB — TROPONIN I (HIGH SENSITIVITY)
Troponin I (High Sensitivity): 4 ng/L (ref <18)
Troponin I (High Sensitivity): 5 ng/L (ref <18)

## 2020-12-23 LAB — LIPASE, BLOOD: Lipase: 26 U/L (ref 11–51)

## 2020-12-23 LAB — TSH: TSH: 2.661 u[IU]/mL (ref 0.350–4.500)

## 2020-12-23 NOTE — ED Notes (Signed)
D/C discussed with pt and husband. Husband and pt both verbalized understanding. NAD noted. VSS.

## 2020-12-23 NOTE — ED Provider Notes (Signed)
[facility] Emergency Department Provider Note   ____________________________________________   Event Date/Time   First MD Initiated Contact with Patient 12/23/20 1509     (approximate)  I have reviewed the triage vital signs and the nursing notes.   HISTORY  Chief Complaint Weakness    HPI Katherine Carpenter is a 60 y.o. female with past medical history of hypertension, hyperlipidemia, and bipolar disorder who presents to the ED complaining of generalized weakness and fatigue.  Patient reports that she has been feeling increasingly weak and fatigued for about the past 2 weeks.  She denies any associated fevers, cough, chest pain, shortness of breath, nausea, vomiting, abdominal pain, dysuria, or hematuria.  She does state that she has noticed her stools have been darker than usual, although she has been taking Pepto-Bismol.  She does not take any blood thinners but denies any history of GI bleed.  She does state that she has been taking an over-the-counter dietary supplement for weight loss.        Past Medical History:  Diagnosis Date   Depression    Hypertension     Patient Active Problem List   Diagnosis Date Noted   Eczema 07/09/2020   Gastric irritation    Balance problem 10/15/2019   Nausea 05/27/2019   Suspected COVID-19 virus infection 05/27/2019   Abdominal pain 04/01/2019   GERD (gastroesophageal reflux disease) 10/09/2018   Screening for cervical cancer 08/01/2018   Medicare annual wellness visit, subsequent 05/13/2018   Hyperlipidemia 05/08/2017   Essential hypertension 05/08/2017   Bipolar disorder ([place]) 05/08/2017   Rosacea 05/08/2017    Past Surgical History:  Procedure Laterality Date   CERVICAL BIOPSY  W/ LOOP ELECTRODE EXCISION  2016   ESOPHAGOGASTRODUODENOSCOPY (EGD) WITH PROPOFOL N/A 10/27/2019   Procedure: ESOPHAGOGASTRODUODENOSCOPY (EGD) WITH PROPOFOL;  Surgeon: Virgel Manifold, MD;  Location: [facility] ENDOSCOPY;   Service: Endoscopy;  Laterality: N/A;   TUBAL LIGATION      Prior to Admission medications   Medication Sig Start Date End Date Taking? Authorizing Provider  ALPRAZolam Duanne Moron) 1 MG tablet Take 1 mg by mouth 5 (five) times daily. Patient may take up to 5 times a day    [provider]  amphetamine-dextroamphetamine (ADDERALL XR) 20 MG 24 hr capsule Take 20 mg by mouth daily.    [provider]  augmented betamethasone dipropionate (DIPROLENE-AF) 0.05 % cream Apply topically 2 (two) times daily.    [provider]  buPROPion (WELLBUTRIN XL) 150 MG 24 hr tablet Take 150 mg by mouth daily.    [provider]  Calcium Carbonate-Vitamin D 500-125 MG-UNIT TABS Take by mouth.    [provider]  carbamazepine (TEGRETOL) 200 MG tablet TAKE ONE TABLET BY MOUTH EVERY MORNING AND THREE TABLETS AT BEDTIME 07/15/19   [provider]  doxycycline (PERIOSTAT) 20 MG tablet Take 1 tablet (20 mg total) by mouth 2 (two) times daily. With food 12/01/20 03/01/21  Moye, [place], MD  Dupilumab (DUPIXENT) 300 MG/2ML SOPN Inject 300 mg into the skin every 14 (fourteen) days. Starting at day 15 for maintenance. 11/17/20   Moye, [place], MD  fluticasone (FLONASE) 50 MCG/ACT nasal spray Place 1 spray into both nostrils 2 (two) times daily. 10/15/19   Pleas Koch, NP  lamoTRIgine (LAMICTAL) 200 MG tablet Take 100-200 mg by mouth 2 (two) times daily. 1/2 tablet every morning and 1 tablet every evening    [provider]  metoprolol succinate (TOPROL-XL) 25 MG 24  hr tablet Take 0.5 tablets (12.5 mg total) by mouth daily. For heart rate and blood pressure. 08/29/20   Pleas Koch, NP  metroNIDAZOLE (METROCREAM) 0.75 % cream Apply topically.    [provider]  Multiple Vitamin (MULTI-VITAMINS) TABS Take by mouth.    [provider]  olanzapine-FLUoxetine (SYMBYAX) 12-25 MG per capsule Take 1 capsule by mouth every evening.    [provider]  omeprazole (PRILOSEC) 20 MG capsule TAKE 1 CAPSULE (20 MG TOTAL) BY MOUTH 2 (TWO) TIMES DAILY BEFORE A MEAL. FOR HEARTBURN. 09/28/20   Pleas Koch, NP  ondansetron (ZOFRAN-ODT) 8 MG disintegrating tablet Take 1 tablet (8 mg total) by mouth every 8 (eight) hours as needed for nausea. 02/02/20   Elby Beck, FNP  QUEtiapine (SEROQUEL XR) 200 MG 24 hr tablet Take 1,000 mg by mouth at bedtime. Patient takes 5 tablets by mouth every night at bedtime    [provider]  simvastatin (ZOCOR) 10 MG tablet Take 1 tablet (10 mg total) by mouth daily. For cholesterol. 08/29/20   Pleas Koch, NP  SOOLANTRA 1 % CREA Apply 1 application topically daily. 12/01/20   Alfonso Patten, MD    Allergies Patient has no known allergies.  Family History  Problem Relation Age of Onset   Asthma Mother    Depression Daughter    Arthritis Maternal Grandmother    Cancer Maternal Grandmother        lung   Cancer Paternal Grandmother    Leukemia Paternal Grandmother     Social History Social History   Tobacco Use   Smoking status: Never   Smokeless tobacco: Never  Substance Use Topics   Alcohol use: No    Comment: Pt denies   Drug use: No    Comment: Pt denies    Review of Systems  Constitutional: No fever/chills.  Positive for generalized weakness and fatigue. Eyes: No visual changes. ENT: No sore throat. Cardiovascular: Denies chest pain. Respiratory: Denies shortness of breath. Gastrointestinal: No abdominal pain.  No nausea, no vomiting.  No diarrhea.  No constipation. Genitourinary: Negative for dysuria. Musculoskeletal: Negative for back pain. Skin: Negative for rash. Neurological: Negative for headaches, focal weakness or numbness.  ____________________________________________   PHYSICAL EXAM:  VITAL SIGNS: ED Triage Vitals  Enc Vitals Group     BP 12/23/20 1207 136/86     Pulse Rate 12/23/20 1207 (!) 112     Resp 12/23/20 1207 18     Temp  12/23/20 1207 98.4 F (36.9 C)     Temp Source 12/23/20 1207 Oral     SpO2 12/23/20 1207 97 %     Weight 12/23/20 1204 229 lb (103.9 kg)     Height 12/23/20 1204 5\' 3"  (1.6 m)     Head Circumference --      Peak Flow --      Pain Score 12/23/20 1204 0     Pain Loc --      Pain Edu? --      Excl. in [place]? --     Constitutional: Alert and oriented. Eyes: Conjunctivae are normal. Head: Atraumatic. Nose: No congestion/rhinnorhea. Mouth/Throat: Mucous membranes are moist. Neck: Normal ROM Cardiovascular: Normal rate, regular rhythm. Grossly normal heart sounds.  2+ radial pulses bilaterally. Respiratory: Normal respiratory effort.  No retractions. Lungs CTAB. Gastrointestinal: Soft and nontender. No distention. Genitourinary: deferred Musculoskeletal: No lower extremity tenderness nor edema. Neurologic:  Normal speech and language. No gross focal neurologic deficits are appreciated.  Skin:  Skin is warm, dry and intact. No rash noted. Psychiatric: Mood and affect are normal. Speech and behavior are normal.  ____________________________________________   LABS (all labs ordered are listed, but only abnormal results are displayed)  Labs Reviewed  COMPREHENSIVE METABOLIC PANEL - Abnormal; Notable for the following components:      Result Value   Glucose, Bld 125 (*)    All other components within normal limits  URINALYSIS, COMPLETE (UACMP) WITH MICROSCOPIC - Abnormal; Notable for the following components:   Color, Urine YELLOW (*)    APPearance CLOUDY (*)    Leukocytes,Ua LARGE (*)    Bacteria, UA FEW (*)    All other components within normal limits  CBC WITH DIFFERENTIAL/PLATELET  LIPASE, BLOOD  TSH  TYPE AND SCREEN  TROPONIN I (HIGH SENSITIVITY)  TROPONIN I (HIGH SENSITIVITY)   ____________________________________________  EKG  ED ECG REPORT I, Blake Divine, the attending physician, personally viewed and interpreted this ECG.   Date: 12/23/2020  EKG Time: 12:19   Rate: 109  Rhythm: sinus tachycardia  Axis: Normal  Intervals:none  ST&T Change: None   PROCEDURES  Procedure(s) performed (including Critical Care):  Procedures   ____________________________________________   INITIAL IMPRESSION / ASSESSMENT AND PLAN / ED COURSE      60 year old female with past medical history of hypertension, hyperlipidemia, and bipolar disorder presents to the ED with 2 weeks of increasing generalized weakness associated with dark stools.  Patient's hemoglobin is stable and I doubt significant GI bleeding, darker stools likely related to Pepto-Bismol usage.  Remainder of labs are unremarkable, EKG shows no evidence of arrhythmia or ischemia and 2 sets of troponin are negative.  Patient was counseled to stop dietary supplement and to follow-up with her PCP, otherwise return to the ED for new worsening symptoms.  Patient agrees with plan.      ____________________________________________   FINAL CLINICAL IMPRESSION(S) / ED DIAGNOSES  Final diagnoses:  Generalized weakness  Fatigue, unspecified type     ED Discharge Orders     None        Note:  This document was prepared using Dragon voice recognition software and may include unintentional dictation errors.    Blake Divine, MD 12/23/20 [id number]

## 2020-12-23 NOTE — Telephone Encounter (Signed)
Received fax note from Access nurse that is not in Access nurse portal. Pt was complaining with feeling lethargic, nauseated, and pt does not feel like herself. Pt is presently at [facility] ED per chart review tab. Sending access nurse note for scanning and will take copy of note to Dr Cody's in box who is covering for Gentry Fitz NP. Sending note to Dr Einar Pheasant and Gentry Fitz NP.

## 2020-12-23 NOTE — ED Provider Notes (Signed)
HPI: Pt is a 60 y.o. female who presents with complaints of weakness.   The patient p/w  weakness and fatigue and dark stools for 1-2 weeks.   ROS: Denies fever, chest pain, vomiting  Past Medical History:  Diagnosis Date   Depression    Hypertension    There were no vitals filed for this visit.  Focused Physical Exam: Gen: No acute distress Head: atraumatic, normocephalic Eyes: Extraocular movements grossly intact; conjunctiva clear CV: RRR Lung: No increased WOB, no stridor GI: ND, no obvious masses Neuro: Alert and awake  Medical Decision Making and Plan: Given the patient's initial medical screening exam, the following diagnostic evaluation has been ordered. The patient will be placed in the appropriate treatment space, once one is available, to complete the evaluation and treatment. I have discussed the plan of care with the patient and I have advised the patient that an ED physician or mid-level practitioner will reevaluate their condition after the test results have been received, as the results may give them additional insight into the type of treatment they may need.   Diagnostics: labs, ekg   Treatments: none immediately   Vanessa [place], MD 12/23/20 1204

## 2020-12-23 NOTE — ED Triage Notes (Signed)
Presents with weakness and fatigue with some nausea   states she noticed dark stools

## 2020-12-23 NOTE — Telephone Encounter (Signed)
Noted, agree with need for evaluation

## 2021-02-02 ENCOUNTER — Other Ambulatory Visit: Payer: Self-pay

## 2021-02-02 ENCOUNTER — Ambulatory Visit (INDEPENDENT_AMBULATORY_CARE_PROVIDER_SITE_OTHER): Payer: Medicare Other | Admitting: Dermatology

## 2021-02-02 DIAGNOSIS — L308 Other specified dermatitis: Secondary | ICD-10-CM

## 2021-02-02 DIAGNOSIS — Z79899 Other long term (current) drug therapy: Secondary | ICD-10-CM | POA: Diagnosis not present

## 2021-02-02 DIAGNOSIS — L719 Rosacea, unspecified: Secondary | ICD-10-CM

## 2021-02-02 MED ORDER — METRONIDAZOLE 0.75 % EX GEL: 1.0000 "application " | Freq: Two times a day (BID) | CUTANEOUS | 2 refills | Status: DC

## 2021-02-02 NOTE — Progress Notes (Signed)
   Follow-Up Visit   Subjective  Katherine Carpenter is a 60 y.o. female who presents for the following: Follow-up (Patient here today for 3 month eczema and rosacea follow up. Patient is currently on [place] and advises she is well controlled. Patient taking doxycycline '20mg'$  BID for rosacea but has had some breaking out. Topicals (soolantra) were too expensive. ).  The following portions of the chart were reviewed this encounter and updated as appropriate:   Tobacco  Allergies  Meds  Problems  Med Hx  Surg Hx  Fam Hx      Review of Systems:  No other skin or systemic complaints except as noted in HPI or Assessment and Plan.  Objective  Well appearing patient in no apparent distress; mood and affect are within normal limits.  A focused examination was performed including face. Lower legs. Relevant physical exam findings are noted in the Assessment and Plan.  face Mid face erythema with few inflammatory papules at lower face  Right Ankle - Anterior Clear    Assessment & Plan  Rosacea face  Chronic condition with duration or expected duration over one year. Condition is bothersome to patient. Currently flared.  Rosacea is a chronic progressive skin condition usually affecting the face of adults, causing redness and/or acne bumps. It is treatable but not curable. It sometimes affects the eyes (ocular rosacea) as well. It may respond to topical and/or systemic medication and can flare with stress, sun exposure, alcohol, exercise and some foods.  Daily application of broad spectrum spf 30+ sunscreen to face is recommended to reduce flares.  Start metronidazole 0.75% gel twice daily  Continue doxycycline '20mg'$  twice daily with food  Doxycycline should be taken with food to prevent nausea. Do not lay down for 30 minutes after taking. Be cautious with sun exposure and use good sun protection while on this medication. Pregnant women should not take this medication.    metroNIDAZOLE  (METROGEL) 0.75 % gel - face Apply 1 application topically 2 (two) times daily.  Related Medications doxycycline (PERIOSTAT) 20 MG tablet Take 1 tablet (20 mg total) by mouth 2 (two) times daily. With food  Other eczema Right Ankle - Anterior  Chronic condition with duration or expected duration over one year. Currently well-controlled.  Continue Dupixent '300mg'$ /mL sq q 2weeks  Dupilumab (Dupixent) is a treatment given by injection for adults with moderate-to-severe atopic dermatitis. Goal is control of skin condition, not cure. It is given as 2 injections at the first dose followed by 1 injection ever 2 weeks thereafter.  Potential side effects include allergic reaction, herpes infections, injection site reactions and conjunctivitis (inflammation of the eyes).  The use of Dupixent requires long term medication management, including periodic office visits.   Return in about 4 months (around 06/04/2021) for Rosacea, Eczema.  Graciella Belton, RMA, am acting as scribe for Forest Gleason, MD .  Documentation: I have reviewed the above documentation for accuracy and completeness, and I agree with the above.  Forest Gleason, MD

## 2021-02-02 NOTE — Patient Instructions (Addendum)
Dupilumab (Dupixent) is a treatment given by injection for adults with moderate-to-severe atopic dermatitis. Goal is control of skin condition, not cure. It is given as 2 injections at the first dose followed by 1 injection ever 2 weeks thereafter.  Potential side effects include allergic reaction, herpes infections, injection site reactions and conjunctivitis (inflammation of the eyes).  The use of Dupixent requires long term medication management, including periodic office visits.  If you have any questions or concerns for your doctor, please call our main line at [phone] and press option 4 to reach your doctor's medical assistant. If no one answers, please leave a voicemail as directed and we will return your call as soon as possible. Messages left after 4 pm will be answered the following business day.   You may also send [place] a message via [place]. We typically respond to MyChart messages within 1-2 business days.  For prescription refills, please ask your pharmacy to contact our office. Our fax number is [phone].  If you have an urgent issue when the clinic is closed that cannot wait until the next business day, you can page your doctor at the number below.    Please note that while we do our best to be available for urgent issues outside of office hours, we are not available 24/7.   If you have an urgent issue and are unable to reach [place], you may choose to seek medical care at your doctor's office, retail clinic, urgent care center, or emergency room.  If you have a medical emergency, please immediately call 911 or go to the emergency department.  Pager Numbers  - Dr. Nehemiah Massed: [phone]  - Dr. Laurence Ferrari: [phone]  - Dr. Nicole Kindred: [phone]  In the event of inclement weather, please call our main line at [phone] for an update on the status of any delays or closures.  Dermatology Medication Tips: Please keep the boxes that topical medications come in in order to  help keep track of the instructions about where and how to use these. Pharmacies typically print the medication instructions only on the boxes and not directly on the medication tubes.   If your medication is too expensive, please contact our office at [phone] option 4 or send [place] a message through [place].   We are unable to tell what your co-pay for medications will be in advance as this is different depending on your insurance coverage. However, we may be able to find a substitute medication at lower cost or fill out paperwork to get insurance to cover a needed medication.   If a prior authorization is required to get your medication covered by your insurance company, please allow [place] 1-2 business days to complete this process.  Drug prices often vary depending on where the prescription is filled and some pharmacies may offer cheaper prices.  The website www.goodrx.com contains coupons for medications through different pharmacies. The prices here do not account for what the cost may be with help from insurance (it may be cheaper with your insurance), but the website can give you the price if you did not use any insurance.  - You can print the associated coupon and take it with your prescription to the pharmacy.  - You may also stop by our office during regular business hours and pick up a GoodRx coupon card.  - If you need your prescription sent electronically to a different pharmacy, notify our office through [facility] or by phone at [phone] option 4.

## 2021-02-06 ENCOUNTER — Encounter: Payer: Self-pay | Admitting: Dermatology

## 2021-02-25 ENCOUNTER — Other Ambulatory Visit: Payer: Self-pay | Admitting: Dermatology

## 2021-02-25 DIAGNOSIS — L719 Rosacea, unspecified: Secondary | ICD-10-CM

## 2021-03-30 ENCOUNTER — Other Ambulatory Visit: Payer: Self-pay

## 2021-03-30 ENCOUNTER — Other Ambulatory Visit: Payer: Self-pay | Admitting: Primary Care

## 2021-03-30 ENCOUNTER — Encounter: Payer: Self-pay | Admitting: Dermatology

## 2021-03-30 DIAGNOSIS — L719 Rosacea, unspecified: Secondary | ICD-10-CM

## 2021-03-30 DIAGNOSIS — K219 Gastro-esophageal reflux disease without esophagitis: Secondary | ICD-10-CM

## 2021-03-30 MED ORDER — DOXYCYCLINE HYCLATE 20 MG PO TABS: 20.0000 mg | ORAL_TABLET | Freq: Two times a day (BID) | ORAL | 1 refills | Status: DC

## 2021-03-30 MED ORDER — OMEPRAZOLE 40 MG PO CPDR: 40.0000 mg | DELAYED_RELEASE_CAPSULE | Freq: Every day | ORAL | 0 refills | Status: DC

## 2021-03-30 NOTE — Progress Notes (Signed)
Doxycycline RF to a new pharmacy. aw

## 2021-03-31 NOTE — Telephone Encounter (Signed)
Hey Katherine Carpenter,  Can you get this added in to her chart?  Thank you!

## 2021-04-19 NOTE — Telephone Encounter (Signed)
Do you want her to make office visit?

## 2021-04-29 ENCOUNTER — Other Ambulatory Visit: Payer: Self-pay

## 2021-04-29 ENCOUNTER — Telehealth: Payer: Medicare Other | Admitting: Primary Care

## 2021-05-03 ENCOUNTER — Telehealth: Payer: Self-pay

## 2021-05-03 NOTE — Telephone Encounter (Signed)
Pt called requesting samples of Dupixent injections, okay for sample box of Dupixent pt will come in the office to pick up sample

## 2021-05-04 ENCOUNTER — Telehealth: Payer: Self-pay

## 2021-05-04 NOTE — Telephone Encounter (Signed)
Patient recently switched from [facility] to [facility] for secondary insurance. Patient has been denied through [employer] for [place]. Dupixent My Way Enrollment completed today in office and sample given to patient to take home so she has no delay in therapy.   LOT: [id number] EXP: 06/2023

## 2021-05-30 ENCOUNTER — Ambulatory Visit: Payer: Medicare Other | Admitting: Dermatology

## 2021-05-30 ENCOUNTER — Other Ambulatory Visit: Payer: Self-pay

## 2021-05-30 DIAGNOSIS — L309 Dermatitis, unspecified: Secondary | ICD-10-CM

## 2021-05-30 MED ORDER — DUPIXENT 300 MG/2ML ~~LOC~~ SOAJ: 300.0000 mg | SUBCUTANEOUS | 0 refills | Status: DC

## 2021-05-30 NOTE — Progress Notes (Signed)
RX to [employer] for patient assistance.

## 2021-06-02 ENCOUNTER — Other Ambulatory Visit: Payer: Self-pay

## 2021-06-02 ENCOUNTER — Ambulatory Visit: Payer: 59 | Admitting: Dermatology

## 2021-06-02 ENCOUNTER — Telehealth (INDEPENDENT_AMBULATORY_CARE_PROVIDER_SITE_OTHER): Payer: Medicare Other | Admitting: Primary Care

## 2021-06-02 ENCOUNTER — Encounter: Payer: Self-pay | Admitting: Primary Care

## 2021-06-02 VITALS — Ht 63.0 in | Wt 201.0 lb

## 2021-06-02 DIAGNOSIS — R5383 Other fatigue: Secondary | ICD-10-CM | POA: Diagnosis not present

## 2021-06-02 NOTE — Progress Notes (Signed)
Patient ID: Katherine Carpenter, female    DOB: 1960/07/30, 60 y.o.   MRN: [id number]  Virtual visit completed through [place], a video enabled telemedicine application. Due to national recommendations of social distancing due to COVID-19, a virtual visit is felt to be most appropriate for this patient at this time. Reviewed limitations, risks, security and privacy concerns of performing a virtual visit and the availability of in person appointments. I also reviewed that there may be a patient responsible charge related to this service. The patient agreed to proceed.   Patient location: home Provider location: [place] [facility], office Persons participating in this virtual visit: patient, provider   If any vitals were documented, they were collected by patient at home unless specified below.    Ht 5\' 3"  (1.6 m)    Wt 201 lb (91.2 kg)    LMP 09/08/2011    BMI 35.61 kg/m    CC: Sore Throat Subjective:   HPI: Katherine Carpenter is a 60 y.o. female with a history of hypertension, GERD, hyperlipidemia, Covid-19 infection presenting on 06/02/2021 to discuss sore throat.  Symptom onset three days ago with sore throat, post nasal drip. Since then has developed nasal congestion and fatigue. She took some Alka-Seltzer Cold yesterday and symptoms of sore throat, drainage, congestion have resolved. She's feeling well except for fatigue.  She's questioning whether she is coming off of a Bipolar manic episode as she feels very fatigued now. She's recently had episodes of mania which include irrational choices, spending lots of money, hyperactivity. She is now feeling like she's coming down from this episode, typically feels fatigued when she goes through her episodes. She typically has episodes twice annually, around Easter and Christmas. No medication changes from her psychiatrist. Is sleeping well, too much during the day. Is on disability and does not work.  She denies vaginal bleeding, blood  stools. She endorses losing 35 pounds since May 2022, has been intentionally working on weight loss, is eating two meals daily. She is wanting some baseline labs checked to ensure she is not anemic.       Relevant past medical, surgical, family and social history reviewed and updated as indicated. Interim medical history since our last visit reviewed. Allergies and medications reviewed and updated. Outpatient Medications Prior to Visit  Medication Sig Dispense Refill   ALPRAZolam (XANAX) 1 MG tablet Take 1 mg by mouth 5 (five) times daily. Patient may take up to 5 times a day     amphetamine-dextroamphetamine (ADDERALL XR) 20 MG 24 hr capsule Take 20 mg by mouth daily.     augmented betamethasone dipropionate (DIPROLENE-AF) 0.05 % cream Apply topically 2 (two) times daily.     buPROPion (WELLBUTRIN XL) 150 MG 24 hr tablet Take 150 mg by mouth daily.     Calcium Carbonate-Vitamin D 500-125 MG-UNIT TABS Take by mouth.     carbamazepine (TEGRETOL) 200 MG tablet TAKE ONE TABLET BY MOUTH EVERY MORNING AND THREE TABLETS AT BEDTIME     doxycycline (PERIOSTAT) 20 MG tablet Take 1 tablet (20 mg total) by mouth 2 (two) times daily. With food 60 tablet 1   Dupilumab (DUPIXENT) 300 MG/2ML SOPN Inject 300 mg into the skin every 14 (fourteen) days. Starting at day 15 for maintenance. 12 mL 0   fluticasone (FLONASE) 50 MCG/ACT nasal spray Place 1 spray into both nostrils 2 (two) times daily. 16 g 0   lamoTRIgine (LAMICTAL) 200 MG tablet Take 100-200 mg by mouth 2 (  two) times daily. 1/2 tablet every morning and 1 tablet every evening     metoprolol succinate (TOPROL-XL) 25 MG 24 hr tablet Take 0.5 tablets (12.5 mg total) by mouth daily. For heart rate and blood pressure. 45 tablet 3   Multiple Vitamin (MULTI-VITAMINS) TABS Take by mouth.     olanzapine-FLUoxetine (SYMBYAX) 12-25 MG per capsule Take 1 capsule by mouth every evening.     omeprazole (PRILOSEC) 40 MG capsule Take 40 mg by mouth daily.      ondansetron (ZOFRAN-ODT) 8 MG disintegrating tablet Take 1 tablet (8 mg total) by mouth every 8 (eight) hours as needed for nausea. 20 tablet 0   QUEtiapine (SEROQUEL XR) 200 MG 24 hr tablet Take 1,000 mg by mouth at bedtime. Patient takes 5 tablets by mouth every night at bedtime     simvastatin (ZOCOR) 10 MG tablet Take 1 tablet (10 mg total) by mouth daily. For cholesterol. 90 tablet 3   metroNIDAZOLE (METROGEL) 0.75 % gel Apply 1 application topically 2 (two) times daily. (Patient not taking: Reported on 06/02/2021) 45 g 2   No facility-administered medications prior to visit.     Per HPI unless specifically indicated in ROS section below Review of Systems  Constitutional:  Positive for fatigue. Negative for fever.  HENT:  Negative for congestion, postnasal drip and sore throat.   Respiratory:  Negative for cough and shortness of breath.   Cardiovascular:  Negative for chest pain.  Psychiatric/Behavioral:         See HPI  Objective:  Ht 5\' 3"  (1.6 m)    Wt 201 lb (91.2 kg)    LMP 09/08/2011    BMI 35.61 kg/m   Wt Readings from Last 3 Encounters:  06/02/21 201 lb (91.2 kg)  12/23/20 229 lb (103.9 kg)  09/16/20 239 lb 8 oz (108.6 kg)       Physical exam: General: Alert and oriented x 3, no distress, does not appear sickly  Pulmonary: Speaks in complete sentences without increased work of breathing, no cough during visit.  Psychiatric: Normal mood, thought content, and behavior.     Results for orders placed or performed during the hospital encounter of 12/23/20  CBC with Differential  Result Value Ref Range   WBC 6.8 4.0 - 10.5 K/uL   RBC 4.94 3.87 - 5.11 MIL/uL   Hemoglobin 14.1 12.0 - 15.0 g/dL   HCT 42.1 36.0 - 46.0 %   MCV 85.2 80.0 - 100.0 fL   MCH 28.5 26.0 - 34.0 pg   MCHC 33.5 30.0 - 36.0 g/dL   RDW 13.2 11.5 - 15.5 %   Platelets 251 150 - 400 K/uL   nRBC 0.0 0.0 - 0.2 %   Neutrophils Relative % 62 %   Neutro Abs 4.3 1.7 - 7.7 K/uL   Lymphocytes Relative 28  %   Lymphs Abs 1.9 0.7 - 4.0 K/uL   Monocytes Relative 7 %   Monocytes Absolute 0.5 0.1 - 1.0 K/uL   Eosinophils Relative 2 %   Eosinophils Absolute 0.1 0.0 - 0.5 K/uL   Basophils Relative 0 %   Basophils Absolute 0.0 0.0 - 0.1 K/uL   Immature Granulocytes 1 %   Abs Immature Granulocytes 0.04 0.00 - 0.07 K/uL  Comprehensive metabolic panel  Result Value Ref Range   Sodium 139 135 - 145 mmol/L   Potassium 4.0 3.5 - 5.1 mmol/L   Chloride 105 98 - 111 mmol/L   CO2 25 22 - 32 mmol/L  Glucose, Bld 125 (H) 70 - 99 mg/dL   BUN 19 6 - 20 mg/dL   Creatinine, Ser 0.80 0.44 - 1.00 mg/dL   Calcium 9.4 8.9 - 10.3 mg/dL   Total Protein 7.3 6.5 - 8.1 g/dL   Albumin 4.0 3.5 - 5.0 g/dL   AST 19 15 - 41 U/L   ALT 21 0 - 44 U/L   Alkaline Phosphatase 112 38 - 126 U/L   Total Bilirubin 0.5 0.3 - 1.2 mg/dL   GFR, Estimated >60 >60 mL/min   Anion gap 9 5 - 15  Lipase, blood  Result Value Ref Range   Lipase 26 11 - 51 U/L  Urinalysis, Complete w Microscopic Urine, Random  Result Value Ref Range   Color, Urine YELLOW (A) YELLOW   APPearance CLOUDY (A) CLEAR   Specific Gravity, Urine 1.024 1.005 - 1.030   pH 5.0 5.0 - 8.0   Glucose, UA NEGATIVE NEGATIVE mg/dL   Hgb urine dipstick NEGATIVE NEGATIVE   Bilirubin Urine NEGATIVE NEGATIVE   Ketones, ur NEGATIVE NEGATIVE mg/dL   Protein, ur NEGATIVE NEGATIVE mg/dL   Nitrite NEGATIVE NEGATIVE   Leukocytes,Ua LARGE (A) NEGATIVE   RBC / HPF 0-5 0 - 5 RBC/hpf   WBC, UA 21-50 0 - 5 WBC/hpf   Bacteria, UA FEW (A) NONE SEEN   Squamous Epithelial / LPF 21-50 0 - 5   Mucus PRESENT    Hyaline Casts, UA PRESENT   TSH  Result Value Ref Range   TSH 2.661 0.350 - 4.500 uIU/mL  Type and screen [facility]  Result Value Ref Range   ABO/RH(D) A POS    Antibody Screen NEG    Sample Expiration      12/26/2020,2359 Performed at [facility], [address] [place], [place] [address]   Troponin I (High Sensitivity)  Result  Value Ref Range   Troponin I (High Sensitivity) 4 <18 ng/L  Troponin I (High Sensitivity)  Result Value Ref Range   Troponin I (High Sensitivity) 5 <18 ng/L   Assessment & Plan:   Problem List Items Addressed This Visit       Other   Other fatigue - Primary    Likely secondary to coming off of a manic episode. She appears stable and mood/behavior is appropriate.   Will check CBC and BMP today. TSH has historically been within normal range. No history of B12 deficiency.  Recommend she reach out to her psychiatrist regarding her recent episode if needed.  Await results. She will reach back out if her other symptoms return.      Relevant Orders   CBC   Basic metabolic panel     No orders of the defined types were placed in this encounter.  Orders Placed This Encounter  Procedures   CBC    Standing Status:   Future    Standing Expiration Date:   82/50/5397   Basic metabolic panel    Standing Status:   Future    Standing Expiration Date:   06/02/2022    I discussed the assessment and treatment plan with the patient. The patient was provided an opportunity to ask questions and all were answered. The patient agreed with the plan and demonstrated an understanding of the instructions. The patient was advised to call back or seek an in-person evaluation if the symptoms worsen or if the condition fails to improve as anticipated.  Follow up plan:  Schedule a lab appointment as discussed.  Please  reach back out if your symptoms return!  Allie Bossier, NP-C   Pleas Koch, NP

## 2021-06-02 NOTE — Patient Instructions (Signed)
Schedule a lab appointment as discussed.  Please reach back out if your symptoms return!  Allie Bossier, NP-C

## 2021-06-02 NOTE — Assessment & Plan Note (Signed)
Likely secondary to coming off of a manic episode. She appears stable and mood/behavior is appropriate.   Will check CBC and BMP today. TSH has historically been within normal range. No history of B12 deficiency.  Recommend she reach out to her psychiatrist regarding her recent episode if needed.  Await results. She will reach back out if her other symptoms return.

## 2021-06-07 ENCOUNTER — Other Ambulatory Visit: Payer: Self-pay

## 2021-06-07 ENCOUNTER — Other Ambulatory Visit (INDEPENDENT_AMBULATORY_CARE_PROVIDER_SITE_OTHER): Payer: Medicare Other

## 2021-06-07 DIAGNOSIS — R5383 Other fatigue: Secondary | ICD-10-CM | POA: Diagnosis not present

## 2021-06-07 LAB — BASIC METABOLIC PANEL
BUN: 25 mg/dL — ABNORMAL HIGH (ref 6–23)
CO2: 28 mEq/L (ref 19–32)
Calcium: 9.5 mg/dL (ref 8.4–10.5)
Chloride: 100 mEq/L (ref 96–112)
Creatinine, Ser: 0.89 mg/dL (ref 0.40–1.20)
GFR: 70.46 mL/min (ref >60.00)
Glucose, Bld: 118 mg/dL — ABNORMAL HIGH (ref 70–99)
Potassium: 4.3 mEq/L (ref 3.5–5.1)
Sodium: 138 mEq/L (ref 135–145)

## 2021-06-07 LAB — CBC
HCT: 40.6 % (ref 36.0–46.0)
Hemoglobin: 13.7 g/dL (ref 12.0–15.0)
MCHC: 33.8 g/dL (ref 30.0–36.0)
MCV: 86.9 fl (ref 78.0–100.0)
Platelets: 248 10*3/uL (ref 150.0–400.0)
RBC: 4.67 Mil/uL (ref 3.87–5.11)
RDW: 13.8 % (ref 11.5–15.5)
WBC: 7.5 10*3/uL (ref 4.0–10.5)

## 2021-06-08 ENCOUNTER — Other Ambulatory Visit (INDEPENDENT_AMBULATORY_CARE_PROVIDER_SITE_OTHER): Payer: Medicare Other

## 2021-06-08 DIAGNOSIS — R7303 Prediabetes: Secondary | ICD-10-CM

## 2021-06-08 LAB — HEMOGLOBIN A1C: Hgb A1c MFr Bld: 6.1 % (ref 4.6–6.5)

## 2021-06-09 ENCOUNTER — Other Ambulatory Visit: Payer: Self-pay

## 2021-06-09 ENCOUNTER — Encounter: Payer: Self-pay | Admitting: Dermatology

## 2021-06-09 ENCOUNTER — Ambulatory Visit (INDEPENDENT_AMBULATORY_CARE_PROVIDER_SITE_OTHER): Payer: Medicare Other | Admitting: Dermatology

## 2021-06-09 DIAGNOSIS — Z79899 Other long term (current) drug therapy: Secondary | ICD-10-CM | POA: Diagnosis not present

## 2021-06-09 DIAGNOSIS — L719 Rosacea, unspecified: Secondary | ICD-10-CM

## 2021-06-09 DIAGNOSIS — L308 Other specified dermatitis: Secondary | ICD-10-CM | POA: Diagnosis not present

## 2021-06-09 MED ORDER — DOXYCYCLINE HYCLATE 20 MG PO TABS: 20.0000 mg | ORAL_TABLET | Freq: Two times a day (BID) | ORAL | 1 refills | Status: AC

## 2021-06-09 MED ORDER — IVERMECTIN 1 % EX CREA: TOPICAL_CREAM | CUTANEOUS | 2 refills | Status: DC

## 2021-06-09 MED ORDER — RHOFADE 1 % EX CREA: TOPICAL_CREAM | CUTANEOUS | 2 refills | Status: DC

## 2021-06-09 NOTE — Progress Notes (Signed)
Follow-Up Visit   Subjective  Katherine Carpenter is a 60 y.o. female who presents for the following: Follow-up (Patient here today for 4 month rosacea and eczema follow up. Patient is currently using metronidazole 0.75% gel once daily and taking doxycycline 20 mg twice daily for rosacea but feels that it is mostly controlled. She is also on Dupixent for her eczema but new insurance has denied. Patient has been getting samples thru our office while appeal is being done. Her last shot was 2-3 weeks ago. ).  Patient has appeal call with [employer] later this month.   The following portions of the chart were reviewed this encounter and updated as appropriate:   Tobacco   Allergies   Meds   Problems   Med Hx   Surg Hx   Fam Hx       Review of Systems:  No other skin or systemic complaints except as noted in HPI or Assessment and Plan.  Objective  Well appearing patient in no apparent distress; mood and affect are within normal limits.  A focused examination was performed including face. Relevant physical exam findings are noted in the Assessment and Plan.  face Mid face erythema, few resolving inflammatory papules  legs Clear today    Assessment & Plan  Rosacea face  Rosacea is a chronic progressive skin condition usually affecting the face of adults, causing redness and/or acne bumps. It is treatable but not curable. It sometimes affects the eyes (ocular rosacea) as well. It may respond to topical and/or systemic medication and can flare with stress, sun exposure, alcohol, exercise and some foods.  Daily application of broad spectrum spf 30+ sunscreen to face is recommended to reduce flares.  Chronic condition with duration over one year. Condition is bothersome to patient. Not currently at goal.  Continue doxycycline 20 mg twice daily with food.  May continue metronidazole once daily.  Start Rhofade daily in the morning.  Start Soolantra once daily at bedtime.  If not covered, consider  [employer] oxymetazoline-ivermectin compounded cream.  Doxycycline should be taken with food to prevent nausea. Do not lay down for 30 minutes after taking. Be cautious with sun exposure and use good sun protection while on this medication. Pregnant women should not take this medication.    Oxymetazoline HCl (RHOFADE) 1 % CREA - face Apply once daily in the morning.  Ivermectin (SOOLANTRA) 1 % CREA - face Apply to face once daily  Related Medications doxycycline (PERIOSTAT) 20 MG tablet Take 1 tablet (20 mg total) by mouth 2 (two) times daily. With food  Other eczema legs  Well controlled on Dupixent after severe disease for 15 years despite multiple other therapies.  Continue Dupixent 300mg /57mL every 2 weeks. Sample given to patient to take home and inject.  Lot # [id number]  Exp: 04/2023  Dupilumab (Dupixent) is a treatment given by injection for adults and children with moderate-to-severe atopic dermatitis. Goal is control of skin condition, not cure. It is given as 2 injections at the first dose followed by 1 injection ever 2 weeks thereafter.  Young children are dosed monthly.  Potential side effects include allergic reaction, herpes infections, injection site reactions and conjunctivitis (inflammation of the eyes).  The use of Dupixent requires long term medication management, including periodic office visits.    Return in about 1 year (around 06/09/2022) for Eczema, Rosacea.  Graciella Belton, RMA, am acting as scribe for Forest Gleason, MD .  Documentation: I have reviewed the above documentation  for accuracy and completeness, and I agree with the above.  Forest Gleason, MD

## 2021-06-09 NOTE — Progress Notes (Signed)
Doxycycline RF sent in to pharmacy per patient's request using [place] coupon.

## 2021-06-09 NOTE — Patient Instructions (Addendum)
Rosacea is a chronic progressive skin condition usually affecting the face of adults, causing redness and/or acne bumps. It is treatable but not curable. It sometimes affects the eyes (ocular rosacea) as well. It may respond to topical and/or systemic medication and can flare with stress, sun exposure, alcohol, exercise and some foods.  Daily application of broad spectrum spf 30+ sunscreen to face is recommended to reduce flares.  Continue doxycycline 20 mg twice daily with food.  May continue metronidazole once daily.  Start Rhofade daily in the morning.  Start Soolantra once daily at bedtime.  If not covered, consider Skin Medicinals.   Doxycycline should be taken with food to prevent nausea. Do not lay down for 30 minutes after taking. Be cautious with sun exposure and use good sun protection while on this medication. Pregnant women should not take this medication.   Dupilumab (Dupixent) is a treatment given by injection for adults and children with moderate-to-severe atopic dermatitis. Goal is control of skin condition, not cure. It is given as 2 injections at the first dose followed by 1 injection ever 2 weeks thereafter.  Young children are dosed monthly.  Potential side effects include allergic reaction, herpes infections, injection site reactions and conjunctivitis (inflammation of the eyes).  The use of Dupixent requires long term medication management, including periodic office visits.   If You Need Anything After Your Visit  If you have any questions or concerns for your doctor, please call our main line at [phone] and press option 4 to reach your doctor's medical assistant. If no one answers, please leave a voicemail as directed and we will return your call as soon as possible. Messages left after 4 pm will be answered the following business day.   You may also send [place] a message via [place]. We typically respond to MyChart messages within 1-2 business days.  For prescription  refills, please ask your pharmacy to contact our office. Our fax number is [phone].  If you have an urgent issue when the clinic is closed that cannot wait until the next business day, you can page your doctor at the number below.    Please note that while we do our best to be available for urgent issues outside of office hours, we are not available 24/7.   If you have an urgent issue and are unable to reach [place], you may choose to seek medical care at your doctor's office, retail clinic, urgent care center, or emergency room.  If you have a medical emergency, please immediately call 911 or go to the emergency department.  Pager Numbers  - Dr. Nehemiah Massed: [phone]  - Dr. Laurence Ferrari: [phone]  - Dr. Nicole Kindred: [phone]  In the event of inclement weather, please call our main line at [phone] for an update on the status of any delays or closures.  Dermatology Medication Tips: Please keep the boxes that topical medications come in in order to help keep track of the instructions about where and how to use these. Pharmacies typically print the medication instructions only on the boxes and not directly on the medication tubes.   If your medication is too expensive, please contact our office at [phone] option 4 or send [place] a message through [place].   We are unable to tell what your co-pay for medications will be in advance as this is different depending on your insurance coverage. However, we may be able to find a substitute medication at lower cost or fill out paperwork to get insurance to cover  a needed medication.   If a prior authorization is required to get your medication covered by your insurance company, please allow [place] 1-2 business days to complete this process.  Drug prices often vary depending on where the prescription is filled and some pharmacies may offer cheaper prices.  The website www.goodrx.com contains coupons for medications through different pharmacies. The  prices here do not account for what the cost may be with help from insurance (it may be cheaper with your insurance), but the website can give you the price if you did not use any insurance.  - You can print the associated coupon and take it with your prescription to the pharmacy.  - You may also stop by our office during regular business hours and pick up a GoodRx coupon card.  - If you need your prescription sent electronically to a different pharmacy, notify our office through [facility] or by phone at [phone] option 4.     Si Usted Necesita Algo Despus de Su Visita  Tambin puede enviarnos un mensaje a travs de Pharmacist, community. Por lo general respondemos a los mensajes de MyChart en el transcurso de 1 a 2 das hbiles.  Para renovar recetas, por favor pida a su farmacia que se ponga en contacto con nuestra oficina. Harland Dingwall de fax es [place] [phone].  Si tiene un asunto urgente cuando la clnica est cerrada y que no puede esperar hasta el siguiente da hbil, puede llamar/localizar a su doctor(a) al nmero que aparece a continuacin.   Por favor, tenga en cuenta que aunque hacemos todo lo posible para estar disponibles para asuntos urgentes fuera del horario de [place], no estamos disponibles las 24 horas del da, los 7 das de la [place].   Si tiene un problema urgente y no puede comunicarse con nosotros, puede optar por buscar atencin mdica  en el consultorio de su doctor(a), en una clnica privada, en un centro de atencin urgente o en una sala de emergencias.  Si tiene Engineering geologist, por favor llame inmediatamente al 911 o vaya a la sala de emergencias.  Nmeros de bper  - Dr. Nehemiah Massed: [phone]  - Dra. Moye: [phone]  - Dra. Nicole Kindred: [phone]  En caso de inclemencias del [place], por favor llame a Johnsie Kindred principal al [phone] para una actualizacin sobre el [place] de cualquier retraso o cierre.  Consejos para la medicacin en  dermatologa: Por favor, guarde las cajas en las que vienen los medicamentos de uso tpico para ayudarle a seguir las instrucciones sobre dnde y cmo usarlos. Las farmacias generalmente imprimen las instrucciones del medicamento slo en las cajas y no directamente en los tubos del [place].   Si su medicamento es muy caro, por favor, pngase en contacto con Zigmund Daniel llamando al [phone] y presione la opcin 4 o envenos un mensaje a travs de Pharmacist, community.   No podemos decirle cul ser su copago por los medicamentos por adelantado ya que esto es diferente dependiendo de la cobertura de su seguro. Sin embargo, es posible que podamos encontrar un medicamento sustituto a Electrical engineer un formulario para que el seguro cubra el medicamento que se considera necesario.   Si se requiere una autorizacin previa para que su compaa de seguros [place] su medicamento, por favor permtanos de 1 a 2 das hbiles para completar este proceso.  Los precios de los medicamentos varan con frecuencia dependiendo del Environmental consultant de dnde se surte la receta y alguna farmacias pueden ofrecer precios ms baratos.  El sitio web www.goodrx.com tiene cupones para medicamentos de Airline pilot. Los precios aqu no tienen en cuenta lo que podra costar con la ayuda del seguro (puede ser ms barato con su seguro), pero el sitio web puede darle el precio si no utiliz Research scientist (physical sciences).  - Puede imprimir el cupn correspondiente y llevarlo con su receta a la farmacia.  - Tambin puede pasar por nuestra oficina durante el horario de atencin regular y Charity fundraiser una tarjeta de cupones de GoodRx.  - Si necesita que su receta se enve electrnicamente a una farmacia diferente, informe a nuestra oficina a travs de MyChart de [place] o por telfono llamando al [phone] y presione la opcin 4.

## 2021-06-15 ENCOUNTER — Ambulatory Visit (INDEPENDENT_AMBULATORY_CARE_PROVIDER_SITE_OTHER): Payer: Medicare Other | Admitting: Internal Medicine

## 2021-06-15 ENCOUNTER — Other Ambulatory Visit: Payer: Self-pay

## 2021-06-15 ENCOUNTER — Encounter: Payer: Self-pay | Admitting: Internal Medicine

## 2021-06-15 DIAGNOSIS — J069 Acute upper respiratory infection, unspecified: Secondary | ICD-10-CM | POA: Insufficient documentation

## 2021-06-15 MED ORDER — PREDNISONE 20 MG PO TABS: 40.0000 mg | ORAL_TABLET | Freq: Every day | ORAL | 0 refills | Status: DC

## 2021-06-15 MED ORDER — HYDROCODONE BIT-HOMATROP MBR 5-1.5 MG/5ML PO SOLN: 5.0000 mL | Freq: Every evening | ORAL | 0 refills | Status: DC | PRN

## 2021-06-15 NOTE — Progress Notes (Signed)
Subjective:    Patient ID: Katherine Carpenter, female    DOB: 1961-04-30, 61 y.o.   MRN: [id number]  HPI Here due to cough  Started 4-5 days ago--but worsening Now has to stop what she is doing due paroxysms No sputum No fever Some sore throat---?due to the cough Some SOB/tightness in breath when she moves around----no history of asthma No headache No sinus drainage Some ear pain  Using alka seltzer cold and cough and nyquil No help Trouble sleeping due to cough---can't lie down  COVID test negative --day 2  Current Outpatient Medications on File Prior to Visit  Medication Sig Dispense Refill   ALPRAZolam (XANAX) 1 MG tablet Take 1 mg by mouth 5 (five) times daily. Patient may take up to 5 times a day     amphetamine-dextroamphetamine (ADDERALL XR) 30 MG 24 hr capsule Take 30 mg by mouth every morning.     augmented betamethasone dipropionate (DIPROLENE-AF) 0.05 % cream Apply topically 2 (two) times daily.     buPROPion (WELLBUTRIN XL) 300 MG 24 hr tablet Take 300 mg by mouth every morning.     Calcium Carbonate-Vitamin D 500-125 MG-UNIT TABS Take by mouth.     carbamazepine (TEGRETOL) 200 MG tablet TAKE ONE TABLET BY MOUTH EVERY MORNING AND THREE TABLETS AT BEDTIME     doxycycline (PERIOSTAT) 20 MG tablet Take 1 tablet (20 mg total) by mouth 2 (two) times daily. With food 180 tablet 1   Dupilumab (DUPIXENT) 300 MG/2ML SOPN Inject 300 mg into the skin every 14 (fourteen) days. Starting at day 15 for maintenance. 12 mL 0   fluticasone (FLONASE) 50 MCG/ACT nasal spray Place 1 spray into both nostrils 2 (two) times daily. 16 g 0   Ivermectin (SOOLANTRA) 1 % CREA Apply to face once daily 30 g 2   lamoTRIgine (LAMICTAL) 200 MG tablet Take 100-200 mg by mouth 2 (two) times daily. 1/2 tablet every morning and 1 tablet every evening     metoprolol succinate (TOPROL-XL) 25 MG 24 hr tablet Take 0.5 tablets (12.5 mg total) by mouth daily. For heart rate and blood pressure. 45 tablet 3    Multiple Vitamin (MULTI-VITAMINS) TABS Take by mouth.     olanzapine-FLUoxetine (SYMBYAX) 12-25 MG per capsule Take 1 capsule by mouth every evening.     omeprazole (PRILOSEC) 40 MG capsule Take 40 mg by mouth daily.     ondansetron (ZOFRAN-ODT) 8 MG disintegrating tablet Take 1 tablet (8 mg total) by mouth every 8 (eight) hours as needed for nausea. 20 tablet 0   Oxymetazoline HCl (RHOFADE) 1 % CREA Apply once daily in the morning. 30 g 2   QUEtiapine (SEROQUEL XR) 200 MG 24 hr tablet Take 1,000 mg by mouth at bedtime. Patient takes 5 tablets by mouth every night at bedtime     simvastatin (ZOCOR) 10 MG tablet Take 1 tablet (10 mg total) by mouth daily. For cholesterol. 90 tablet 3   No current facility-administered medications on file prior to visit.    No Known Allergies  Past Medical History:  Diagnosis Date   Depression    Hypertension     Past Surgical History:  Procedure Laterality Date   CERVICAL BIOPSY  W/ LOOP ELECTRODE EXCISION  2016   ESOPHAGOGASTRODUODENOSCOPY (EGD) WITH PROPOFOL N/A 10/27/2019   Procedure: ESOPHAGOGASTRODUODENOSCOPY (EGD) WITH PROPOFOL;  Surgeon: Virgel Manifold, MD;  Location: [facility] ENDOSCOPY;  Service: Endoscopy;  Laterality: N/A;   TUBAL LIGATION      Family  History  Problem Relation Age of Onset   Asthma Mother    Depression Daughter    Arthritis Maternal Grandmother    Cancer Maternal Grandmother        lung   Cancer Paternal Grandmother    Leukemia Paternal Grandmother     Social History   Socioeconomic History   Marital status: Married    Spouse name: Not on file   Number of children: Not on file   Years of education: Not on file   Highest education level: Not on file  Occupational History   Not on file  Tobacco Use   Smoking status: Never   Smokeless tobacco: Never  Substance and Sexual Activity   Alcohol use: No    Comment: Pt denies   Drug use: No    Comment: Pt denies   Sexual activity: Not on file  Other Topics  Concern   Not on file  Social History Narrative   Not on file   Social Determinants of Health   Financial Resource Strain: Low Risk    Difficulty of Paying Living Expenses: Not hard at all  Food Insecurity: No Food Insecurity   Worried About Charity fundraiser in the Last Year: Never true   [place] in the Last Year: Never true  Transportation Needs: No Transportation Needs   Lack of Transportation (Medical): No   Lack of Transportation (Non-Medical): No  Physical Activity: Inactive   Days of Exercise per Week: 0 days   Minutes of Exercise per Session: 0 min  Stress: No Stress Concern Present   Feeling of Stress : Not at all  Social Connections: Not on file  Intimate Partner Violence: Not At Risk   Fear of Current or Ex-Partner: No   Emotionally Abused: No   Physically Abused: No   Sexually Abused: No   Review of Systems No N/V Eating fair---appetite is off some No diarrhea     Objective:   Physical Exam Constitutional:      Appearance: Normal appearance.     Comments: Spasmatic cough--dry  HENT:     Right Ear: Tympanic membrane and ear canal normal.     Left Ear: Tympanic membrane and ear canal normal.     Nose:     Comments: Moderate congestion    Mouth/Throat:     Pharynx: No oropharyngeal exudate or posterior oropharyngeal erythema.  Pulmonary:     Effort: Pulmonary effort is normal.     Breath sounds: Normal breath sounds. No wheezing or rales.     Comments: Not tight or wheezy RR --14--16 Musculoskeletal:     Cervical back: Neck supple.  Lymphadenopathy:     Cervical: No cervical adenopathy.  Neurological:     Mental Status: She is alert.           Assessment & Plan:

## 2021-06-15 NOTE — Assessment & Plan Note (Addendum)
Seems to be laryngeal spasm or bronchial--like croup Will give several days of prednisone Cough syrup for bedtime DM OTC meds during the day  Recheck/consider CXR if sig respiratory trouble

## 2021-06-17 ENCOUNTER — Telehealth: Payer: Medicare Other | Admitting: Primary Care

## 2021-06-19 ENCOUNTER — Other Ambulatory Visit: Payer: Self-pay | Admitting: Primary Care

## 2021-06-19 DIAGNOSIS — K219 Gastro-esophageal reflux disease without esophagitis: Secondary | ICD-10-CM

## 2021-06-19 MED ORDER — OMEPRAZOLE 40 MG PO CPDR: 40.0000 mg | DELAYED_RELEASE_CAPSULE | Freq: Every day | ORAL | 0 refills | Status: AC

## 2021-06-23 ENCOUNTER — Encounter: Payer: Self-pay | Admitting: Primary Care

## 2021-06-23 ENCOUNTER — Telehealth (INDEPENDENT_AMBULATORY_CARE_PROVIDER_SITE_OTHER): Payer: Medicare Other | Admitting: Primary Care

## 2021-06-23 ENCOUNTER — Other Ambulatory Visit: Payer: Self-pay

## 2021-06-23 VITALS — Ht 63.0 in | Wt 198.0 lb

## 2021-06-23 DIAGNOSIS — J069 Acute upper respiratory infection, unspecified: Secondary | ICD-10-CM | POA: Diagnosis not present

## 2021-06-23 MED ORDER — AZITHROMYCIN 250 MG PO TABS: ORAL_TABLET | ORAL | 0 refills | Status: DC

## 2021-06-23 NOTE — Progress Notes (Signed)
Patient ID: Katherine Carpenter, female    DOB: 04-17-1961, 61 y.o.   MRN: [id number]  Virtual visit completed through [place], a video enabled telemedicine application. Due to national recommendations of social distancing due to COVID-19, a virtual visit is felt to be most appropriate for this patient at this time. Reviewed limitations, risks, security and privacy concerns of performing a virtual visit and the availability of in person appointments. I also reviewed that there may be a patient responsible charge related to this service. The patient agreed to proceed.   Patient location: home Provider location: [place] at [facility], office Persons participating in this virtual visit: patient, provider   If any vitals were documented, they were collected by patient at home unless specified below.    Ht 5\' 3"  (1.6 m)    Wt 198 lb (89.8 kg)    LMP 09/08/2011    BMI 35.07 kg/m    CC: Cough Subjective:   HPI: Katherine Carpenter is a 61 y.o. female with a history of hypertension, GERD, chronic fatigue presenting on 06/23/2021 for cough.  Evaluated by Dr. Silvio Pate on 06/15/21 for a 4-5 day history of cough, worsening that day. Also with sore throat, shortness of breath, chest tightness, ear pain. Negative Covid-19 test on day two of symptoms. Diagnosed with viral URI with cough, treated with prednisone, Hycodan cough syrup.  Today she endorses feeling some better, but still feels "washed out", also feeling very achy. She continues to cough which is about the same. Breathing much better. She completing the prednisone. She's also been taking Alka Seltzer Cough.   She's concerned as her symptoms began nearly 2 weeks ago and have not significantly improved.       Relevant past medical, surgical, family and social history reviewed and updated as indicated. Interim medical history since our last visit reviewed. Allergies and medications reviewed and updated. Outpatient Medications Prior to Visit   Medication Sig Dispense Refill   ALPRAZolam (XANAX) 1 MG tablet Take 1 mg by mouth 5 (five) times daily. Patient may take up to 5 times a day     amphetamine-dextroamphetamine (ADDERALL XR) 30 MG 24 hr capsule Take 30 mg by mouth every morning.     augmented betamethasone dipropionate (DIPROLENE-AF) 0.05 % cream Apply topically 2 (two) times daily.     buPROPion (WELLBUTRIN XL) 300 MG 24 hr tablet Take 300 mg by mouth every morning.     Calcium Carbonate-Vitamin D 500-125 MG-UNIT TABS Take by mouth.     carbamazepine (TEGRETOL) 200 MG tablet TAKE ONE TABLET BY MOUTH EVERY MORNING AND THREE TABLETS AT BEDTIME     docusate sodium (COLACE) 100 MG capsule 1 capsule as needed     doxycycline (PERIOSTAT) 20 MG tablet Take 1 tablet (20 mg total) by mouth 2 (two) times daily. With food 180 tablet 1   Dupilumab (DUPIXENT) 300 MG/2ML SOPN Inject 300 mg into the skin every 14 (fourteen) days. Starting at day 15 for maintenance. 12 mL 0   fluticasone (FLONASE) 50 MCG/ACT nasal spray Place 1 spray into both nostrils 2 (two) times daily. 16 g 0   hydrochlorothiazide (HYDRODIURIL) 25 MG tablet      Ivermectin (SOOLANTRA) 1 % CREA Apply to face once daily 30 g 2   lamoTRIgine (LAMICTAL) 200 MG tablet Take 100-200 mg by mouth 2 (two) times daily. 1/2 tablet every morning and 1 tablet every evening     metoprolol succinate (TOPROL-XL) 25 MG 24 hr tablet Take  0.5 tablets (12.5 mg total) by mouth daily. For heart rate and blood pressure. 45 tablet 3   Multiple Vitamin (MULTI-VITAMINS) TABS Take by mouth.     OLANZapine (ZYPREXA) 10 MG tablet Take 10 mg by mouth at bedtime.     olanzapine-FLUoxetine (SYMBYAX) 12-25 MG per capsule Take 1 capsule by mouth every evening.     omeprazole (PRILOSEC) 40 MG capsule Take 1 capsule (40 mg total) by mouth daily. For heartburn. Office visit required for further refills. 90 capsule 0   ondansetron (ZOFRAN-ODT) 8 MG disintegrating tablet Take 1 tablet (8 mg total) by mouth every  8 (eight) hours as needed for nausea. 20 tablet 0   Oxymetazoline HCl (RHOFADE) 1 % CREA Apply once daily in the morning. 30 g 2   QUEtiapine (SEROQUEL XR) 200 MG 24 hr tablet Take 1,000 mg by mouth at bedtime. Patient takes 5 tablets by mouth every night at bedtime     simvastatin (ZOCOR) 10 MG tablet Take 1 tablet (10 mg total) by mouth daily. For cholesterol. 90 tablet 3   HYDROcodone bit-homatropine (HYCODAN) 5-1.5 MG/5ML syrup Take 5 mLs by mouth at bedtime as needed for cough. (Patient not taking: Reported on 06/23/2021) 120 mL 0   lamoTRIgine (LAMICTAL) 200 MG tablet 1 tablet     predniSONE (DELTASONE) 20 MG tablet Take 2 tablets (40 mg total) by mouth daily. For 3 days, then 1 tab daily for 3 days (Patient not taking: Reported on 06/23/2021) 9 tablet 0   No facility-administered medications prior to visit.     Per HPI unless specifically indicated in ROS section below Review of Systems  Constitutional:  Positive for fatigue.  Respiratory:  Positive for cough.   Objective:  Ht 5\' 3"  (1.6 m)    Wt 198 lb (89.8 kg)    LMP 09/08/2011    BMI 35.07 kg/m   Wt Readings from Last 3 Encounters:  06/23/21 198 lb (89.8 kg)  06/15/21 203 lb 8 oz (92.3 kg)  06/02/21 201 lb (91.2 kg)       Physical exam: General: Alert and oriented x 3, no distress, does not appear sickly  Pulmonary: Speaks in complete sentences without increased work of breathing, no cough during visit.  Psychiatric: Normal mood, thought content, and behavior.     Results for orders placed or performed in visit on 06/08/21  Hemoglobin A1c  Result Value Ref Range   Hgb A1c MFr Bld 6.1 4.6 - 6.5 %   Assessment & Plan:   Problem List Items Addressed This Visit       Respiratory   Viral URI with cough - Primary    Still likely viral, but given her duration of symptoms without much improvement we will proceed with antibiotic treatment.  Fortunately SOB has improved so do not feel xray is warranted at this time. No  fevers. She agrees.  Rx for azithromycin 250 mg tablets sent to pharmacy for 5 day course.  Continue Alka-Seltzer as needed. Follow up as needed.      Relevant Medications   azithromycin (ZITHROMAX) 250 MG tablet     Meds ordered this encounter  Medications   azithromycin (ZITHROMAX) 250 MG tablet    Sig: Take 2 tablets by mouth today, then 1 tablet daily for 4 additional days.    Dispense:  6 tablet    Refill:  0    Order Specific Question:   Supervising Provider    Answer:   BEDSOLE, AMY E [[id number]]  No orders of the defined types were placed in this encounter.   I discussed the assessment and treatment plan with the patient. The patient was provided an opportunity to ask questions and all were answered. The patient agreed with the plan and demonstrated an understanding of the instructions. The patient was advised to call back or seek an in-person evaluation if the symptoms worsen or if the condition fails to improve as anticipated.  Follow up plan:  Start Azithromycin antibiotics for infection. Take 2 tablets by mouth today, then 1 tablet daily for 4 additional days.  Be sure to rest and drink plenty of fluids.  It was a pleasure to see you today!   Pleas Koch, NP

## 2021-06-23 NOTE — Assessment & Plan Note (Signed)
Still likely viral, but given her duration of symptoms without much improvement we will proceed with antibiotic treatment.  Fortunately SOB has improved so do not feel xray is warranted at this time. No fevers. She agrees.  Rx for azithromycin 250 mg tablets sent to pharmacy for 5 day course.  Continue Alka-Seltzer as needed. Follow up as needed.

## 2021-06-23 NOTE — Patient Instructions (Signed)
Start Azithromycin antibiotics for infection. Take 2 tablets by mouth today, then 1 tablet daily for 4 additional days.  Be sure to rest and drink plenty of fluids.  It was a pleasure to see you today!

## 2021-06-28 ENCOUNTER — Other Ambulatory Visit: Payer: Self-pay | Admitting: Primary Care

## 2021-06-28 DIAGNOSIS — K219 Gastro-esophageal reflux disease without esophagitis: Secondary | ICD-10-CM

## 2021-07-01 DIAGNOSIS — F3174 Bipolar disorder, in full remission, most recent episode manic: Secondary | ICD-10-CM | POA: Diagnosis not present

## 2021-07-01 DIAGNOSIS — F900 Attention-deficit hyperactivity disorder, predominantly inattentive type: Secondary | ICD-10-CM | POA: Diagnosis not present

## 2021-07-01 DIAGNOSIS — F410 Panic disorder [episodic paroxysmal anxiety] without agoraphobia: Secondary | ICD-10-CM | POA: Diagnosis not present

## 2021-07-01 DIAGNOSIS — F3176 Bipolar disorder, in full remission, most recent episode depressed: Secondary | ICD-10-CM | POA: Diagnosis not present

## 2021-07-05 ENCOUNTER — Other Ambulatory Visit: Payer: Self-pay | Admitting: Primary Care

## 2021-07-05 DIAGNOSIS — K219 Gastro-esophageal reflux disease without esophagitis: Secondary | ICD-10-CM

## 2021-07-14 ENCOUNTER — Other Ambulatory Visit: Payer: Self-pay

## 2021-07-14 DIAGNOSIS — L309 Dermatitis, unspecified: Secondary | ICD-10-CM

## 2021-07-14 MED ORDER — DUPIXENT 300 MG/2ML ~~LOC~~ SOAJ: 300.0000 mg | SUBCUTANEOUS | 1 refills | Status: DC

## 2021-07-14 NOTE — Progress Notes (Signed)
Patient requested pharmacy change.

## 2021-07-19 ENCOUNTER — Ambulatory Visit: Payer: Medicare Other | Admitting: Primary Care

## 2021-07-20 DIAGNOSIS — U071 COVID-19: Secondary | ICD-10-CM | POA: Diagnosis not present

## 2021-07-22 ENCOUNTER — Encounter: Payer: Self-pay | Admitting: Primary Care

## 2021-07-22 ENCOUNTER — Other Ambulatory Visit: Payer: Self-pay

## 2021-07-22 ENCOUNTER — Other Ambulatory Visit (HOSPITAL_COMMUNITY)
Admission: RE | Admit: 2021-07-22 | Discharge: 2021-07-22 | Disposition: A | Discharge status: Home / Self Care | Payer: Medicare Other | Source: Ambulatory Visit | Attending: Primary Care | Admitting: Primary Care

## 2021-07-22 ENCOUNTER — Ambulatory Visit (INDEPENDENT_AMBULATORY_CARE_PROVIDER_SITE_OTHER): Payer: Medicare Other | Admitting: Primary Care

## 2021-07-22 VITALS — BP 120/68 | HR 89 | Temp 98.7°F | Ht 63.0 in | Wt 197.0 lb

## 2021-07-22 DIAGNOSIS — K219 Gastro-esophageal reflux disease without esophagitis: Secondary | ICD-10-CM

## 2021-07-22 DIAGNOSIS — Z124 Encounter for screening for malignant neoplasm of cervix: Secondary | ICD-10-CM | POA: Diagnosis not present

## 2021-07-22 DIAGNOSIS — Z114 Encounter for screening for human immunodeficiency virus [HIV]: Secondary | ICD-10-CM

## 2021-07-22 DIAGNOSIS — E785 Hyperlipidemia, unspecified: Secondary | ICD-10-CM | POA: Diagnosis not present

## 2021-07-22 DIAGNOSIS — I10 Essential (primary) hypertension: Secondary | ICD-10-CM

## 2021-07-22 DIAGNOSIS — Z1151 Encounter for screening for human papillomavirus (HPV): Secondary | ICD-10-CM | POA: Diagnosis not present

## 2021-07-22 DIAGNOSIS — R7303 Prediabetes: Secondary | ICD-10-CM | POA: Diagnosis not present

## 2021-07-22 DIAGNOSIS — L309 Dermatitis, unspecified: Secondary | ICD-10-CM | POA: Diagnosis not present

## 2021-07-22 DIAGNOSIS — Z1231 Encounter for screening mammogram for malignant neoplasm of breast: Secondary | ICD-10-CM

## 2021-07-22 DIAGNOSIS — L719 Rosacea, unspecified: Secondary | ICD-10-CM | POA: Diagnosis not present

## 2021-07-22 DIAGNOSIS — Z01419 Encounter for gynecological examination (general) (routine) without abnormal findings: Secondary | ICD-10-CM | POA: Diagnosis not present

## 2021-07-22 DIAGNOSIS — E559 Vitamin D deficiency, unspecified: Secondary | ICD-10-CM | POA: Diagnosis not present

## 2021-07-22 DIAGNOSIS — E538 Deficiency of other specified B group vitamins: Secondary | ICD-10-CM

## 2021-07-22 DIAGNOSIS — F3178 Bipolar disorder, in full remission, most recent episode mixed: Secondary | ICD-10-CM

## 2021-07-22 LAB — VITAMIN D 25 HYDROXY (VIT D DEFICIENCY, FRACTURES): VITD: 28.37 ng/mL — ABNORMAL LOW (ref 30.00–100.00)

## 2021-07-22 LAB — LIPID PANEL
Cholesterol: 173 mg/dL (ref 0–200)
HDL: 46.5 mg/dL (ref >39.00)
LDL Cholesterol: 90 mg/dL (ref 0–99)
NonHDL: 126.64
Total CHOL/HDL Ratio: 4
Triglycerides: 185 mg/dL — ABNORMAL HIGH (ref 0.0–149.0)
VLDL: 37 mg/dL (ref 0.0–40.0)

## 2021-07-22 LAB — CBC
HCT: 39.9 % (ref 36.0–46.0)
Hemoglobin: 13 g/dL (ref 12.0–15.0)
MCHC: 32.5 g/dL (ref 30.0–36.0)
MCV: 87.3 fl (ref 78.0–100.0)
Platelets: 246 10*3/uL (ref 150.0–400.0)
RBC: 4.57 Mil/uL (ref 3.87–5.11)
RDW: 13.7 % (ref 11.5–15.5)
WBC: 7.7 10*3/uL (ref 4.0–10.5)

## 2021-07-22 LAB — COMPREHENSIVE METABOLIC PANEL
ALT: 15 U/L (ref 0–35)
AST: 12 U/L (ref 0–37)
Albumin: 3.9 g/dL (ref 3.5–5.2)
Alkaline Phosphatase: 101 U/L (ref 39–117)
BUN: 24 mg/dL — ABNORMAL HIGH (ref 6–23)
CO2: 35 mEq/L — ABNORMAL HIGH (ref 19–32)
Calcium: 9.5 mg/dL (ref 8.4–10.5)
Chloride: 100 mEq/L (ref 96–112)
Creatinine, Ser: 0.92 mg/dL (ref 0.40–1.20)
GFR: 67.65 mL/min (ref >60.00)
Glucose, Bld: 75 mg/dL (ref 70–99)
Potassium: 4.4 mEq/L (ref 3.5–5.1)
Sodium: 140 mEq/L (ref 135–145)
Total Bilirubin: 0.2 mg/dL (ref 0.2–1.2)
Total Protein: 5.9 g/dL — ABNORMAL LOW (ref 6.0–8.3)

## 2021-07-22 LAB — HEMOGLOBIN A1C: Hgb A1c MFr Bld: 6 % (ref 4.6–6.5)

## 2021-07-22 LAB — VITAMIN B12: Vitamin B-12: 412 pg/mL (ref 211–911)

## 2021-07-22 LAB — FOLATE: Folate: 18 ng/mL (ref >5.9)

## 2021-07-22 LAB — TSH: TSH: 1.75 u[IU]/mL (ref 0.35–5.50)

## 2021-07-22 NOTE — Assessment & Plan Note (Signed)
Pap smear due and completed today.

## 2021-07-22 NOTE — Assessment & Plan Note (Signed)
Repeat lipid panel pending. Continue simvastatin 10 mg daily. 

## 2021-07-22 NOTE — Patient Instructions (Signed)
Stop by the lab prior to leaving today. I will notify you of your results once received.   You are due to repeat your Cologuard for colon cancer screening in June 2023. Notify me around May so that I can place the order.  Call the [facility] to schedule your mammogram.   It was a pleasure to see you today!

## 2021-07-22 NOTE — Assessment & Plan Note (Signed)
Controlled.  Continue omeprazole 40 mg daily. We discussed that she could try a dose reduction to 20 mg OTC, especially given her weight loss.   She may try and update.

## 2021-07-22 NOTE — Progress Notes (Signed)
Subjective:    Patient ID: Katherine Carpenter, female    DOB: 12-06-1960, 61 y.o.   MRN: [id number]  HPI  Katherine Carpenter is a very pleasant 61 y.o. female with a history of hypertension, GERD, hyperlipidemia, bipolar disorder, fatigue who presents today for follow up of chronic conditions.  Following with psychiatry, managed on Adderall XR 30 mg daily, Wellbutrin XL 300 mg daily, Tegretol 200 mg in AM and 600 mg HS, Prozac 40 mg daily, Lamictal 200 mg daily, Symbyax 12-25 mg daily, alprazolam 1 mg TID.   Following with dermatology for Rosacea, doing well on Doxycycline 20 mg BID, metronidazole gel 0.75%, Soolantra 1% cream. Feels well managed. Also compliant to Dupixent every 14 days for Eczema.   Managed on HCTZ 25 mg daily and metoprolol succinate 25 mg daily for hypertension. She denies chest pain, dizziness, palpitations. She's intentionally lost 24 pounds through calorie counting. She's involved in a weight loss program through her insurance.   Immunizations: -Tetanus: 2014 -Influenza: Completed this season  -Covid-19: 3 vaccines -Shingles: Completed Shingrix  Pap Smear: Completed in February 2020, due today.  Mammogram: Completed in March 2022 Colonoscopy: Completed in 2013, due June 2023, she opts for Cologuard   BP Readings from Last 3 Encounters:  07/22/21 120/68  06/15/21 120/78  12/23/20 (!) 147/73    Wt Readings from Last 3 Encounters:  07/22/21 197 lb (89.4 kg)  06/23/21 198 lb (89.8 kg)  06/15/21 203 lb 8 oz (92.3 kg)        Review of Systems  Eyes:  Negative for visual disturbance.  Respiratory:  Negative for shortness of breath.   Cardiovascular:  Negative for chest pain.  Gastrointestinal:  Negative for constipation and diarrhea.  Neurological:  Negative for dizziness and headaches.  Psychiatric/Behavioral:  The patient is not nervous/anxious.         Past Medical History:  Diagnosis Date   Depression    Hypertension     Social History    Socioeconomic History   Marital status: Married    Spouse name: Not on file   Number of children: Not on file   Years of education: Not on file   Highest education level: Not on file  Occupational History   Not on file  Tobacco Use   Smoking status: Never   Smokeless tobacco: Never  Substance and Sexual Activity   Alcohol use: No    Comment: Pt denies   Drug use: No    Comment: Pt denies   Sexual activity: Not on file  Other Topics Concern   Not on file  Social History Narrative   Not on file   Social Determinants of Health   Financial Resource Strain: Not on file  Food Insecurity: Not on file  Transportation Needs: Not on file  Physical Activity: Not on file  Stress: Not on file  Social Connections: Not on file  Intimate Partner Violence: Not on file    Past Surgical History:  Procedure Laterality Date   CERVICAL BIOPSY  W/ LOOP ELECTRODE EXCISION  2016   ESOPHAGOGASTRODUODENOSCOPY (EGD) WITH PROPOFOL N/A 10/27/2019   Procedure: ESOPHAGOGASTRODUODENOSCOPY (EGD) WITH PROPOFOL;  Surgeon: Virgel Manifold, MD;  Location: [facility] ENDOSCOPY;  Service: Endoscopy;  Laterality: N/A;   TUBAL LIGATION      Family History  Problem Relation Age of Onset   Asthma Mother    Depression Daughter    Arthritis Maternal Grandmother    Cancer Maternal Grandmother  lung   Cancer Paternal Grandmother    Leukemia Paternal Grandmother     Allergies  Allergen Reactions   Codeine Other (See Comments)    Current Outpatient Medications on File Prior to Visit  Medication Sig Dispense Refill   ALPRAZolam (XANAX) 1 MG tablet Take 1 mg by mouth 5 (five) times daily. Patient may take up to 5 times a day     amphetamine-dextroamphetamine (ADDERALL XR) 30 MG 24 hr capsule Take 30 mg by mouth every morning.     augmented betamethasone dipropionate (DIPROLENE-AF) 0.05 % cream Apply topically 2 (two) times daily.     buPROPion (WELLBUTRIN XL) 300 MG 24 hr tablet Take 300 mg by  mouth every morning.     Calcium Carbonate-Vitamin D 500-125 MG-UNIT TABS Take by mouth.     carbamazepine (TEGRETOL) 200 MG tablet TAKE ONE TABLET BY MOUTH EVERY MORNING AND THREE TABLETS AT BEDTIME     docusate sodium (COLACE) 100 MG capsule 1 capsule as needed     doxycycline (PERIOSTAT) 20 MG tablet Take 1 tablet (20 mg total) by mouth 2 (two) times daily. With food 180 tablet 1   Dupilumab (DUPIXENT) 300 MG/2ML SOPN Inject 300 mg into the skin every 14 (fourteen) days. Starting at day 15 for maintenance. 12 mL 1   fluticasone (FLONASE) 50 MCG/ACT nasal spray Place 1 spray into both nostrils 2 (two) times daily. 16 g 0   hydrochlorothiazide (HYDRODIURIL) 25 MG tablet      Ivermectin (SOOLANTRA) 1 % CREA Apply to face once daily 30 g 2   lamoTRIgine (LAMICTAL) 200 MG tablet 1 tablet     metoprolol succinate (TOPROL-XL) 25 MG 24 hr tablet Take 0.5 tablets (12.5 mg total) by mouth daily. For heart rate and blood pressure. 45 tablet 3   Multiple Vitamin (MULTI-VITAMINS) TABS Take by mouth.     OLANZapine (ZYPREXA) 10 MG tablet Take 10 mg by mouth at bedtime.     olanzapine-FLUoxetine (SYMBYAX) 12-25 MG per capsule Take 1 capsule by mouth every evening.     omeprazole (PRILOSEC) 40 MG capsule Take 1 capsule (40 mg total) by mouth daily. For heartburn. Office visit required for further refills. 90 capsule 0   ondansetron (ZOFRAN-ODT) 8 MG disintegrating tablet Take 1 tablet (8 mg total) by mouth every 8 (eight) hours as needed for nausea. 20 tablet 0   Oxymetazoline HCl (RHOFADE) 1 % CREA Apply once daily in the morning. 30 g 2   simvastatin (ZOCOR) 10 MG tablet Take 1 tablet (10 mg total) by mouth daily. For cholesterol. 90 tablet 3   FLUoxetine (PROZAC) 40 MG capsule Take 40 mg by mouth at bedtime.     metroNIDAZOLE (METROGEL) 0.75 % gel Apply topically.     No current facility-administered medications on file prior to visit.    BP 120/68    Pulse 89    Temp 98.7 F (37.1 C) (Temporal)    Ht  5\' 3"  (1.6 m)    Wt 197 lb (89.4 kg)    LMP 09/08/2011    SpO2 96%    BMI 34.90 kg/m  Objective:   Physical Exam Cardiovascular:     Rate and Rhythm: Normal rate and regular rhythm.  Pulmonary:     Effort: Pulmonary effort is normal.     Breath sounds: Normal breath sounds.  Abdominal:     Palpations: Abdomen is soft.     Tenderness: There is no abdominal tenderness.  Musculoskeletal:     Cervical  back: Neck supple.  Skin:    General: Skin is warm and dry.  Psychiatric:        Mood and Affect: Mood normal.          Assessment & Plan:      This visit occurred during the SARS-CoV-2 public health emergency.  Safety protocols were in place, including screening questions prior to the visit, additional usage of staff PPE, and extensive cleaning of exam room while observing appropriate contact time as indicated for disinfecting solutions.

## 2021-07-22 NOTE — Assessment & Plan Note (Signed)
Controlled in the office today.  Continue HCTZ 25 mg daily, metoprolol succinate 25 mg daily.  CMP pending.

## 2021-07-22 NOTE — Assessment & Plan Note (Signed)
Controlled.  Following with dermatology. Last office notes reviewed.  Continue metronidazole 0.75% gel daily, doxycycline 20 mg BID, Rhofade 1% cream daily, Soolantra 1% cream.

## 2021-07-22 NOTE — Assessment & Plan Note (Signed)
Controlled per patient.  Continue fluoxetine 40 mg daily, Wellbutrin XL 300 mg daily, Tegretol 200 mg in AM and 600 mg in PM, Xanax 1 mg TID, Lamictal 200 mg daily, Symbyax 12-25 mg daily.   Following with psychiatry. Labs pending.

## 2021-07-22 NOTE — Assessment & Plan Note (Signed)
Following with dermatology.  Continue Dupixent 300 mg every 2 weeks.   Last office notes reviewed from December 2022.

## 2021-07-22 NOTE — Assessment & Plan Note (Addendum)
Repeat A1C pending.  Commended her on weight loss, encouraged to continue!

## 2021-07-24 ENCOUNTER — Other Ambulatory Visit: Payer: Self-pay | Admitting: Primary Care

## 2021-07-24 DIAGNOSIS — I10 Essential (primary) hypertension: Secondary | ICD-10-CM

## 2021-07-25 LAB — HIV ANTIBODY (ROUTINE TESTING W REFLEX): HIV 1&2 Ab, 4th Generation: NONREACTIVE

## 2021-07-25 LAB — CYTOLOGY - PAP
Adequacy: ABSENT
Comment: NEGATIVE
Diagnosis: NEGATIVE
High risk HPV: NEGATIVE

## 2021-07-27 ENCOUNTER — Other Ambulatory Visit: Payer: Self-pay

## 2021-07-27 DIAGNOSIS — L309 Dermatitis, unspecified: Secondary | ICD-10-CM

## 2021-07-27 MED ORDER — DUPIXENT 300 MG/2ML ~~LOC~~ SOAJ: 300.0000 mg | SUBCUTANEOUS | 1 refills | Status: DC

## 2021-07-27 NOTE — Progress Notes (Signed)
RFs for Dupixent to [facility]

## 2021-08-08 NOTE — Telephone Encounter (Signed)
I have sent message to let patient know you are out of the office and will respond next week.

## 2021-08-15 ENCOUNTER — Other Ambulatory Visit: Payer: Self-pay | Admitting: Primary Care

## 2021-08-15 DIAGNOSIS — G4733 Obstructive sleep apnea (adult) (pediatric): Secondary | ICD-10-CM | POA: Diagnosis not present

## 2021-08-15 DIAGNOSIS — E785 Hyperlipidemia, unspecified: Secondary | ICD-10-CM

## 2021-09-08 ENCOUNTER — Other Ambulatory Visit: Payer: Self-pay

## 2021-09-08 DIAGNOSIS — L719 Rosacea, unspecified: Secondary | ICD-10-CM

## 2021-09-08 MED ORDER — DOXYCYCLINE HYCLATE 20 MG PO TABS: 20.0000 mg | ORAL_TABLET | Freq: Two times a day (BID) | ORAL | 8 refills | Status: AC

## 2021-10-11 ENCOUNTER — Ambulatory Visit (INDEPENDENT_AMBULATORY_CARE_PROVIDER_SITE_OTHER): Payer: Medicare Other | Admitting: *Deleted

## 2021-10-11 DIAGNOSIS — Z0000 Encounter for general adult medical examination without abnormal findings: Secondary | ICD-10-CM

## 2021-10-11 NOTE — Patient Instructions (Signed)
Katherine Carpenter , ?Thank you for taking time to come for your Medicare Wellness Visit. I appreciate your ongoing commitment to your health goals. Please review the following plan we discussed and let me know if I can assist you in the future.  ? ?Screening recommendations/referrals: ?Colonoscopy: Education provided  ?Mammogram: Education provided ? ?Recommended yearly ophthalmology/optometry visit for glaucoma screening and checkup ?Recommended yearly dental visit for hygiene and checkup ? ?Vaccinations: ?Influenza vaccine: up to date ? ?Tdap vaccine: up to date ?Shingles vaccine: up to date   ? ?Advanced directives: Education provided ? ?Conditions/risks identified:  ? ?Next appointment: 07-25-2021 @ 8:20 Katherine Carpenter ? ? ?Preventive Care 40-64 Older, Female ?Preventive care refers to lifestyle choices and visits with your health care provider that can promote health and wellness. ?What does preventive care include? ?A yearly physical exam. This is also called an annual well check. ?Dental exams once or twice a year. ?Routine eye exams. Ask your health care provider how often you should have your eyes checked. ?Personal lifestyle choices, including: ?Daily care of your teeth and gums. ?Regular physical activity. ?Eating a healthy diet. ?Avoiding tobacco and drug use. ?Limiting alcohol use. ?Practicing safe sex. ?Taking low-dose aspirin every day. ?Taking vitamin and mineral supplements as recommended by your health care provider. ?What happens during an annual well check? ?The services and screenings done by your health care provider during your annual well check will depend on your age, overall health, lifestyle risk factors, and family history of disease. ?Counseling  ?Your health care provider may ask you questions about your: ?Alcohol use. ?Tobacco use. ?Drug use. ?Emotional well-being. ?Home and relationship well-being. ?Sexual activity. ?Eating habits. ?History of falls. ?Memory and ability to understand  (cognition). ?Work and work Statistician. ?Reproductive health. ?Screening  ?You may have the following tests or measurements: ?Height, weight, and BMI. ?Blood pressure. ?Lipid and cholesterol levels. These may be checked every 5 years, or more frequently if you are over 28 years old. ?Skin check. ?Lung cancer screening. You may have this screening every year starting at age 73 if you have a 30-pack-year history of smoking and currently smoke or have quit within the past 15 years. ?Fecal occult blood test (FOBT) of the stool. You may have this test every year starting at age 72. ?Flexible sigmoidoscopy or colonoscopy. You may have a sigmoidoscopy every 5 years or a colonoscopy every 10 years starting at age 55. ?Hepatitis C blood test. ?Hepatitis B blood test. ?Sexually transmitted disease (STD) testing. ?Diabetes screening. This is done by checking your blood sugar (glucose) after you have not eaten for a while (fasting). You may have this done every 1-3 years. ?Bone density scan. This is done to screen for osteoporosis. You may have this done starting at age 76. ?Mammogram. This may be done every 1-2 years. Talk to your health care provider about how often you should have regular mammograms. ?Talk with your health care provider about your test results, treatment options, and if necessary, the need for more tests. ?Vaccines  ?Your health care provider may recommend certain vaccines, such as: ?Influenza vaccine. This is recommended every year. ?Tetanus, diphtheria, and acellular pertussis (Tdap, Td) vaccine. You may need a Td booster every 10 years. ?Zoster vaccine. You may need this after age 68. ?Pneumococcal 13-valent conjugate (PCV13) vaccine. One dose is recommended after age 19. ?Pneumococcal polysaccharide (PPSV23) vaccine. One dose is recommended after age 61. ?Talk to your health care provider about which screenings and vaccines you need and how often  you need them. ?This information is not intended to  replace advice given to you by your health care provider. Make sure you discuss any questions you have with your health care provider. ?Document Released: 06/25/2015 Document Revised: 02/16/2016 Document Reviewed: 03/30/2015 ?Elsevier Interactive Patient Education ? 2017 [employer]. ? ?Fall Prevention in the Home ?Falls can cause injuries. They can happen to people of all ages. There are many things you can do to make your home safe and to help prevent falls. ?What can I do on the outside of my home? ?Regularly fix the edges of walkways and driveways and fix any cracks. ?Remove anything that might make you trip as you walk through a door, such as a raised step or threshold. ?Trim any bushes or trees on the path to your home. ?Use bright outdoor lighting. ?Clear any walking paths of anything that might make someone trip, such as rocks or tools. ?Regularly check to see if handrails are loose or broken. Make sure that both sides of any steps have handrails. ?Any raised decks and porches should have guardrails on the edges. ?Have any leaves, snow, or ice cleared regularly. ?Use sand or salt on walking paths during winter. ?Clean up any spills in your garage right away. This includes oil or grease spills. ?What can I do in the bathroom? ?Use night lights. ?Install grab bars by the toilet and in the tub and shower. Do not use towel bars as grab bars. ?Use non-skid mats or decals in the tub or shower. ?If you need to sit down in the shower, use a plastic, non-slip stool. ?Keep the floor dry. Clean up any water that spills on the floor as soon as it happens. ?Remove soap buildup in the tub or shower regularly. ?Attach bath mats securely with double-sided non-slip rug tape. ?Do not have throw rugs and other things on the floor that can make you trip. ?What can I do in the bedroom? ?Use night lights. ?Make sure that you have a light by your bed that is easy to reach. ?Do not use any sheets or blankets that are too big for  your bed. They should not hang down onto the floor. ?Have a firm chair that has side arms. You can use this for support while you get dressed. ?Do not have throw rugs and other things on the floor that can make you trip. ?What can I do in the kitchen? ?Clean up any spills right away. ?Avoid walking on wet floors. ?Keep items that you use a lot in easy-to-reach places. ?If you need to reach something above you, use a strong step stool that has a grab bar. ?Keep electrical cords out of the way. ?Do not use floor polish or wax that makes floors slippery. If you must use wax, use non-skid floor wax. ?Do not have throw rugs and other things on the floor that can make you trip. ?What can I do with my stairs? ?Do not leave any items on the stairs. ?Make sure that there are handrails on both sides of the stairs and use them. Fix handrails that are broken or loose. Make sure that handrails are as long as the stairways. ?Check any carpeting to make sure that it is firmly attached to the stairs. Fix any carpet that is loose or worn. ?Avoid having throw rugs at the top or bottom of the stairs. If you do have throw rugs, attach them to the floor with carpet tape. ?Make sure that you have a light  switch at the top of the stairs and the bottom of the stairs. If you do not have them, ask someone to add them for you. ?What else can I do to help prevent falls? ?Wear shoes that: ?Do not have high heels. ?Have rubber bottoms. ?Are comfortable and fit you well. ?Are closed at the toe. Do not wear sandals. ?If you use a stepladder: ?Make sure that it is fully opened. Do not climb a closed stepladder. ?Make sure that both sides of the stepladder are locked into place. ?Ask someone to hold it for you, if possible. ?Clearly mark and make sure that you can see: ?Any grab bars or handrails. ?First and last steps. ?Where the edge of each step is. ?Use tools that help you move around (mobility aids) if they are needed. These  include: ?Canes. ?Walkers. ?Scooters. ?Crutches. ?Turn on the lights when you go into a dark area. Replace any light bulbs as soon as they burn out. ?Set up your furniture so you have a clear path. Avoid moving your furniture around. ?If any

## 2021-10-11 NOTE — Progress Notes (Signed)
? ?Subjective:  ? Katherine Carpenter is a 61 y.o. female who presents for Medicare Annual (Subsequent) preventive examination. ? ?I connected with  Danella Sensing on 10/11/21 by a telephone enabled telemedicine application and verified that I am speaking with the correct person using two identifiers. ?  ?I discussed the limitations of evaluation and management by telemedicine. The patient expressed understanding and agreed to proceed. ? ?Patient location: home ? ?Provider location:Tele-Health home ? ? ? ?Review of Systems    ? ?Cardiac Risk Factors include: advanced age (>55mn, >>41women);hypertension ? ?   ?Objective:  ?  ?Today's Vitals  ? ?There is no height or weight on file to calculate BMI. ? ? ?  10/11/2021  ?  8:32 AM 12/23/2020  ? 12:05 PM 07/02/2020  ?  3:54 PM 07/02/2020  ?  3:30 PM 10/27/2019  ?  9:43 AM  ?Advanced Directives  ?Does Patient Have a Medical Advance Directive? No No No No No  ?Would patient like information on creating a medical advance directive? No - Patient declined No - Patient declined No - Patient declined No - Patient declined Yes (MAU/Ambulatory/Procedural Areas - Information given)  ? ? ?Current Medications (verified) ?Outpatient Encounter Medications as of 10/11/2021  ?Medication Sig  ? ALPRAZolam (XANAX) 1 MG tablet Take 1 mg by mouth 5 (five) times daily. Patient may take up to 5 times a day  ? amphetamine-dextroamphetamine (ADDERALL XR) 30 MG 24 hr capsule Take 30 mg by mouth every morning.  ? augmented betamethasone dipropionate (DIPROLENE-AF) 0.05 % cream Apply topically 2 (two) times daily.  ? buPROPion (WELLBUTRIN XL) 300 MG 24 hr tablet Take 300 mg by mouth every morning.  ? Calcium Carbonate-Vitamin D 500-125 MG-UNIT TABS Take by mouth.  ? carbamazepine (TEGRETOL) 200 MG tablet TAKE ONE TABLET BY MOUTH EVERY MORNING AND THREE TABLETS AT BEDTIME  ? docusate sodium (COLACE) 100 MG capsule 1 capsule as needed  ? Dupilumab (DUPIXENT) 300 MG/2ML SOPN Inject 300 mg into the skin  every 14 (fourteen) days. Starting at day 15 for maintenance.  ? FLUoxetine (PROZAC) 40 MG capsule Take 40 mg by mouth at bedtime.  ? fluticasone (FLONASE) 50 MCG/ACT nasal spray Place 1 spray into both nostrils 2 (two) times daily.  ? hydrochlorothiazide (HYDRODIURIL) 25 MG tablet   ? Ivermectin (SOOLANTRA) 1 % CREA Apply to face once daily  ? lamoTRIgine (LAMICTAL) 200 MG tablet 1 tablet  ? metoprolol succinate (TOPROL-XL) 25 MG 24 hr tablet TAKE 0.5 TABLETS (12.5 MG TOTAL) BY MOUTH DAILY. FOR HEART RATE AND BLOOD PRESSURE.  ? metroNIDAZOLE (METROGEL) 0.75 % gel Apply topically.  ? Multiple Vitamin (MULTI-VITAMINS) TABS Take by mouth.  ? OLANZapine (ZYPREXA) 10 MG tablet Take 10 mg by mouth at bedtime.  ? olanzapine-FLUoxetine (SYMBYAX) 12-25 MG per capsule Take 1 capsule by mouth every evening.  ? omeprazole (PRILOSEC) 40 MG capsule Take 1 capsule (40 mg total) by mouth daily. For heartburn. Office visit required for further refills.  ? ondansetron (ZOFRAN-ODT) 8 MG disintegrating tablet Take 1 tablet (8 mg total) by mouth every 8 (eight) hours as needed for nausea.  ? simvastatin (ZOCOR) 10 MG tablet TAKE 1 TABLET BY MOUTH DAILY. FOR CHOLESTEROL  ? Oxymetazoline HCl (RHOFADE) 1 % CREA Apply once daily in the morning. (Patient not taking: Reported on 10/11/2021)  ? ?No facility-administered encounter medications on file as of 10/11/2021.  ? ? ?Allergies (verified) ?Codeine  ? ?History: ?Past Medical History:  ?Diagnosis Date  ?  Depression   ? Hypertension   ? ?Past Surgical History:  ?Procedure Laterality Date  ? CERVICAL BIOPSY  W/ LOOP ELECTRODE EXCISION  2016  ? ESOPHAGOGASTRODUODENOSCOPY (EGD) WITH PROPOFOL N/A 10/27/2019  ? Procedure: ESOPHAGOGASTRODUODENOSCOPY (EGD) WITH PROPOFOL;  Surgeon: Virgel Manifold, MD;  Location: [facility] ENDOSCOPY;  Service: Endoscopy;  Laterality: N/A;  ? TUBAL LIGATION    ? ?Family History  ?Problem Relation Age of Onset  ? Asthma Mother   ? Depression Daughter   ? Arthritis  Maternal Grandmother   ? Cancer Maternal Grandmother   ?     lung  ? Cancer Paternal Grandmother   ? Leukemia Paternal Grandmother   ? ?Social History  ? ?Socioeconomic History  ? Marital status: Married  ?  Spouse name: Not on file  ? Number of children: Not on file  ? Years of education: Not on file  ? Highest education level: Not on file  ?Occupational History  ? Not on file  ?Tobacco Use  ? Smoking status: Never  ? Smokeless tobacco: Never  ?Substance and Sexual Activity  ? Alcohol use: No  ?  Comment: Pt denies  ? Drug use: No  ?  Comment: Pt denies  ? Sexual activity: Not on file  ?Other Topics Concern  ? Not on file  ?Social History Narrative  ? Not on file  ? ?Social Determinants of Health  ? ?Financial Resource Strain: Low Risk   ? Difficulty of Paying Living Expenses: Not hard at all  ?Food Insecurity: No Food Insecurity  ? Worried About Charity fundraiser in the Last Year: Never true  ? Ran Out of Food in the Last Year: Never true  ?Transportation Needs: No Transportation Needs  ? Lack of Transportation (Medical): No  ? Lack of Transportation (Non-Medical): No  ?Physical Activity: Insufficiently Active  ? Days of Exercise per Week: 3 days  ? Minutes of Exercise per Session: 40 min  ?Stress: No Stress Concern Present  ? Feeling of Stress : Not at all  ?Social Connections: Socially Integrated  ? Frequency of Communication with Friends and Family: More than three times a week  ? Frequency of Social Gatherings with Friends and Family: Twice a week  ? Attends Religious Services: More than 4 times per year  ? Active Member of Clubs or Organizations: Yes  ? Attends Archivist Meetings: More than 4 times per year  ? Marital Status: Married  ? ? ?Tobacco Counseling ?Counseling given: Not Answered ? ? ?Clinical Intake: ? ?Pre-visit preparation completed: Yes ? ?Pain : No/denies pain ? ?  ? ?Nutritional Risks: None ?Diabetes: No ? ?How often do you need to have someone help you when you read instructions,  pamphlets, or other written materials from your doctor or pharmacy?: 1 - Never ? ?Diabetic?  no ? ?Interpreter Needed?: No ? ?Information entered by :: Leroy Kennedy LPN ? ? ?Activities of Daily Living ? ?  10/11/2021  ?  8:46 AM  ?In your present state of health, do you have any difficulty performing the following activities:  ?Hearing? 0  ?Vision? 0  ?Difficulty concentrating or making decisions? 0  ?Walking or climbing stairs? 0  ?Dressing or bathing? 0  ?Doing errands, shopping? 0  ?Preparing Food and eating ? N  ?Using the Toilet? N  ?In the past six months, have you accidently leaked urine? N  ?Do you have problems with loss of bowel control? N  ?Managing your Medications? N  ?Managing your Finances?  N  ? ? ?Patient Care Team: ?Pleas Koch, NP as PCP - General (Internal Medicine) ?Vladimir Crofts, MD as Consulting Physician (Neurology) ? ?Indicate any recent Medical Services you may have received from other than [facility] providers in the past year (date may be approximate). ? ?   ?Assessment:  ? This is a routine wellness examination for [place]. ? ?Hearing/Vision screen ?Hearing Screening - Comments:: No trouble hearing ?Vision Screening - Comments:: Up to date ?[place] ? ?Dietary issues and exercise activities discussed: ?Current Exercise Habits: Home exercise routine, Type of exercise: treadmill, Time (Minutes): 40, Frequency (Times/Week): 3, Weekly Exercise (Minutes/Week): 120, Intensity: Mild ? ? Goals Addressed   ? ?  ?  ?  ?  ? This Visit's Progress  ?  Weight (lb) < 200 lb (90.7 kg)     ?  Would like to get to 150 weight goal ?  ? ?  ? ?Depression Screen ? ?  10/11/2021  ?  8:41 AM 07/02/2020  ?  3:55 PM 05/13/2018  ?  8:45 AM  ?PHQ 2/9 Scores  ?PHQ - 2 Score 0 0 0  ?PHQ- 9 Score  0   ?  ?Fall Risk ? ?  10/11/2021  ?  8:32 AM 07/02/2020  ?  3:31 PM 05/13/2018  ?  8:45 AM  ?Fall Risk   ?Falls in the past year? 0 1 0  ?Number falls in past yr: 0 1 0  ?Injury with Fall? 0 0   ?Risk for fall due to :   Medication side effect   ?Follow up Falls evaluation completed;Education provided;Falls prevention discussed Falls evaluation completed;Falls prevention discussed   ? ? ?FALL RISK PREVENTION PERTAINING TO THE

## 2021-10-22 DIAGNOSIS — Z1211 Encounter for screening for malignant neoplasm of colon: Secondary | ICD-10-CM

## 2021-10-26 ENCOUNTER — Telehealth: Payer: Self-pay

## 2021-10-26 NOTE — Telephone Encounter (Signed)
CALLED PATIENT NO ANSWER LEFT VOICEMAIL FOR A CALL BACK ? ?

## 2021-10-27 ENCOUNTER — Telehealth: Payer: Self-pay

## 2021-10-27 NOTE — Telephone Encounter (Signed)
CALLED PATIENT NO ANSWER LEFT VOICEMAIL FOR A CALL BACK ? ?

## 2021-10-28 ENCOUNTER — Other Ambulatory Visit: Payer: Self-pay

## 2021-10-28 DIAGNOSIS — Z86010 Personal history of colonic polyps: Secondary | ICD-10-CM

## 2021-10-28 MED ORDER — PEG 3350-KCL-NA BICARB-NACL 420 G PO SOLR: 4000.0000 mL | Freq: Once | ORAL | 0 refills | Status: AC

## 2021-10-28 NOTE — Progress Notes (Signed)
Gastroenterology Pre-Procedure Review  Request Date: 11/11/2021 Requesting Physician: Dr. Marius Ditch  PATIENT REVIEW QUESTIONS: The patient responded to the following health history questions as indicated:    1. Are you having any GI issues? no 2. Do you have a personal history of Polyps? yes (last colonoscopy ) 3. Do you have a family history of Colon Cancer or Polyps? no 4. Diabetes Mellitus? no 5. Joint replacements in the past 12 months?no 6. Major health problems in the past 3 months?no 7. Any artificial heart valves, MVP, or defibrillator?no    MEDICATIONS & ALLERGIES:    Patient reports the following regarding taking any anticoagulation/antiplatelet therapy:   Plavix, Coumadin, Eliquis, Xarelto, Lovenox, Pradaxa, Brilinta, or Effient? no Aspirin? no  Patient confirms/reports the following medications:  Current Outpatient Medications  Medication Sig Dispense Refill   ALPRAZolam (XANAX) 1 MG tablet Take 1 mg by mouth 5 (five) times daily. Patient may take up to 5 times a day     amphetamine-dextroamphetamine (ADDERALL XR) 30 MG 24 hr capsule Take 30 mg by mouth every morning.     augmented betamethasone dipropionate (DIPROLENE-AF) 0.05 % cream Apply topically 2 (two) times daily.     buPROPion (WELLBUTRIN XL) 300 MG 24 hr tablet Take 300 mg by mouth every morning.     Calcium Carbonate-Vitamin D 500-125 MG-UNIT TABS Take by mouth.     carbamazepine (TEGRETOL) 200 MG tablet TAKE ONE TABLET BY MOUTH EVERY MORNING AND THREE TABLETS AT BEDTIME     docusate sodium (COLACE) 100 MG capsule 1 capsule as needed     Dupilumab (DUPIXENT) 300 MG/2ML SOPN Inject 300 mg into the skin every 14 (fourteen) days. Starting at day 15 for maintenance. 12 mL 1   FLUoxetine (PROZAC) 40 MG capsule Take 40 mg by mouth at bedtime.     fluticasone (FLONASE) 50 MCG/ACT nasal spray Place 1 spray into both nostrils 2 (two) times daily. 16 g 0   hydrochlorothiazide (HYDRODIURIL) 25 MG tablet      Ivermectin  (SOOLANTRA) 1 % CREA Apply to face once daily 30 g 2   lamoTRIgine (LAMICTAL) 200 MG tablet 1 tablet     metoprolol succinate (TOPROL-XL) 25 MG 24 hr tablet TAKE 0.5 TABLETS (12.5 MG TOTAL) BY MOUTH DAILY. FOR HEART RATE AND BLOOD PRESSURE. 45 tablet 3   metroNIDAZOLE (METROGEL) 0.75 % gel Apply topically.     Multiple Vitamin (MULTI-VITAMINS) TABS Take by mouth.     OLANZapine (ZYPREXA) 10 MG tablet Take 10 mg by mouth at bedtime.     olanzapine-FLUoxetine (SYMBYAX) 12-25 MG per capsule Take 1 capsule by mouth every evening.     omeprazole (PRILOSEC) 40 MG capsule Take 1 capsule (40 mg total) by mouth daily. For heartburn. Office visit required for further refills. 90 capsule 0   ondansetron (ZOFRAN-ODT) 8 MG disintegrating tablet Take 1 tablet (8 mg total) by mouth every 8 (eight) hours as needed for nausea. 20 tablet 0   Oxymetazoline HCl (RHOFADE) 1 % CREA Apply once daily in the morning. (Patient not taking: Reported on 10/11/2021) 30 g 2   simvastatin (ZOCOR) 10 MG tablet TAKE 1 TABLET BY MOUTH DAILY. FOR CHOLESTEROL 90 tablet 2   No current facility-administered medications for this visit.    Patient confirms/reports the following allergies:  Allergies  Allergen Reactions   Codeine Other (See Comments)    No orders of the defined types were placed in this encounter.   AUTHORIZATION INFORMATION Primary Insurance: 1D#: Group #:  Secondary  Insurance: 1D#: Group #:  SCHEDULE INFORMATION: Date: 11/11/2021 Time: Location:[facility]

## 2021-10-31 ENCOUNTER — Telehealth: Payer: Self-pay

## 2021-10-31 MED ORDER — SUTAB 1479-225-188 MG PO TABS: 12.0000 | ORAL_TABLET | Freq: Once | ORAL | 0 refills | Status: AC

## 2021-10-31 NOTE — Telephone Encounter (Signed)
Sent in pills for prep as requested by patient

## 2021-11-01 ENCOUNTER — Ambulatory Visit
Admission: RE | Admit: 2021-11-01 | Discharge: 2021-11-01 | Disposition: A | Discharge status: Home / Self Care | Payer: Medicare Other | Source: Ambulatory Visit | Attending: Primary Care | Admitting: Primary Care

## 2021-11-01 DIAGNOSIS — Z1231 Encounter for screening mammogram for malignant neoplasm of breast: Secondary | ICD-10-CM | POA: Diagnosis not present

## 2021-11-02 ENCOUNTER — Other Ambulatory Visit: Payer: Self-pay | Admitting: Primary Care

## 2021-11-02 ENCOUNTER — Telehealth: Payer: Self-pay

## 2021-11-02 DIAGNOSIS — R928 Other abnormal and inconclusive findings on diagnostic imaging of breast: Secondary | ICD-10-CM

## 2021-11-02 DIAGNOSIS — N63 Unspecified lump in unspecified breast: Secondary | ICD-10-CM

## 2021-11-02 NOTE — Telephone Encounter (Signed)
Patient is returning your call and would like a call back

## 2021-11-02 NOTE — Telephone Encounter (Signed)
Called had questions all were answered

## 2021-11-10 DIAGNOSIS — G4733 Obstructive sleep apnea (adult) (pediatric): Secondary | ICD-10-CM | POA: Diagnosis not present

## 2021-11-11 ENCOUNTER — Ambulatory Visit: Payer: Medicare Other | Admitting: Anesthesiology

## 2021-11-11 ENCOUNTER — Encounter
Admission: RE | Disposition: A | Discharge status: Home / Self Care | Payer: Self-pay | Source: Ambulatory Visit | Attending: Gastroenterology

## 2021-11-11 ENCOUNTER — Encounter: Payer: Self-pay | Admitting: Gastroenterology

## 2021-11-11 ENCOUNTER — Ambulatory Visit
Admission: RE | Admit: 2021-11-11 | Discharge: 2021-11-11 | Disposition: A | Discharge status: Home / Self Care | Payer: Medicare Other | Source: Ambulatory Visit | Attending: Gastroenterology | Admitting: Gastroenterology

## 2021-11-11 DIAGNOSIS — Z860100 Personal history of colon polyps, unspecified: Secondary | ICD-10-CM

## 2021-11-11 DIAGNOSIS — K5730 Diverticulosis of large intestine without perforation or abscess without bleeding: Secondary | ICD-10-CM | POA: Diagnosis not present

## 2021-11-11 DIAGNOSIS — K219 Gastro-esophageal reflux disease without esophagitis: Secondary | ICD-10-CM | POA: Insufficient documentation

## 2021-11-11 DIAGNOSIS — F319 Bipolar disorder, unspecified: Secondary | ICD-10-CM | POA: Diagnosis not present

## 2021-11-11 DIAGNOSIS — Z79899 Other long term (current) drug therapy: Secondary | ICD-10-CM | POA: Diagnosis not present

## 2021-11-11 DIAGNOSIS — K635 Polyp of colon: Secondary | ICD-10-CM

## 2021-11-11 DIAGNOSIS — I10 Essential (primary) hypertension: Secondary | ICD-10-CM | POA: Insufficient documentation

## 2021-11-11 DIAGNOSIS — D126 Benign neoplasm of colon, unspecified: Secondary | ICD-10-CM | POA: Diagnosis not present

## 2021-11-11 DIAGNOSIS — Z1211 Encounter for screening for malignant neoplasm of colon: Secondary | ICD-10-CM | POA: Diagnosis not present

## 2021-11-11 DIAGNOSIS — Z8719 Personal history of other diseases of the digestive system: Secondary | ICD-10-CM | POA: Insufficient documentation

## 2021-11-11 HISTORY — PX: COLONOSCOPY WITH PROPOFOL: SHX5780

## 2021-11-11 SURGERY — COLONOSCOPY WITH PROPOFOL
Anesthesia: General

## 2021-11-11 MED ORDER — LIDOCAINE HCL (PF) 2 % IJ SOLN
INTRAMUSCULAR | Status: AC
  Filled 2021-11-11: qty 5

## 2021-11-11 MED ORDER — PROPOFOL 500 MG/50ML IV EMUL
INTRAVENOUS | Status: DC | PRN
  Administered 2021-11-11: 130 ug/kg/min via INTRAVENOUS

## 2021-11-11 MED ORDER — LIDOCAINE HCL (CARDIAC) PF 100 MG/5ML IV SOSY
PREFILLED_SYRINGE | INTRAVENOUS | Status: DC | PRN
  Administered 2021-11-11: 50 mg via INTRAVENOUS

## 2021-11-11 MED ORDER — PROPOFOL 10 MG/ML IV BOLUS
INTRAVENOUS | Status: DC | PRN
  Administered 2021-11-11: 30 mg via INTRAVENOUS
  Administered 2021-11-11: 70 mg via INTRAVENOUS

## 2021-11-11 MED ORDER — DEXMEDETOMIDINE HCL IN NACL 200 MCG/50ML IV SOLN
INTRAVENOUS | Status: DC | PRN
  Administered 2021-11-11 (×2): 8 ug via INTRAVENOUS

## 2021-11-11 MED ORDER — PROPOFOL 500 MG/50ML IV EMUL
INTRAVENOUS | Status: AC
  Filled 2021-11-11: qty 50

## 2021-11-11 MED ORDER — SODIUM CHLORIDE 0.9 % IV SOLN
INTRAVENOUS | Status: DC
  Administered 2021-11-11: 1000 mL via INTRAVENOUS

## 2021-11-11 MED ORDER — PROPOFOL 10 MG/ML IV BOLUS
INTRAVENOUS | Status: AC
  Filled 2021-11-11: qty 20

## 2021-11-11 NOTE — Transfer of Care (Signed)
Immediate Anesthesia Transfer of Care Note  Patient: Katherine Carpenter  Procedure(s) Performed: COLONOSCOPY WITH PROPOFOL  Patient Location: PACU and Endoscopy Unit  Anesthesia Type:General  Level of Consciousness: drowsy and patient cooperative  Airway & Oxygen Therapy: Patient Spontanous Breathing  Post-op Assessment: Report given to RN and Post -op Vital signs reviewed and stable  Post vital signs: Reviewed and stable  Last Vitals:  Vitals Value Taken Time  BP 120/68 11/11/21 0911  Temp 36.1 C 11/11/21 0910  Pulse 71 11/11/21 0913  Resp 17 11/11/21 0914  SpO2 96 % 11/11/21 0913  Vitals shown include unvalidated device data.  Last Pain:  Vitals:   11/11/21 0910  TempSrc: Temporal  PainSc: 0-No pain         Complications: No notable events documented.

## 2021-11-11 NOTE — Anesthesia Postprocedure Evaluation (Signed)
Anesthesia Post Note  Patient: Katherine Carpenter  Procedure(s) Performed: COLONOSCOPY WITH PROPOFOL  Patient location during evaluation: Endoscopy Anesthesia Type: General Level of consciousness: awake and alert Pain management: pain level controlled Vital Signs Assessment: post-procedure vital signs reviewed and stable Respiratory status: spontaneous breathing, nonlabored ventilation and respiratory function stable Cardiovascular status: blood pressure returned to baseline and stable Postop Assessment: no apparent nausea or vomiting Anesthetic complications: no   No notable events documented.   Last Vitals:  Vitals:   11/11/21 0920 11/11/21 0930  BP:  137/76  Pulse: 75   Resp:    Temp:    SpO2: 99% 100%    Last Pain:  Vitals:   11/11/21 0930  TempSrc:   PainSc: 0-No pain                 Iran Ouch

## 2021-11-11 NOTE — Anesthesia Preprocedure Evaluation (Addendum)
Anesthesia Evaluation  Patient identified by MRN, date of birth, ID band Patient awake    Reviewed: Allergy & Precautions, NPO status , Patient's Chart, lab work & pertinent test results, reviewed documented beta blocker date and time   History of Anesthesia Complications Negative for: history of anesthetic complications  Airway Mallampati: III  TM Distance: >3 FB Neck ROM: Full    Dental  (+) Missing,    Pulmonary neg pulmonary ROS, neg sleep apnea, neg COPD, Patient abstained from smoking.Not current smoker,    Pulmonary exam normal breath sounds clear to auscultation       Cardiovascular Exercise Tolerance: Good METShypertension, Pt. on home beta blockers and Pt. on medications (-) CAD and (-) Past MI (-) dysrhythmias  Rhythm:Regular Rate:Normal - Systolic murmurs    Neuro/Psych PSYCHIATRIC DISORDERS Depression Bipolar Disorder negative neurological ROS     GI/Hepatic GERD  Medicated and Controlled,(+)     (-) substance abuse  ,   Endo/Other  neg diabetes  Renal/GU negative Renal ROS     Musculoskeletal   Abdominal (+) + obese,   Peds  Hematology   Anesthesia Other Findings Past Medical History: No date: Depression No date: Hypertension  Reproductive/Obstetrics                            Anesthesia Physical  Anesthesia Plan  ASA: II  Anesthesia Plan: General   Post-op Pain Management:    Induction: Intravenous  PONV Risk Score and Plan: 3 and Ondansetron, Propofol infusion and TIVA  Airway Management Planned: Nasal Cannula and Natural Airway  Additional Equipment: None  Intra-op Plan:   Post-operative Plan:   Informed Consent: I have reviewed the patients History and Physical, chart, labs and discussed the procedure including the risks, benefits and alternatives for the proposed anesthesia with the patient or authorized representative who has indicated his/her  understanding and acceptance.     Dental advisory given  Plan Discussed with: CRNA and Surgeon  Anesthesia Plan Comments: (Discussed risks of anesthesia with patient, including possibility of difficulty with spontaneous ventilation under anesthesia necessitating airway intervention, PONV, and rare risks such as cardiac or respiratory or neurological events. Patient understands.)       Anesthesia Quick Evaluation

## 2021-11-11 NOTE — H&P (Signed)
Cephas Darby, MD [address]  [place]  [place], [place] [address]  Main: [phone]  Fax: [phone] Pager: [phone]  Primary Care Physician:  Pleas Koch, NP Primary Gastroenterologist:  Dr. Cephas Darby  Pre-Procedure History & Physical: HPI:  Katherine Carpenter is a 61 y.o. female is here for an colonoscopy.   Past Medical History:  Diagnosis Date   Depression    Hypertension     Past Surgical History:  Procedure Laterality Date   CERVICAL BIOPSY  W/ LOOP ELECTRODE EXCISION  2016   ESOPHAGOGASTRODUODENOSCOPY (EGD) WITH PROPOFOL N/A 10/27/2019   Procedure: ESOPHAGOGASTRODUODENOSCOPY (EGD) WITH PROPOFOL;  Surgeon: Virgel Manifold, MD;  Location: [facility] ENDOSCOPY;  Service: Endoscopy;  Laterality: N/A;   TUBAL LIGATION      Prior to Admission medications   Medication Sig Start Date End Date Taking? Authorizing Provider  amphetamine-dextroamphetamine (ADDERALL XR) 30 MG 24 hr capsule Take 30 mg by mouth every morning. 06/02/21  Yes [provider]  augmented betamethasone dipropionate (DIPROLENE-AF) 0.05 % cream Apply topically 2 (two) times daily.   Yes [provider]  buPROPion (WELLBUTRIN XL) 300 MG 24 hr tablet Take 300 mg by mouth every morning. 06/05/21  Yes [provider]  Calcium Carbonate-Vitamin D 500-125 MG-UNIT TABS Take by mouth.   Yes [provider]  carbamazepine (TEGRETOL) 200 MG tablet TAKE ONE TABLET BY MOUTH EVERY MORNING AND THREE TABLETS AT BEDTIME 07/15/19  Yes [provider]  docusate sodium (COLACE) 100 MG capsule 1 capsule as needed   Yes [provider]  Dupilumab (DUPIXENT) 300 MG/2ML SOPN Inject 300 mg into the skin every 14 (fourteen) days. Starting at day 15 for maintenance. 07/27/21  Yes Moye, [place], MD  FLUoxetine (PROZAC) 40 MG capsule Take 40 mg by mouth at bedtime. 06/19/21  Yes [provider]  fluticasone (FLONASE) 50 MCG/ACT nasal spray Place 1 spray  into both nostrils 2 (two) times daily. 10/15/19  Yes Pleas Koch, NP  hydrochlorothiazide (HYDRODIURIL) 25 MG tablet    Yes [provider]  Ivermectin (SOOLANTRA) 1 % CREA Apply to face once daily 06/09/21  Yes Moye, [place], MD  lamoTRIgine (LAMICTAL) 200 MG tablet 1 tablet   Yes [provider]  metoprolol succinate (TOPROL-XL) 25 MG 24 hr tablet TAKE 0.5 TABLETS (12.5 MG TOTAL) BY MOUTH DAILY. FOR HEART RATE AND BLOOD PRESSURE. 07/24/21  Yes Pleas Koch, NP  Multiple Vitamin (MULTI-VITAMINS) TABS Take by mouth.   Yes [provider]  OLANZapine (ZYPREXA) 10 MG tablet Take 10 mg by mouth at bedtime. 06/19/21  Yes [provider]  olanzapine-FLUoxetine (SYMBYAX) 12-25 MG per capsule Take 1 capsule by mouth every evening.   Yes [provider]  omeprazole (PRILOSEC) 40 MG capsule Take 1 capsule (40 mg total) by mouth daily. For heartburn. Office visit required for further refills. 06/19/21  Yes Pleas Koch, NP  simvastatin (ZOCOR) 10 MG tablet TAKE 1 TABLET BY MOUTH DAILY. FOR CHOLESTEROL 08/15/21  Yes Pleas Koch, NP  ALPRAZolam Duanne Moron) 1 MG tablet Take 1 mg by mouth 5 (five) times daily. Patient may take up to 5 times a day    [provider]  metroNIDAZOLE (METROGEL) 0.75 % gel Apply topically. Patient not taking: Reported on 11/11/2021 07/18/21   [provider]  ondansetron (ZOFRAN-ODT) 8 MG disintegrating tablet Take 1 tablet (8 mg total) by mouth every 8 (eight) hours as needed for nausea. 02/02/20   Carlean Purl,  Dalbert Batman, FNP  Oxymetazoline HCl (RHOFADE) 1 % CREA Apply once daily in the morning. Patient not taking: Reported on 10/11/2021 06/09/21   Alfonso Patten, MD    Allergies as of 10/28/2021 - Review Complete 10/28/2021  Allergen Reaction Noted   Codeine Other (See Comments) 06/23/2021    Family History  Problem Relation Age of Onset   Asthma Mother    Depression Daughter    Arthritis Maternal  Grandmother    Cancer Maternal Grandmother        lung   Cancer Paternal Grandmother    Leukemia Paternal Grandmother     Social History   Socioeconomic History   Marital status: Married    Spouse name: Not on file   Number of children: Not on file   Years of education: Not on file   Highest education level: Not on file  Occupational History   Not on file  Tobacco Use   Smoking status: Never   Smokeless tobacco: Never  Vaping Use   Vaping Use: Never used  Substance and Sexual Activity   Alcohol use: No    Comment: Pt denies   Drug use: No    Comment: Pt denies   Sexual activity: Not on file  Other Topics Concern   Not on file  Social History Narrative   Not on file   Social Determinants of Health   Financial Resource Strain: Low Risk    Difficulty of Paying Living Expenses: Not hard at all  Food Insecurity: No Food Insecurity   Worried About Charity fundraiser in the Last Year: Never true   [place] in the Last Year: Never true  Transportation Needs: No Transportation Needs   Lack of Transportation (Medical): No   Lack of Transportation (Non-Medical): No  Physical Activity: Insufficiently Active   Days of Exercise per Week: 3 days   Minutes of Exercise per Session: 40 min  Stress: No Stress Concern Present   Feeling of Stress : Not at all  Social Connections: Socially Integrated   Frequency of Communication with Friends and Family: More than three times a week   Frequency of Social Gatherings with Friends and Family: Twice a week   Attends Religious Services: More than 4 times per year   Active Member of [employer] or Organizations: Yes   Attends Music therapist: More than 4 times per year   Marital Status: Married  Human resources officer Violence: Not At Risk   Fear of Current or Ex-Partner: No   Emotionally Abused: No   Physically Abused: No   Sexually Abused: No    Review of Systems: See HPI, otherwise negative ROS  Physical Exam: BP (!)  153/81   Pulse 72   Temp (!) 97.2 F (36.2 C) (Temporal)   Resp 16   Ht '5\' 3"'$  (1.6 m)   Wt 80.4 kg   LMP 09/08/2011   SpO2 99%   BMI 31.42 kg/m  General:   Alert,  pleasant and cooperative in NAD Head:  Normocephalic and atraumatic. Neck:  Supple; no masses or thyromegaly. Lungs:  Clear throughout to auscultation.    Heart:  Regular rate and rhythm. Abdomen:  Soft, nontender and nondistended. Normal bowel sounds, without guarding, and without rebound.   Neurologic:  Alert and  oriented x4;  grossly normal neurologically.  Impression/Plan: Katherine Carpenter is here for an colonoscopy to be performed for h/o colon polyps  Risks, benefits, limitations, and alternatives regarding  colonoscopy have been  reviewed with the patient.  Questions have been answered.  All parties agreeable.   Sherri Sear, MD  11/11/2021, 8:26 AM

## 2021-11-11 NOTE — Op Note (Signed)
[facility] Gastroenterology Patient Name: Katherine Carpenter Procedure Date: 11/11/2021 8:33 AM MRN: [id number] Account #: [id number] Date of Birth: [birth year] Admit Type: Outpatient Age: 61 Room: [facility] ENDO ROOM 2 Gender: Female Note Status: Finalized Instrument Name: Jasper Riling [id number] Procedure:             Colonoscopy Indications:           High risk colon cancer surveillance: Personal history                         of colonic polyps, Last colonoscopy 10 years ago Providers:             Lin Landsman MD, MD Referring MD:          Pleas Koch (Referring MD) Medicines:             General Anesthesia Complications:         No immediate complications. Estimated blood loss: None. Procedure:             Pre-Anesthesia Assessment:                        - Prior to the procedure, a History and Physical was                         performed, and patient medications and allergies were                         reviewed. The patient is competent. The risks and                         benefits of the procedure and the sedation options and                         risks were discussed with the patient. All questions                         were answered and informed consent was obtained.                         Patient identification and proposed procedure were                         verified by the physician, the nurse, the                         anesthesiologist, the anesthetist and the technician                         in the pre-procedure area in the procedure room in the                         endoscopy suite. Mental Status Examination: alert and                         oriented. Airway Examination: normal oropharyngeal                         airway and neck mobility. Respiratory Examination:  clear to auscultation. CV Examination: normal.                         Prophylactic Antibiotics: The patient does not require                          prophylactic antibiotics. Prior Anticoagulants: The                         patient has taken no previous anticoagulant or                         antiplatelet agents. ASA Grade Assessment: II - A                         patient with mild systemic disease. After reviewing                         the risks and benefits, the patient was deemed in                         satisfactory condition to undergo the procedure. The                         anesthesia plan was to use general anesthesia.                         Immediately prior to administration of medications,                         the patient was re-assessed for adequacy to receive                         sedatives. The heart rate, respiratory rate, oxygen                         saturations, blood pressure, adequacy of pulmonary                         ventilation, and response to care were monitored                         throughout the procedure. The physical status of the                         patient was re-assessed after the procedure.                        After obtaining informed consent, the colonoscope was                         passed under direct vision. Throughout the procedure,                         the patient's blood pressure, pulse, and oxygen                         saturations were monitored continuously. The  Colonoscope was introduced through the anus and                         advanced to the the cecum, identified by appendiceal                         orifice and ileocecal valve. The colonoscopy was                         performed without difficulty. The patient tolerated                         the procedure well. The quality of the bowel                         preparation was evaluated using the BB[facility] Bowel                         Preparation Scale) with scores of: Right Colon = 3,                         Transverse Colon = 3 and Left Colon = 3 (entire mucosa                          seen well with no residual staining, small fragments                         of stool or opaque liquid). The total BBPS score                         equals 9. Findings:      The perianal and digital rectal examinations were normal. Pertinent       negatives include normal sphincter tone and no palpable rectal lesions.      A 3 mm polyp was found in the cecum. The polyp was sessile. The polyp       was removed with a jumbo cold forceps. Resection and retrieval were       complete.      The retroflexed view of the distal rectum and anal verge was normal and       showed no anal or rectal abnormalities.      Multiple diverticula were found in the entire colon. Impression:            - One 3 mm polyp in the cecum, removed with a jumbo                         cold forceps. Resected and retrieved.                        - The distal rectum and anal verge are normal on                         retroflexion view.                        - Diverticulosis in the entire examined colon. Recommendation:        - Discharge patient to home (with escort).                        -  Resume previous diet today.                        - Continue present medications.                        - Await pathology results.                        - Repeat colonoscopy in 7-10 years for surveillance                         based on pathology results. Procedure Code(s):     --- Professional ---                        [phone], Colonoscopy, flexible; with biopsy, single or                         multiple Diagnosis Code(s):     --- Professional ---                        Z86.010, Personal history of colonic polyps                        K63.5, Polyp of colon                        K57.30, Diverticulosis of large intestine without                         perforation or abscess without bleeding CPT copyright 2019 American Medical Association. All rights reserved. The codes documented in this report are  preliminary and upon coder review may  be revised to meet current compliance requirements. Dr. Ulyess Mort Lin Landsman MD, MD 11/11/2021 9:12:30 AM This report has been signed electronically. Number of Addenda: 0 Note Initiated On: 11/11/2021 8:33 AM Scope Withdrawal Time: 0 hours 13 minutes 29 seconds  Total Procedure Duration: 0 hours 20 minutes 5 seconds  Estimated Blood Loss:  Estimated blood loss: none.      [facility]

## 2021-11-12 ENCOUNTER — Encounter: Payer: Self-pay | Admitting: Gastroenterology

## 2021-11-14 ENCOUNTER — Encounter: Payer: Self-pay | Admitting: Gastroenterology

## 2021-11-14 DIAGNOSIS — G4733 Obstructive sleep apnea (adult) (pediatric): Secondary | ICD-10-CM | POA: Diagnosis not present

## 2021-11-14 LAB — SURGICAL PATHOLOGY

## 2021-11-16 ENCOUNTER — Ambulatory Visit
Admission: RE | Admit: 2021-11-16 | Discharge: 2021-11-16 | Disposition: A | Discharge status: Home / Self Care | Payer: Medicare Other | Source: Ambulatory Visit | Attending: Primary Care | Admitting: Primary Care

## 2021-11-16 DIAGNOSIS — R928 Other abnormal and inconclusive findings on diagnostic imaging of breast: Secondary | ICD-10-CM | POA: Insufficient documentation

## 2021-11-16 DIAGNOSIS — N63 Unspecified lump in unspecified breast: Secondary | ICD-10-CM

## 2021-11-16 DIAGNOSIS — N6489 Other specified disorders of breast: Secondary | ICD-10-CM | POA: Diagnosis not present

## 2021-12-10 DIAGNOSIS — G4733 Obstructive sleep apnea (adult) (pediatric): Secondary | ICD-10-CM | POA: Diagnosis not present

## 2021-12-16 ENCOUNTER — Other Ambulatory Visit: Payer: Self-pay | Admitting: Dermatology

## 2021-12-16 DIAGNOSIS — L719 Rosacea, unspecified: Secondary | ICD-10-CM

## 2021-12-19 ENCOUNTER — Other Ambulatory Visit: Payer: Self-pay

## 2021-12-19 DIAGNOSIS — L309 Dermatitis, unspecified: Secondary | ICD-10-CM

## 2021-12-19 MED ORDER — DUPIXENT 300 MG/2ML ~~LOC~~ SOAJ: 300.0000 mg | SUBCUTANEOUS | 1 refills | Status: DC

## 2021-12-19 NOTE — Progress Notes (Signed)
RFs of Dupixent to [facility].   Last follow up states patient to follow up in 1 year. aw

## 2021-12-22 DIAGNOSIS — H35372 Puckering of macula, left eye: Secondary | ICD-10-CM | POA: Diagnosis not present

## 2021-12-22 DIAGNOSIS — Z0100 Encounter for examination of eyes and vision without abnormal findings: Secondary | ICD-10-CM | POA: Diagnosis not present

## 2021-12-22 DIAGNOSIS — H2513 Age-related nuclear cataract, bilateral: Secondary | ICD-10-CM | POA: Diagnosis not present

## 2021-12-26 DIAGNOSIS — F3174 Bipolar disorder, in full remission, most recent episode manic: Secondary | ICD-10-CM | POA: Diagnosis not present

## 2021-12-26 DIAGNOSIS — F3176 Bipolar disorder, in full remission, most recent episode depressed: Secondary | ICD-10-CM | POA: Diagnosis not present

## 2021-12-26 DIAGNOSIS — F900 Attention-deficit hyperactivity disorder, predominantly inattentive type: Secondary | ICD-10-CM | POA: Diagnosis not present

## 2021-12-26 DIAGNOSIS — F410 Panic disorder [episodic paroxysmal anxiety] without agoraphobia: Secondary | ICD-10-CM | POA: Diagnosis not present

## 2021-12-29 ENCOUNTER — Other Ambulatory Visit: Payer: Self-pay | Admitting: Dermatology

## 2021-12-29 DIAGNOSIS — L309 Dermatitis, unspecified: Secondary | ICD-10-CM

## 2022-01-10 DIAGNOSIS — G4733 Obstructive sleep apnea (adult) (pediatric): Secondary | ICD-10-CM | POA: Diagnosis not present

## 2022-02-07 DIAGNOSIS — G4733 Obstructive sleep apnea (adult) (pediatric): Secondary | ICD-10-CM | POA: Diagnosis not present

## 2022-02-10 DIAGNOSIS — G4733 Obstructive sleep apnea (adult) (pediatric): Secondary | ICD-10-CM | POA: Diagnosis not present

## 2022-02-23 ENCOUNTER — Other Ambulatory Visit: Payer: Self-pay | Admitting: Dermatology

## 2022-03-07 ENCOUNTER — Ambulatory Visit: Payer: Medicare Other | Admitting: Primary Care

## 2022-03-09 ENCOUNTER — Other Ambulatory Visit: Payer: Self-pay

## 2022-03-09 DIAGNOSIS — L309 Dermatitis, unspecified: Secondary | ICD-10-CM

## 2022-03-09 MED ORDER — DUPIXENT 300 MG/2ML ~~LOC~~ SOAJ: SUBCUTANEOUS | 2 refills | Status: DC

## 2022-03-09 NOTE — Progress Notes (Signed)
Refill request faxed over from [facility]. Per last OV note patient was to continue Dupixent injections and return to clinic for followup in 1 year. Next apt is in January. Escripted

## 2022-03-12 DIAGNOSIS — G4733 Obstructive sleep apnea (adult) (pediatric): Secondary | ICD-10-CM | POA: Diagnosis not present

## 2022-03-22 ENCOUNTER — Other Ambulatory Visit: Payer: Self-pay | Admitting: Dermatology

## 2022-03-22 DIAGNOSIS — L309 Dermatitis, unspecified: Secondary | ICD-10-CM

## 2022-03-25 ENCOUNTER — Encounter: Payer: Self-pay | Admitting: Dermatology

## 2022-03-29 ENCOUNTER — Encounter: Payer: Self-pay | Admitting: Dermatology

## 2022-03-31 ENCOUNTER — Encounter: Payer: Self-pay | Admitting: Dermatology

## 2022-04-05 ENCOUNTER — Encounter: Payer: Self-pay | Admitting: Dermatology

## 2022-04-12 DIAGNOSIS — G4733 Obstructive sleep apnea (adult) (pediatric): Secondary | ICD-10-CM | POA: Diagnosis not present

## 2022-04-19 DIAGNOSIS — E785 Hyperlipidemia, unspecified: Secondary | ICD-10-CM | POA: Diagnosis not present

## 2022-04-19 DIAGNOSIS — F411 Generalized anxiety disorder: Secondary | ICD-10-CM | POA: Diagnosis not present

## 2022-04-19 DIAGNOSIS — Z818 Family history of other mental and behavioral disorders: Secondary | ICD-10-CM | POA: Diagnosis not present

## 2022-04-19 DIAGNOSIS — H269 Unspecified cataract: Secondary | ICD-10-CM | POA: Diagnosis not present

## 2022-04-19 DIAGNOSIS — R69 Illness, unspecified: Secondary | ICD-10-CM | POA: Diagnosis not present

## 2022-04-19 DIAGNOSIS — E669 Obesity, unspecified: Secondary | ICD-10-CM | POA: Diagnosis not present

## 2022-04-19 DIAGNOSIS — Z8249 Family history of ischemic heart disease and other diseases of the circulatory system: Secondary | ICD-10-CM | POA: Diagnosis not present

## 2022-04-19 DIAGNOSIS — Z6833 Body mass index (BMI) 33.0-33.9, adult: Secondary | ICD-10-CM | POA: Diagnosis not present

## 2022-04-19 DIAGNOSIS — F902 Attention-deficit hyperactivity disorder, combined type: Secondary | ICD-10-CM | POA: Diagnosis not present

## 2022-04-19 DIAGNOSIS — F319 Bipolar disorder, unspecified: Secondary | ICD-10-CM | POA: Diagnosis not present

## 2022-04-19 DIAGNOSIS — Z809 Family history of malignant neoplasm, unspecified: Secondary | ICD-10-CM | POA: Diagnosis not present

## 2022-04-19 DIAGNOSIS — L209 Atopic dermatitis, unspecified: Secondary | ICD-10-CM | POA: Diagnosis not present

## 2022-04-19 DIAGNOSIS — I10 Essential (primary) hypertension: Secondary | ICD-10-CM | POA: Diagnosis not present

## 2022-04-25 ENCOUNTER — Telehealth: Payer: Self-pay

## 2022-04-25 NOTE — Telephone Encounter (Signed)
Patient walked in today for a sample of Dupixent '300mg'$  while she is waiting on her assistance approval. 1 Dupixent '300mg'$  Pen given.  Lot:[id number] Exp:12/10/2023

## 2022-05-09 DIAGNOSIS — G4733 Obstructive sleep apnea (adult) (pediatric): Secondary | ICD-10-CM | POA: Diagnosis not present

## 2022-05-12 DIAGNOSIS — G4733 Obstructive sleep apnea (adult) (pediatric): Secondary | ICD-10-CM | POA: Diagnosis not present

## 2022-05-15 ENCOUNTER — Other Ambulatory Visit: Payer: Self-pay | Admitting: Primary Care

## 2022-05-15 ENCOUNTER — Telehealth: Payer: Self-pay

## 2022-05-15 DIAGNOSIS — E785 Hyperlipidemia, unspecified: Secondary | ICD-10-CM

## 2022-05-15 NOTE — Telephone Encounter (Signed)
Patient is still in appeals for her [place] approval. Sample box given today  Lot:[id number] Exp:04/11/2024

## 2022-05-30 ENCOUNTER — Telehealth: Payer: Self-pay

## 2022-05-30 NOTE — Telephone Encounter (Signed)
Patient called in regarding Soolantra Cream. With her new insurance being [employer] is no longer covered and the Ivermectin Cream is over $100. Patient is asking for any alternatives .  She is scheduled for her 1 year follow up 06/14/22.

## 2022-05-31 NOTE — Telephone Encounter (Signed)
I would suggest the [employer] oxymetazoline-ivermectin (generic soolantra and rhofade ingredients) compounded cream. It is around $60 and should be applied once a day in the morning. Alternatively, we could go back to metronidazole twice a day or try a different medicine called azelaic acid (does not work as well as Sales executive for most people, but may be less expensive). If she prefers to see if Azelaic acid is covered, would send to [facility] or [facility] for better price. Thank you!

## 2022-05-31 NOTE — Telephone Encounter (Signed)
Spoke with patient and advised her of information per Dr. Laurence Ferrari. She would like to try her Metronidazole BID at this time. aw

## 2022-06-04 ENCOUNTER — Other Ambulatory Visit: Payer: Self-pay | Admitting: Dermatology

## 2022-06-08 DIAGNOSIS — G4733 Obstructive sleep apnea (adult) (pediatric): Secondary | ICD-10-CM | POA: Diagnosis not present

## 2022-06-12 DIAGNOSIS — G4733 Obstructive sleep apnea (adult) (pediatric): Secondary | ICD-10-CM | POA: Diagnosis not present

## 2022-06-14 ENCOUNTER — Ambulatory Visit: Payer: Medicare HMO | Admitting: Dermatology

## 2022-06-14 VITALS — BP 142/95 | HR 88

## 2022-06-14 DIAGNOSIS — L719 Rosacea, unspecified: Secondary | ICD-10-CM | POA: Diagnosis not present

## 2022-06-14 DIAGNOSIS — Z79899 Other long term (current) drug therapy: Secondary | ICD-10-CM | POA: Diagnosis not present

## 2022-06-14 DIAGNOSIS — L309 Dermatitis, unspecified: Secondary | ICD-10-CM

## 2022-06-14 MED ORDER — DUPIXENT 300 MG/2ML ~~LOC~~ SOAJ: SUBCUTANEOUS | 2 refills | Status: DC

## 2022-06-14 MED ORDER — METRONIDAZOLE 0.75 % EX GEL: CUTANEOUS | 6 refills | Status: DC

## 2022-06-14 MED ORDER — DOXYCYCLINE HYCLATE 20 MG PO TABS: 20.0000 mg | ORAL_TABLET | Freq: Two times a day (BID) | ORAL | 4 refills | Status: DC

## 2022-06-14 NOTE — Patient Instructions (Addendum)
Can stop doxycycline 20 mg by mouth twice daily. Can restart if flared taking 1 capsule daily.  Doxycycline should be taken with food to prevent nausea. Do not lay down for 30 minutes after taking. Be cautious with sun exposure and use good sun protection while on this medication. Pregnant women should not take this medication.   Continue metronidazole 0.75 % gel - apply twice daily to affected areas of face for rosacea  Your prescription was sent to [facility] in [place]. A representative from [facility] will contact you within 3 business hours to verify your address and insurance information to schedule a free delivery. If for any reason you do not receive a phone call from them, please reach out to them. Their phone number is [phone] and their hours are Monday-Friday 9:00 am-5:00 pm.    Continue dupixent injections    Due to recent changes in healthcare laws, you may see results of your pathology and/or laboratory studies on MyChart before the doctors have had a chance to review them. We understand that in some cases there may be results that are confusing or concerning to you. Please understand that not all results are received at the same time and often the doctors may need to interpret multiple results in order to provide you with the best plan of care or course of treatment. Therefore, we ask that you please give [place] 2 business days to thoroughly review all your results before contacting the office for clarification. Should we see a critical lab result, you will be contacted sooner.   If You Need Anything After Your Visit  If you have any questions or concerns for your doctor, please call our main line at [phone] and press option 4 to reach your doctor's medical assistant. If no one answers, please leave a voicemail as directed and we will return your call as soon as possible. Messages left after 4 pm will be answered the following business day.   You may also send  [place] a message via [place]. We typically respond to MyChart messages within 1-2 business days.  For prescription refills, please ask your pharmacy to contact our office. Our fax number is [phone].  If you have an urgent issue when the clinic is closed that cannot wait until the next business day, you can page your doctor at the number below.    Please note that while we do our best to be available for urgent issues outside of office hours, we are not available 24/7.   If you have an urgent issue and are unable to reach [place], you may choose to seek medical care at your doctor's office, retail clinic, urgent care center, or emergency room.  If you have a medical emergency, please immediately call 911 or go to the emergency department.  Pager Numbers  - Dr. Nehemiah Massed: [phone]  - Dr. Laurence Ferrari: [phone]  - Dr. Nicole Kindred: [phone]  In the event of inclement weather, please call our main line at [phone] for an update on the status of any delays or closures.  Dermatology Medication Tips: Please keep the boxes that topical medications come in in order to help keep track of the instructions about where and how to use these. Pharmacies typically print the medication instructions only on the boxes and not directly on the medication tubes.   If your medication is too expensive, please contact our office at [phone] option 4 or send [place] a message through [place].   We are unable to tell what  your co-pay for medications will be in advance as this is different depending on your insurance coverage. However, we may be able to find a substitute medication at lower cost or fill out paperwork to get insurance to cover a needed medication.   If a prior authorization is required to get your medication covered by your insurance company, please allow [place] 1-2 business days to complete this process.  Drug prices often vary depending on where the prescription is filled and some pharmacies may offer  cheaper prices.  The website www.goodrx.com contains coupons for medications through different pharmacies. The prices here do not account for what the cost may be with help from insurance (it may be cheaper with your insurance), but the website can give you the price if you did not use any insurance.  - You can print the associated coupon and take it with your prescription to the pharmacy.  - You may also stop by our office during regular business hours and pick up a GoodRx coupon card.  - If you need your prescription sent electronically to a different pharmacy, notify our office through [facility] or by phone at [phone] option 4.     Si Usted Necesita Algo Despus de Su Visita  Tambin puede enviarnos un mensaje a travs de Pharmacist, community. Por lo general respondemos a los mensajes de MyChart en el transcurso de 1 a 2 das hbiles.  Para renovar recetas, por favor pida a su farmacia que se ponga en contacto con nuestra oficina. Harland Dingwall de fax es [place] [phone].  Si tiene un asunto urgente cuando la clnica est cerrada y que no puede esperar hasta el siguiente da hbil, puede llamar/localizar a su doctor(a) al nmero que aparece a continuacin.   Por favor, tenga en cuenta que aunque hacemos todo lo posible para estar disponibles para asuntos urgentes fuera del horario de [place], no estamos disponibles las 24 horas del da, los 7 das de la [place].   Si tiene un problema urgente y no puede comunicarse con nosotros, puede optar por buscar atencin mdica  en el consultorio de su doctor(a), en una clnica privada, en un centro de atencin urgente o en una sala de emergencias.  Si tiene Engineering geologist, por favor llame inmediatamente al 911 o vaya a la sala de emergencias.  Nmeros de bper  - Dr. Nehemiah Massed: [phone]  - Dra. Moye: [phone]  - Dra. Nicole Kindred: [phone]  En caso de inclemencias del [place], por favor llame a Johnsie Kindred principal al  [phone] para una actualizacin sobre el [place] de cualquier retraso o cierre.  Consejos para la medicacin en dermatologa: Por favor, guarde las cajas en las que vienen los medicamentos de uso tpico para ayudarle a seguir las instrucciones sobre dnde y cmo usarlos. Las farmacias generalmente imprimen las instrucciones del medicamento slo en las cajas y no directamente en los tubos del [place].   Si su medicamento es muy caro, por favor, pngase en contacto con Zigmund Daniel llamando al [phone] y presione la opcin 4 o envenos un mensaje a travs de Pharmacist, community.   No podemos decirle cul ser su copago por los medicamentos por adelantado ya que esto es diferente dependiendo de la cobertura de su seguro. Sin embargo, es posible que podamos encontrar un medicamento sustituto a Electrical engineer un formulario para que el seguro cubra el medicamento que se considera necesario.   Si se requiere una autorizacin previa para que su compaa de seguros [place] su medicamento,  por favor permtanos de 1 a 2 das hbiles para completar este proceso.  Los precios de los medicamentos varan con frecuencia dependiendo del Environmental consultant de dnde se surte la receta y alguna farmacias pueden ofrecer precios ms baratos.  El sitio web www.goodrx.com tiene cupones para medicamentos de Airline pilot. Los precios aqu no tienen en cuenta lo que podra costar con la ayuda del seguro (puede ser ms barato con su seguro), pero el sitio web puede darle el precio si no utiliz Research scientist (physical sciences).  - Puede imprimir el cupn correspondiente y llevarlo con su receta a la farmacia.  - Tambin puede pasar por nuestra oficina durante el horario de atencin regular y Charity fundraiser una tarjeta de cupones de GoodRx.  - Si necesita que su receta se enve electrnicamente a una farmacia diferente, informe a nuestra oficina a travs de MyChart de [place] o por telfono llamando al [phone] y presione la opcin 4.

## 2022-06-14 NOTE — Progress Notes (Unsigned)
Follow-Up Visit   Subjective  Katherine Carpenter is a 62 y.o. female who presents for the following: Rosacea (1 year followup. Patient states she is staying clear using metronidazole 0.75 % cream twice daily and doxycycline 20 mg daily. ) and Eczema (1 year follow up hx of eczema at legs. Patient currently injecting dupixent every 2 weeks. She reports she is staying clear. ).  The following portions of the chart were reviewed this encounter and updated as appropriate:  Tobacco  Allergies  Meds  Problems  Med Hx  Surg Hx  Fam Hx      Review of Systems: No other skin or systemic complaints except as noted in HPI or Assessment and Plan.   Objective  Well appearing patient in no apparent distress; mood and affect are within normal limits.  A focused examination was performed including face and b/l legs. Relevant physical exam findings are noted in the Assessment and Plan.  face Mild mid-face erythema   legs Clear at exam   Assessment & Plan  Rosacea face  Chronic condition with duration or expected duration over one year. Currently well-controlled.  Rosacea is a chronic progressive skin condition usually affecting the face of adults, causing redness and/or acne bumps. It is treatable but not curable. It sometimes affects the eyes (ocular rosacea) as well. It may respond to topical and/or systemic medication and can flare with stress, sun exposure, alcohol, exercise and some foods.  Daily application of broad spectrum spf 30+ sunscreen to face is recommended to reduce flares.  Can stop doxycycline 20 mg tab twice daily with food and see if she remains clear. Can restart just as needed for flares or go back to using daily if needed to keep things controlled  Continue metronidazole 0.75 % gel - apply topically bid to affected areas of face.   Doxycycline should be taken with food to prevent nausea. Do not lay down for 30 minutes after taking. Be cautious with sun exposure and use  good sun protection while on this medication. Pregnant women should not take this medication.   metroNIDAZOLE (METROGEL) 0.75 % gel - face APPLY APPLICATION TOPICALLY TWO (2) TIMES DAILY.  doxycycline (PERIOSTAT) 20 MG tablet - face Take 1 tablet (20 mg total) by mouth 2 (two) times daily with a meal.  Eczema, unspecified type legs  Chronic condition with duration or expected duration over one year. Currently well-controlled.  Has been on Dupixent after severe disease for 15 years despite multiple other therapies. Patient would like to continue dupixent therapy.   Continue Dupixent '300mg'$ /19m every 2 weeks.  Dupilumab (Dupixent) is a treatment given by injection for adults and children with moderate-to-severe atopic dermatitis. Goal is control of skin condition, not cure. It is given as 2 injections at the first dose followed by 1 injection ever 2 weeks thereafter.  Young children are dosed monthly.  Potential side effects include allergic reaction, herpes infections, injection site reactions and conjunctivitis (inflammation of the eyes).  The use of Dupixent requires long term medication management, including periodic office visits.     Dupilumab (Dupixent) is a treatment given by injection for adults and children with moderate-to-severe atopic dermatitis. Goal is control of skin condition, not cure. It is given as 2 injections at the first dose followed by 1 injection ever 2 weeks thereafter.  Young children are dosed monthly.   Potential side effects include allergic reaction, herpes infections, injection site reactions and conjunctivitis (inflammation of the eyes).  The use  of Dupixent requires long term medication management, including periodic office visits.  Dupilumab (DUPIXENT) 300 MG/2ML SOPN - legs INJECT 1 PEN ('300MG'$ ) UNDER THE SKIN (SUBCUTANEOUS INJECTION) EVERY 14 DAYS   Return in about 1 year (around 06/15/2023) for rosacea and eczema follow up. I, Ruthell Rummage, CMA, am  acting as scribe for Forest Gleason, MD.  Documentation: I have reviewed the above documentation for accuracy and completeness, and I agree with the above.  Forest Gleason, MD

## 2022-06-15 ENCOUNTER — Encounter: Payer: Self-pay | Admitting: Dermatology

## 2022-06-20 ENCOUNTER — Telehealth: Payer: Self-pay

## 2022-06-20 ENCOUNTER — Other Ambulatory Visit (HOSPITAL_COMMUNITY): Payer: Self-pay

## 2022-06-20 NOTE — Telephone Encounter (Signed)
[facility] pharmacy called and said this pt can no longer get [place] from [facility] she will have to get Dupixent from [facility]

## 2022-06-20 NOTE — Telephone Encounter (Signed)
[facility] called and said this pt can no longer get Dupixent from [facility] she will have to get Dupixent from [facility]. aw

## 2022-07-09 DIAGNOSIS — G4733 Obstructive sleep apnea (adult) (pediatric): Secondary | ICD-10-CM | POA: Diagnosis not present

## 2022-07-13 DIAGNOSIS — G4733 Obstructive sleep apnea (adult) (pediatric): Secondary | ICD-10-CM | POA: Diagnosis not present

## 2022-07-25 ENCOUNTER — Other Ambulatory Visit: Payer: Self-pay

## 2022-07-25 ENCOUNTER — Encounter: Payer: Self-pay | Admitting: Primary Care

## 2022-07-25 ENCOUNTER — Ambulatory Visit (INDEPENDENT_AMBULATORY_CARE_PROVIDER_SITE_OTHER): Payer: Medicare HMO | Admitting: Primary Care

## 2022-07-25 VITALS — BP 112/80 | HR 95 | Temp 98.1°F | Ht 63.0 in | Wt 203.0 lb

## 2022-07-25 DIAGNOSIS — Z0000 Encounter for general adult medical examination without abnormal findings: Secondary | ICD-10-CM | POA: Diagnosis not present

## 2022-07-25 DIAGNOSIS — R7303 Prediabetes: Secondary | ICD-10-CM

## 2022-07-25 DIAGNOSIS — E785 Hyperlipidemia, unspecified: Secondary | ICD-10-CM | POA: Diagnosis not present

## 2022-07-25 DIAGNOSIS — K219 Gastro-esophageal reflux disease without esophagitis: Secondary | ICD-10-CM

## 2022-07-25 DIAGNOSIS — Z86010 Personal history of colonic polyps: Secondary | ICD-10-CM

## 2022-07-25 DIAGNOSIS — F3178 Bipolar disorder, in full remission, most recent episode mixed: Secondary | ICD-10-CM | POA: Diagnosis not present

## 2022-07-25 DIAGNOSIS — R69 Illness, unspecified: Secondary | ICD-10-CM | POA: Diagnosis not present

## 2022-07-25 DIAGNOSIS — L719 Rosacea, unspecified: Secondary | ICD-10-CM | POA: Diagnosis not present

## 2022-07-25 DIAGNOSIS — L309 Dermatitis, unspecified: Secondary | ICD-10-CM

## 2022-07-25 DIAGNOSIS — I10 Essential (primary) hypertension: Secondary | ICD-10-CM

## 2022-07-25 LAB — LIPID PANEL
Cholesterol: 159 mg/dL (ref 0–200)
HDL: 57.7 mg/dL (ref >39.00)
LDL Cholesterol: 89 mg/dL (ref 0–99)
NonHDL: 101.45
Total CHOL/HDL Ratio: 3
Triglycerides: 60 mg/dL (ref 0.0–149.0)
VLDL: 12 mg/dL (ref 0.0–40.0)

## 2022-07-25 LAB — COMPREHENSIVE METABOLIC PANEL
ALT: 19 U/L (ref 0–35)
AST: 15 U/L (ref 0–37)
Albumin: 3.8 g/dL (ref 3.5–5.2)
Alkaline Phosphatase: 94 U/L (ref 39–117)
BUN: 24 mg/dL — ABNORMAL HIGH (ref 6–23)
CO2: 29 mEq/L (ref 19–32)
Calcium: 9.2 mg/dL (ref 8.4–10.5)
Chloride: 104 mEq/L (ref 96–112)
Creatinine, Ser: 0.8 mg/dL (ref 0.40–1.20)
GFR: 79.44 mL/min (ref >60.00)
Glucose, Bld: 97 mg/dL (ref 70–99)
Potassium: 4.7 mEq/L (ref 3.5–5.1)
Sodium: 139 mEq/L (ref 135–145)
Total Bilirubin: 0.2 mg/dL (ref 0.2–1.2)
Total Protein: 5.9 g/dL — ABNORMAL LOW (ref 6.0–8.3)

## 2022-07-25 LAB — CBC
HCT: 39.6 % (ref 36.0–46.0)
Hemoglobin: 13.1 g/dL (ref 12.0–15.0)
MCHC: 33.1 g/dL (ref 30.0–36.0)
MCV: 89.5 fl (ref 78.0–100.0)
Platelets: 211 10*3/uL (ref 150.0–400.0)
RBC: 4.43 Mil/uL (ref 3.87–5.11)
RDW: 13 % (ref 11.5–15.5)
WBC: 5.7 10*3/uL (ref 4.0–10.5)

## 2022-07-25 LAB — TSH: TSH: 2.15 u[IU]/mL (ref 0.35–5.50)

## 2022-07-25 LAB — FOLATE: Folate: 21.8 ng/mL (ref >5.9)

## 2022-07-25 LAB — HEMOGLOBIN A1C: Hgb A1c MFr Bld: 5.6 % (ref 4.6–6.5)

## 2022-07-25 LAB — VITAMIN D 25 HYDROXY (VIT D DEFICIENCY, FRACTURES): VITD: 29.13 ng/mL — ABNORMAL LOW (ref 30.00–100.00)

## 2022-07-25 LAB — VITAMIN B12: Vitamin B-12: 478 pg/mL (ref 211–911)

## 2022-07-25 MED ORDER — DUPIXENT 300 MG/2ML ~~LOC~~ SOAJ: SUBCUTANEOUS | 0 refills | Status: DC

## 2022-07-25 NOTE — Assessment & Plan Note (Signed)
Colonoscopy up-to-date.  Recall due in 2033.

## 2022-07-25 NOTE — Progress Notes (Signed)
Subjective:    Patient ID: Katherine Carpenter, female    DOB: 02-26-1961, 62 y.o.   MRN: [id number]  HPI  Katherine Carpenter is a very pleasant 62 y.o. female who presents today for complete physical and follow up of chronic conditions.  Immunizations: -Tetanus: Completed in 2014 -Influenza: Completed this season -Shingles: Completed Shingrix series  Diet: Fair diet.  Exercise: No regular exercise.  Eye exam: Completes annually  Dental exam: Completes semi-annually   Pap Smear: Completed in 2023 Mammogram: Completed in June 2023  Colonoscopy: Completed in 2023, due 2033  BP Readings from Last 3 Encounters:  07/25/22 112/80  06/14/22 (!) 142/95  11/11/21 137/76      Review of Systems  Constitutional:  Negative for unexpected weight change.  HENT:  Negative for rhinorrhea.   Respiratory:  Negative for cough and shortness of breath.   Cardiovascular:  Negative for chest pain.  Gastrointestinal:  Negative for constipation and diarrhea.  Genitourinary:  Negative for difficulty urinating and menstrual problem.  Musculoskeletal:  Negative for arthralgias and myalgias.  Skin:  Negative for rash.  Allergic/Immunologic: Negative for environmental allergies.  Neurological:  Negative for dizziness, numbness and headaches.  Psychiatric/Behavioral:  The patient is not nervous/anxious.          Past Medical History:  Diagnosis Date   Depression    Hypertension     Social History   Socioeconomic History   Marital status: Married    Spouse name: Not on file   Number of children: Not on file   Years of education: Not on file   Highest education level: Not on file  Occupational History   Not on file  Tobacco Use   Smoking status: Never   Smokeless tobacco: Never  Vaping Use   Vaping Use: Never used  Substance and Sexual Activity   Alcohol use: No    Comment: Pt denies   Drug use: No    Comment: Pt denies   Sexual activity: Not on file  Other Topics Concern    Not on file  Social History Narrative   Not on file   Social Determinants of Health   Financial Resource Strain: Low Risk  (10/11/2021)   Overall Financial Resource Strain (CARDIA)    Difficulty of Paying Living Expenses: Not hard at all  Food Insecurity: No Food Insecurity (10/11/2021)   Hunger Vital Sign    Worried About Running Out of Food in the Last Year: Never true    [place] in the Last Year: Never true  Transportation Needs: No Transportation Needs (10/11/2021)   PRAPARE - Hydrologist (Medical): No    Lack of Transportation (Non-Medical): No  Physical Activity: Insufficiently Active (10/11/2021)   Exercise Vital Sign    Days of Exercise per Week: 3 days    Minutes of Exercise per Session: 40 min  Stress: No Stress Concern Present (10/11/2021)   [place]    Feeling of Stress : Not at all  Social Connections: [place] (10/11/2021)   Social Connection and Isolation Panel [NHANES]    Frequency of Communication with Friends and Family: More than three times a week    Frequency of Social Gatherings with Friends and Family: Twice a week    Attends Religious Services: More than 4 times per year    Active Member of [employer] or Organizations: Yes    Attends Archivist Meetings: More  than 4 times per year    Marital Status: Married  Human resources officer Violence: Not At Risk (10/11/2021)   Humiliation, Afraid, Rape, and Kick questionnaire    Fear of Current or Ex-Partner: No    Emotionally Abused: No    Physically Abused: No    Sexually Abused: No    Past Surgical History:  Procedure Laterality Date   CERVICAL BIOPSY  W/ LOOP ELECTRODE EXCISION  2016   COLONOSCOPY WITH PROPOFOL N/A 11/11/2021   Procedure: COLONOSCOPY WITH PROPOFOL;  Surgeon: Lin Landsman, MD;  Location: [facility] ENDOSCOPY;  Service: Gastroenterology;  Laterality: N/A;   ESOPHAGOGASTRODUODENOSCOPY (EGD)  WITH PROPOFOL N/A 10/27/2019   Procedure: ESOPHAGOGASTRODUODENOSCOPY (EGD) WITH PROPOFOL;  Surgeon: Virgel Manifold, MD;  Location: [facility] ENDOSCOPY;  Service: Endoscopy;  Laterality: N/A;   TUBAL LIGATION      Family History  Problem Relation Age of Onset   Asthma Mother    Depression Daughter    Arthritis Maternal Grandmother    Cancer Maternal Grandmother        lung   Cancer Paternal Grandmother    Leukemia Paternal Grandmother     Allergies  Allergen Reactions   Codeine Other (See Comments)    Current Outpatient Medications on File Prior to Visit  Medication Sig Dispense Refill   ALPRAZolam (XANAX) 1 MG tablet Take 1 mg by mouth 5 (five) times daily. Patient may take up to 5 times a day     amphetamine-dextroamphetamine (ADDERALL XR) 30 MG 24 hr capsule Take 30 mg by mouth every morning.     buPROPion (WELLBUTRIN XL) 300 MG 24 hr tablet Take 300 mg by mouth every morning.     Calcium Carbonate-Vitamin D 500-125 MG-UNIT TABS Take by mouth.     carbamazepine (TEGRETOL) 200 MG tablet TAKE ONE TABLET BY MOUTH EVERY MORNING AND THREE TABLETS AT BEDTIME     Dupilumab (DUPIXENT) 300 MG/2ML SOPN INJECT 1 PEN (300MG) UNDER THE SKIN (SUBCUTANEOUS INJECTION) EVERY 14 DAYS 4 mL 2   FLUoxetine (PROZAC) 40 MG capsule Take 40 mg by mouth at bedtime.     lamoTRIgine (LAMICTAL) 200 MG tablet 1 tablet     metoprolol succinate (TOPROL-XL) 25 MG 24 hr tablet TAKE 0.5 TABLETS (12.5 MG TOTAL) BY MOUTH DAILY. FOR HEART RATE AND BLOOD PRESSURE. 45 tablet 3   metroNIDAZOLE (METROGEL) A999333 % gel APPLY APPLICATION TOPICALLY TWO (2) TIMES DAILY. 45 g 6   Multiple Vitamin (MULTI-VITAMINS) TABS Take by mouth.     OLANZapine (ZYPREXA) 10 MG tablet Take 10 mg by mouth at bedtime.     olanzapine-FLUoxetine (SYMBYAX) 12-25 MG per capsule Take 1 capsule by mouth every evening.     omeprazole (PRILOSEC) 40 MG capsule Take 1 capsule (40 mg total) by mouth daily. For heartburn. Office visit required for further  refills. 90 capsule 0   simvastatin (ZOCOR) 10 MG tablet TAKE 1 TABLET BY MOUTH EVERY DAY FOR CHOLESTEROL 90 tablet 0   No current facility-administered medications on file prior to visit.    BP 112/80   Pulse 95   Temp 98.1 F (36.7 C) (Temporal)   Ht 5' 3"$  (1.6 m)   Wt 203 lb (92.1 kg)   LMP 09/08/2011   SpO2 96%   BMI 35.96 kg/m  Objective:   Physical Exam HENT:     Right Ear: Tympanic membrane and ear canal normal.     Left Ear: Tympanic membrane and ear canal normal.     Nose: Nose normal.  Eyes:     Conjunctiva/sclera: Conjunctivae normal.     Pupils: Pupils are equal, round, and reactive to light.  Neck:     Thyroid: No thyromegaly.  Cardiovascular:     Rate and Rhythm: Normal rate and regular rhythm.     Heart sounds: No murmur heard. Pulmonary:     Effort: Pulmonary effort is normal.     Breath sounds: Normal breath sounds. No rales.  Abdominal:     General: Bowel sounds are normal.     Palpations: Abdomen is soft.     Tenderness: There is no abdominal tenderness.  Musculoskeletal:        General: Normal range of motion.     Cervical back: Neck supple.  Lymphadenopathy:     Cervical: No cervical adenopathy.  Skin:    General: Skin is warm and dry.     Findings: No rash.  Neurological:     Mental Status: She is alert and oriented to person, place, and time.     Cranial Nerves: No cranial nerve deficit.     Deep Tendon Reflexes: Reflexes are normal and symmetric.  Psychiatric:        Mood and Affect: Mood normal.           Assessment & Plan:  Preventative health care Assessment & Plan: Immunizations UTD. Pap smear UTD. Mammogram UTD Colonoscopy UTD, due 2033.  Discussed the importance of a healthy diet and regular exercise in order for weight loss, and to reduce the risk of further co-morbidity.  Exam stable. Labs pending.  Follow up in 1 year for repeat physical.    Gastroesophageal reflux disease, unspecified whether esophagitis  present Assessment & Plan: Controlled.   Continue omeprazole 40 mg daily.    Essential hypertension Assessment & Plan: Controlled.  Continue metoprolol succinate 12.5 mg daily.   Rosacea Assessment & Plan: Controlled.  Following with dermatology.  Continue metronidazole gel 0.75% twice daily as needed. Continue Dupixent injections, 300 mg every 2 weeks.   Bipolar disorder, in full remission, most recent episode mixed [facility]) Assessment & Plan: Controlled.  Following with psychiatry.  Continue Adderall XR 30 mg daily, bupropion XL 300 mg daily, Tegretol 200 mg in a.m. and 6 mg in p.m., fluoxetine 40 mg daily, Lamictal 200 mg daily, olanzapine 10 mg at bedtime, olanzapine-fluoxetine 12-25 milligrams daily.  Orders: -     Lipid panel -     Comprehensive metabolic panel -     CBC -     TSH -     VITAMIN D 25 Hydroxy (Vit-D Deficiency, Fractures) -     Vitamin B12 -     Folate  History of colonic polyps Assessment & Plan: Colonoscopy up-to-date.  Recall due in 2033.    Hyperlipidemia, unspecified hyperlipidemia type Assessment & Plan: Repeat lipid panel pending. Continue simvastatin 10 mg daily.  Orders: -     Lipid panel -     Comprehensive metabolic panel -     CBC -     TSH  Prediabetes Assessment & Plan: Commended her on weight loss over the last year. Repeat A1c pending.  Orders: -     Hemoglobin A1c        Pleas Koch, NP

## 2022-07-25 NOTE — Assessment & Plan Note (Signed)
Immunizations UTD. Pap smear UTD. Mammogram UTD Colonoscopy UTD, due 2033.  Discussed the importance of a healthy diet and regular exercise in order for weight loss, and to reduce the risk of further co-morbidity.  Exam stable. Labs pending.  Follow up in 1 year for repeat physical.

## 2022-07-25 NOTE — Assessment & Plan Note (Signed)
Repeat lipid panel pending. Continue simvastatin 10 mg daily.

## 2022-07-25 NOTE — Assessment & Plan Note (Signed)
Controlled.  Following with psychiatry.  Continue Adderall XR 30 mg daily, bupropion XL 300 mg daily, Tegretol 200 mg in a.m. and 6 mg in p.m., fluoxetine 40 mg daily, Lamictal 200 mg daily, olanzapine 10 mg at bedtime, olanzapine-fluoxetine 12-25 milligrams daily.

## 2022-07-25 NOTE — Assessment & Plan Note (Signed)
Controlled.  Continue omeprazole 40 mg daily. 

## 2022-07-25 NOTE — Assessment & Plan Note (Signed)
Controlled.  Continue metoprolol succinate 12.5 mg daily.

## 2022-07-25 NOTE — Patient Instructions (Signed)
Stop by the lab prior to leaving today. I will notify you of your results once received.   It was a pleasure to see you today!  

## 2022-07-25 NOTE — Assessment & Plan Note (Signed)
Commended her on weight loss over the last year. Repeat A1c pending.

## 2022-07-25 NOTE — Assessment & Plan Note (Signed)
Controlled.  Following with dermatology.  Continue metronidazole gel 0.75% twice daily as needed. Continue Dupixent injections, 300 mg every 2 weeks.

## 2022-07-25 NOTE — Progress Notes (Signed)
Refill request fax received from [facility]. 90 day supply is what was requested. Escripted

## 2022-07-27 DIAGNOSIS — H2513 Age-related nuclear cataract, bilateral: Secondary | ICD-10-CM | POA: Diagnosis not present

## 2022-07-31 NOTE — Telephone Encounter (Signed)
Lab results have been faxed as requested to number provided.

## 2022-08-03 ENCOUNTER — Telehealth: Payer: Self-pay

## 2022-08-03 ENCOUNTER — Other Ambulatory Visit: Payer: Self-pay

## 2022-08-03 DIAGNOSIS — L719 Rosacea, unspecified: Secondary | ICD-10-CM

## 2022-08-03 MED ORDER — DOXYCYCLINE HYCLATE 20 MG PO TABS: 20.0000 mg | ORAL_TABLET | Freq: Two times a day (BID) | ORAL | 3 refills | Status: DC

## 2022-08-03 NOTE — Telephone Encounter (Signed)
Patient called to advise that since being off of doxycycline she has started to break back out at chin and cheeks. Patient's last note said she could try to go off of the doxycycline but could restart as needed for flares or daily if keeping rosacea controlled. Sent in doxycycline 20 mg 1 PO BID with food #60 3 RF to [facility]. Patient advised. Lurlean Horns., RMA

## 2022-08-07 DIAGNOSIS — G4733 Obstructive sleep apnea (adult) (pediatric): Secondary | ICD-10-CM | POA: Diagnosis not present

## 2022-08-07 DIAGNOSIS — K219 Gastro-esophageal reflux disease without esophagitis: Secondary | ICD-10-CM | POA: Diagnosis not present

## 2022-08-07 DIAGNOSIS — I10 Essential (primary) hypertension: Secondary | ICD-10-CM | POA: Diagnosis not present

## 2022-08-07 DIAGNOSIS — R69 Illness, unspecified: Secondary | ICD-10-CM | POA: Diagnosis not present

## 2022-08-07 DIAGNOSIS — L309 Dermatitis, unspecified: Secondary | ICD-10-CM | POA: Diagnosis not present

## 2022-08-07 DIAGNOSIS — E785 Hyperlipidemia, unspecified: Secondary | ICD-10-CM | POA: Diagnosis not present

## 2022-08-07 DIAGNOSIS — Z6835 Body mass index (BMI) 35.0-35.9, adult: Secondary | ICD-10-CM | POA: Diagnosis not present

## 2022-08-07 DIAGNOSIS — Z809 Family history of malignant neoplasm, unspecified: Secondary | ICD-10-CM | POA: Diagnosis not present

## 2022-08-07 DIAGNOSIS — Z008 Encounter for other general examination: Secondary | ICD-10-CM | POA: Diagnosis not present

## 2022-08-08 DIAGNOSIS — G4733 Obstructive sleep apnea (adult) (pediatric): Secondary | ICD-10-CM | POA: Diagnosis not present

## 2022-08-11 DIAGNOSIS — G4733 Obstructive sleep apnea (adult) (pediatric): Secondary | ICD-10-CM | POA: Diagnosis not present

## 2022-08-13 ENCOUNTER — Other Ambulatory Visit: Payer: Self-pay | Admitting: Primary Care

## 2022-08-13 DIAGNOSIS — E785 Hyperlipidemia, unspecified: Secondary | ICD-10-CM

## 2022-08-21 DIAGNOSIS — G4733 Obstructive sleep apnea (adult) (pediatric): Secondary | ICD-10-CM | POA: Diagnosis not present

## 2022-08-22 DIAGNOSIS — H2511 Age-related nuclear cataract, right eye: Secondary | ICD-10-CM | POA: Diagnosis not present

## 2022-08-22 DIAGNOSIS — H2512 Age-related nuclear cataract, left eye: Secondary | ICD-10-CM | POA: Diagnosis not present

## 2022-08-30 ENCOUNTER — Other Ambulatory Visit: Payer: Self-pay

## 2022-08-30 ENCOUNTER — Encounter: Payer: Self-pay | Admitting: Ophthalmology

## 2022-09-06 DIAGNOSIS — G4733 Obstructive sleep apnea (adult) (pediatric): Secondary | ICD-10-CM | POA: Diagnosis not present

## 2022-09-06 NOTE — Discharge Instructions (Signed)

## 2022-09-11 ENCOUNTER — Ambulatory Visit: Payer: Medicare HMO | Admitting: Anesthesiology

## 2022-09-11 ENCOUNTER — Other Ambulatory Visit: Payer: Self-pay

## 2022-09-11 ENCOUNTER — Encounter
Admission: RE | Disposition: A | Discharge status: Home / Self Care | Payer: Self-pay | Source: Home / Self Care | Attending: Ophthalmology

## 2022-09-11 ENCOUNTER — Encounter: Payer: Self-pay | Admitting: Ophthalmology

## 2022-09-11 ENCOUNTER — Ambulatory Visit
Admission: RE | Admit: 2022-09-11 | Discharge: 2022-09-11 | Disposition: A | Discharge status: Home / Self Care | Payer: Medicare HMO | Attending: Ophthalmology | Admitting: Ophthalmology

## 2022-09-11 DIAGNOSIS — I10 Essential (primary) hypertension: Secondary | ICD-10-CM | POA: Insufficient documentation

## 2022-09-11 DIAGNOSIS — G4730 Sleep apnea, unspecified: Secondary | ICD-10-CM | POA: Insufficient documentation

## 2022-09-11 DIAGNOSIS — F319 Bipolar disorder, unspecified: Secondary | ICD-10-CM | POA: Insufficient documentation

## 2022-09-11 DIAGNOSIS — K219 Gastro-esophageal reflux disease without esophagitis: Secondary | ICD-10-CM | POA: Insufficient documentation

## 2022-09-11 DIAGNOSIS — M1990 Unspecified osteoarthritis, unspecified site: Secondary | ICD-10-CM | POA: Insufficient documentation

## 2022-09-11 DIAGNOSIS — Z0000 Encounter for general adult medical examination without abnormal findings: Secondary | ICD-10-CM

## 2022-09-11 DIAGNOSIS — Z86010 Personal history of colonic polyps: Secondary | ICD-10-CM

## 2022-09-11 DIAGNOSIS — R69 Illness, unspecified: Secondary | ICD-10-CM | POA: Diagnosis not present

## 2022-09-11 DIAGNOSIS — Z79899 Other long term (current) drug therapy: Secondary | ICD-10-CM | POA: Insufficient documentation

## 2022-09-11 DIAGNOSIS — H2511 Age-related nuclear cataract, right eye: Secondary | ICD-10-CM | POA: Diagnosis not present

## 2022-09-11 DIAGNOSIS — G4733 Obstructive sleep apnea (adult) (pediatric): Secondary | ICD-10-CM | POA: Diagnosis not present

## 2022-09-11 DIAGNOSIS — K635 Polyp of colon: Secondary | ICD-10-CM

## 2022-09-11 HISTORY — DX: Unspecified osteoarthritis, unspecified site: M19.90

## 2022-09-11 HISTORY — PX: CATARACT EXTRACTION W/PHACO: SHX586

## 2022-09-11 HISTORY — DX: Sleep apnea, unspecified: G47.30

## 2022-09-11 HISTORY — DX: Bipolar disorder, unspecified: F31.9

## 2022-09-11 HISTORY — DX: Gastro-esophageal reflux disease without esophagitis: K21.9

## 2022-09-11 SURGERY — PHACOEMULSIFICATION, CATARACT, WITH IOL INSERTION
Anesthesia: General | Site: Eye | Laterality: Right

## 2022-09-11 MED ORDER — LACTATED RINGERS IV SOLN: INTRAVENOUS | Status: DC

## 2022-09-11 MED ORDER — ONDANSETRON HCL 4 MG/2ML IJ SOLN: 4.0000 mg | Freq: Once | INTRAMUSCULAR | Status: DC

## 2022-09-11 MED ORDER — SIGHTPATH DOSE#1 BSS IO SOLN
INTRAOCULAR | Status: DC | PRN
  Administered 2022-09-11: 65 mL via OPHTHALMIC

## 2022-09-11 MED ORDER — LIDOCAINE HCL (PF) 2 % IJ SOLN
INTRAOCULAR | Status: DC | PRN
  Administered 2022-09-11: 1 mL via INTRAOCULAR

## 2022-09-11 MED ORDER — SIGHTPATH DOSE#1 BSS IO SOLN
INTRAOCULAR | Status: DC | PRN
  Administered 2022-09-11: 15 mL

## 2022-09-11 MED ORDER — SIGHTPATH DOSE#1 NA HYALUR & NA CHOND-NA HYALUR IO KIT
PACK | INTRAOCULAR | Status: DC | PRN
  Administered 2022-09-11: 1 via OPHTHALMIC

## 2022-09-11 MED ORDER — MOXIFLOXACIN HCL 0.5 % OP SOLN
OPHTHALMIC | Status: DC | PRN
  Administered 2022-09-11: .2 mL via OPHTHALMIC

## 2022-09-11 MED ORDER — ONDANSETRON 4 MG PO TBDP
4.0000 mg | ORAL_TABLET | Freq: Once | ORAL | Status: AC
  Administered 2022-09-11: 4 mg via ORAL

## 2022-09-11 MED ORDER — TETRACAINE HCL 0.5 % OP SOLN
1.0000 [drp] | OPHTHALMIC | Status: DC | PRN
  Administered 2022-09-11 (×3): 1 [drp] via OPHTHALMIC

## 2022-09-11 MED ORDER — FENTANYL CITRATE (PF) 100 MCG/2ML IJ SOLN
INTRAMUSCULAR | Status: DC | PRN
  Administered 2022-09-11: 50 ug via INTRAVENOUS
  Administered 2022-09-11 (×2): 25 ug via INTRAVENOUS

## 2022-09-11 MED ORDER — MIDAZOLAM HCL 2 MG/2ML IJ SOLN
INTRAMUSCULAR | Status: DC | PRN
  Administered 2022-09-11: 2 mg via INTRAVENOUS

## 2022-09-11 MED ORDER — ARMC OPHTHALMIC DILATING DROPS
1.0000 | OPHTHALMIC | Status: DC | PRN
  Administered 2022-09-11 (×3): 1 via OPHTHALMIC

## 2022-09-11 SURGICAL SUPPLY — 13 items
CATARACT SUITE SIGHTPATH (MISCELLANEOUS) ×1 IMPLANT
DISSECTOR HYDRO NUCLEUS 50X22 (MISCELLANEOUS) ×1 IMPLANT
FEE CATARACT SUITE SIGHTPATH (MISCELLANEOUS) ×1 IMPLANT
GLOVE SURG GAMMEX PI TX LF 7.5 (GLOVE) ×1 IMPLANT
GLOVE SURG SYN 8.5  E (GLOVE) ×1
GLOVE SURG SYN 8.5 E (GLOVE) ×1 IMPLANT
GLOVE SURG SYN 8.5 PF PI (GLOVE) ×1 IMPLANT
LENS IOL TECNIS EYHANCE 18.5 (Intraocular Lens) IMPLANT
NDL FILTER BLUNT 18X1 1/2 (NEEDLE) ×1 IMPLANT
NEEDLE FILTER BLUNT 18X1 1/2 (NEEDLE) ×1 IMPLANT
SYR 3ML LL SCALE MARK (SYRINGE) ×1 IMPLANT
SYR 5ML LL (SYRINGE) ×1 IMPLANT
WATER STERILE IRR 250ML POUR (IV SOLUTION) ×1 IMPLANT

## 2022-09-11 NOTE — H&P (Signed)
[facility]   Primary Care Physician:  Pleas Koch, NP Ophthalmologist: Dr. Benay Pillow  Pre-Procedure History & Physical: HPI:  Katherine Carpenter is a 62 y.o. female here for cataract surgery.   Past Medical History:  Diagnosis Date   Arthritis    osteo   Bipolar 1 disorder    Depression    GERD (gastroesophageal reflux disease)    Hypertension    Sleep apnea    CPAP    Past Surgical History:  Procedure Laterality Date   CERVICAL BIOPSY  W/ LOOP ELECTRODE EXCISION  2016   COLONOSCOPY WITH PROPOFOL N/A 11/11/2021   Procedure: COLONOSCOPY WITH PROPOFOL;  Surgeon: Lin Landsman, MD;  Location: [facility] ENDOSCOPY;  Service: Gastroenterology;  Laterality: N/A;   ESOPHAGOGASTRODUODENOSCOPY (EGD) WITH PROPOFOL N/A 10/27/2019   Procedure: ESOPHAGOGASTRODUODENOSCOPY (EGD) WITH PROPOFOL;  Surgeon: Virgel Manifold, MD;  Location: [facility] ENDOSCOPY;  Service: Endoscopy;  Laterality: N/A;   TUBAL LIGATION      Prior to Admission medications   Medication Sig Start Date End Date Taking? Authorizing Provider  ALPRAZolam Duanne Moron) 1 MG tablet Take 1 mg by mouth 5 (five) times daily. Patient may take up to 5 times a day / one in am and 2 in evening   Yes [provider]  amphetamine-dextroamphetamine (ADDERALL XR) 30 MG 24 hr capsule Take 30 mg by mouth every morning. 06/02/21  Yes [provider]  buPROPion (WELLBUTRIN XL) 300 MG 24 hr tablet Take 300 mg by mouth every morning. 06/05/21  Yes [provider]  Calcium Carbonate-Vitamin D 500-125 MG-UNIT TABS Take by mouth in the morning and at bedtime.   Yes [provider]  carbamazepine (TEGRETOL) 200 MG tablet TAKE ONE TABLET BY MOUTH EVERY MORNING AND THREE TABLETS AT BEDTIME 07/15/19  Yes [provider]  doxycycline (PERIOSTAT) 20 MG tablet Take 1 tablet (20 mg total) by mouth 2 (two) times daily. With food Patient taking differently: Take 20 mg by mouth daily. With food/ one in  evening 08/03/22  Yes Moye, [place], MD  Dupilumab (DUPIXENT) 300 MG/2ML SOPN INJECT 1 PEN (300MG ) UNDER THE SKIN (SUBCUTANEOUS INJECTION) EVERY 14 DAYS 07/25/22  Yes Moye, [place], MD  FLUoxetine (PROZAC) 40 MG capsule Take 40 mg by mouth at bedtime. 06/19/21  Yes [provider]  lamoTRIgine (LAMICTAL) 200 MG tablet 2 (two) times daily.   Yes [provider]  metoprolol succinate (TOPROL-XL) 25 MG 24 hr tablet TAKE 0.5 TABLETS (12.5 MG TOTAL) BY MOUTH DAILY. FOR HEART RATE AND BLOOD PRESSURE. 07/24/21  Yes Pleas Koch, NP  Multiple Vitamin (MULTI-VITAMINS) TABS Take by mouth in the morning and at bedtime.   Yes [provider]  OLANZapine (ZYPREXA) 10 MG tablet Take 10 mg by mouth at bedtime. 06/19/21  Yes [provider]  olanzapine-FLUoxetine (SYMBYAX) 12-25 MG per capsule Take 1 capsule by mouth every evening.   Yes [provider]  omeprazole (PRILOSEC) 40 MG capsule Take 1 capsule (40 mg total) by mouth daily. For heartburn. Office visit required for further refills. 06/19/21  Yes Pleas Koch, NP  simvastatin (ZOCOR) 10 MG tablet TAKE 1 TABLET BY MOUTH EVERY DAY FOR CHOLESTEROL 08/13/22  Yes Pleas Koch, NP  metroNIDAZOLE (METROGEL) [id number] % gel APPLY APPLICATION TOPICALLY TWO (2) TIMES DAILY. 06/14/22   Moye, [place], MD    Allergies as of 08/01/2022 - Review Complete 07/25/2022  Allergen Reaction Noted   Codeine Other (See Comments) 06/23/2021    Family  History  Problem Relation Age of Onset   Asthma Mother    Depression Daughter    Arthritis Maternal Grandmother    Cancer Maternal Grandmother        lung   Cancer Paternal Grandmother    Leukemia Paternal Grandmother     Social History   Socioeconomic History   Marital status: Married    Spouse name: Not on file   Number of children: Not on file   Years of education: Not on file   Highest education level: Not on file  Occupational History   Not on file  Tobacco Use    Smoking status: Never   Smokeless tobacco: Never  Vaping Use   Vaping Use: Never used  Substance and Sexual Activity   Alcohol use: No    Comment: Pt denies   Drug use: No    Comment: Pt denies   Sexual activity: Not on file  Other Topics Concern   Not on file  Social History Narrative   Not on file   Social Determinants of Health   Financial Resource Strain: Low Risk  (10/11/2021)   Overall Financial Resource Strain (CARDIA)    Difficulty of Paying Living Expenses: Not hard at all  Food Insecurity: No Food Insecurity (10/11/2021)   Hunger Vital Sign    Worried About Running Out of Food in the Last Year: Never true    [place] in the Last Year: Never true  Transportation Needs: No Transportation Needs (10/11/2021)   PRAPARE - Hydrologist (Medical): No    Lack of Transportation (Non-Medical): No  Physical Activity: Insufficiently Active (10/11/2021)   Exercise Vital Sign    Days of Exercise per Week: 3 days    Minutes of Exercise per Session: 40 min  Stress: No Stress Concern Present (10/11/2021)   [place]    Feeling of Stress : Not at all  Social Connections: [place] (10/11/2021)   Social Connection and Isolation Panel [NHANES]    Frequency of Communication with Friends and Family: More than three times a week    Frequency of Social Gatherings with Friends and Family: Twice a week    Attends Religious Services: More than 4 times per year    Active Member of [employer] or Organizations: Yes    Attends Music therapist: More than 4 times per year    Marital Status: Married  Human resources officer Violence: Not At Risk (10/11/2021)   Humiliation, Afraid, Rape, and Kick questionnaire    Fear of Current or Ex-Partner: No    Emotionally Abused: No    Physically Abused: No    Sexually Abused: No    Review of Systems: See HPI, otherwise negative ROS  Physical  Exam: BP (!) 140/66   Pulse 72   Temp 97.6 F (36.4 C) (Temporal)   Resp 18   Ht 5\' 3"  (1.6 m)   Wt 93.9 kg   LMP 09/08/2011   SpO2 97%   BMI 36.67 kg/m  General:   Alert, cooperative in NAD Head:  Normocephalic and atraumatic. Respiratory:  Normal work of breathing. Cardiovascular:  RRR  Impression/Plan: Katherine Carpenter is here for cataract surgery.  Risks, benefits, limitations, and alternatives regarding cataract surgery have been reviewed with the patient.  Questions have been answered.  All parties agreeable.   Benay Pillow, MD  09/11/2022, 9:33 AM

## 2022-09-11 NOTE — Anesthesia Preprocedure Evaluation (Addendum)
Anesthesia Evaluation  Patient identified by MRN, date of birth, ID band Patient awake    Reviewed: reviewed documented beta blocker date and time   Airway Mallampati: III  TM Distance: >3 FB Neck ROM: Full    Dental no notable dental hx.    Pulmonary sleep apnea and Continuous Positive Airway Pressure Ventilation    Pulmonary exam normal breath sounds clear to auscultation       Cardiovascular hypertension, Pt. on home beta blockers  Rhythm:Regular Rate:Normal     Neuro/Psych  PSYCHIATRIC DISORDERS  Depression Bipolar Disorder      GI/Hepatic Neg liver ROS,GERD  Controlled,,  Endo/Other  negative endocrine ROS    Renal/GU      Musculoskeletal  (+) Arthritis , Osteoarthritis,    Abdominal Normal abdominal exam  (+)   Peds  Hematology negative hematology ROS (+)   Anesthesia Other Findings Hx eczema per record  Reproductive/Obstetrics                             Anesthesia Physical Anesthesia Plan  ASA: 2  Anesthesia Plan: General   Post-op Pain Management:    Induction:   PONV Risk Score and Plan: 1 and TIVA  Airway Management Planned: Nasal Cannula  Additional Equipment:   Intra-op Plan:   Post-operative Plan:   Informed Consent: I have reviewed the patients History and Physical, chart, labs and discussed the procedure including the risks, benefits and alternatives for the proposed anesthesia with the patient or authorized representative who has indicated his/her understanding and acceptance.       Plan Discussed with: CRNA  Anesthesia Plan Comments:        Anesthesia Quick Evaluation

## 2022-09-11 NOTE — Transfer of Care (Signed)
Immediate Anesthesia Transfer of Care Note  Patient: Katherine Carpenter  Procedure(s) Performed: CATARACT EXTRACTION PHACO AND INTRAOCULAR LENS PLACEMENT (IOC) RIGHT  2.97  00:21.1 (Right: Eye)  Patient Location: PACU  Anesthesia Type: General  Level of Consciousness: awake, alert  and patient cooperative  Airway and Oxygen Therapy: Patient Spontanous Breathing and Patient connected to supplemental oxygen  Post-op Assessment: Post-op Vital signs reviewed, Patient's Cardiovascular Status Stable, Respiratory Function Stable, Patent Airway and No signs of Nausea or vomiting  Post-op Vital Signs: Reviewed and stable  Complications: No notable events documented.

## 2022-09-11 NOTE — Op Note (Signed)
OPERATIVE NOTE  Katherine Carpenter [id number] 09/11/2022   PREOPERATIVE DIAGNOSIS:  Nuclear sclerotic cataract right eye.  H25.11   POSTOPERATIVE DIAGNOSIS:    Nuclear sclerotic cataract right eye.     PROCEDURE:  Phacoemusification with posterior chamber intraocular lens placement of the right eye   LENS:   Implant Name Type Inv. Item Serial No. Manufacturer Lot No. LRB No. Used Action  LENS IOL TECNIS EYHANCE 18.5 - [id number] Intraocular Lens LENS IOL TECNIS EYHANCE 18.5 [id number] SIGHTPATH  Right 1 Implanted       Procedure(s): CATARACT EXTRACTION PHACO AND INTRAOCULAR LENS PLACEMENT (IOC) RIGHT  2.97  00:21.1 (Right)  DIB00 +18.5   ULTRASOUND TIME: 0 minutes 21 seconds.  CDE 2.97   SURGEON:  Benay Pillow, MD, MPH  ANESTHESIOLOGIST: Anesthesiologist: Helayne Seminole, MD CRNA: [place], [place], CRNA   ANESTHESIA:  Topical with tetracaine drops augmented with 1% preservative-free intracameral lidocaine.  ESTIMATED BLOOD LOSS: less than 1 mL.   COMPLICATIONS:  None.   DESCRIPTION OF PROCEDURE:  The patient was identified in the holding room and transported to the operating room and placed in the supine position under the operating microscope.  The right eye was identified as the operative eye and it was prepped and draped in the usual sterile ophthalmic fashion.   A 1.0 millimeter clear-corneal paracentesis was made at the 10:30 position. 0.5 ml of preservative-free 1% lidocaine with epinephrine was injected into the anterior chamber.  The anterior chamber was filled with viscoelastic.  A 2.4 millimeter keratome was used to make a near-clear corneal incision at the 8:00 position.  A curvilinear capsulorrhexis was made with a cystotome and capsulorrhexis forceps.  Balanced salt solution was used to hydrodissect and hydrodelineate the nucleus.   Phacoemulsification was then used in stop and chop fashion to remove the lens nucleus and epinucleus.  The remaining cortex was then  removed using the irrigation and aspiration handpiece. Viscoelastic was then placed into the capsular bag to distend it for lens placement.  A lens was then injected into the capsular bag.  The remaining viscoelastic was aspirated.   Wounds were hydrated with balanced salt solution.  The anterior chamber was inflated to a physiologic pressure with balanced salt solution.   Intracameral vigamox 0.1 mL undiluted was injected into the eye and a drop placed onto the ocular surface.  No wound leaks were noted.  The patient was taken to the recovery room in stable condition without complications of anesthesia or surgery  Benay Pillow 09/11/2022, 9:57 AM

## 2022-09-11 NOTE — Addendum Note (Signed)
Addendum  created 09/11/22 1445 by Helayne Seminole, MD   Attestation recorded in [place], [place] filed

## 2022-09-11 NOTE — Anesthesia Postprocedure Evaluation (Signed)
Anesthesia Post Note  Patient: Katherine Carpenter  Procedure(s) Performed: CATARACT EXTRACTION PHACO AND INTRAOCULAR LENS PLACEMENT (IOC) RIGHT  2.97  00:21.1 (Right: Eye)  Patient location during evaluation: Phase II Anesthesia Type: General Level of consciousness: awake Pain management: satisfactory to patient Vital Signs Assessment: post-procedure vital signs reviewed and stable Respiratory status: spontaneous breathing Cardiovascular status: stable Anesthetic complications: no   No notable events documented.   Last Vitals:  Vitals:   09/11/22 0958 09/11/22 1002  BP: 133/80 (!) 152/80  Pulse: 75 71  Resp: 14 14  Temp: 36.5 C 36.5 C  SpO2: 97% 99%    Last Pain:  Vitals:   09/11/22 1002  TempSrc:   PainSc: 0-No pain                 Lilyian Quayle C Annalis Kaczmarczyk

## 2022-09-12 ENCOUNTER — Encounter: Payer: Self-pay | Admitting: Ophthalmology

## 2022-09-15 NOTE — Anesthesia Preprocedure Evaluation (Addendum)
Anesthesia Evaluation  Patient identified by MRN, date of birth, ID band Patient awake    Reviewed: Allergy & Precautions, H&P , NPO status , Patient's Chart, lab work & pertinent test results  History of Anesthesia Complications (+) PONV and history of anesthetic complications  Airway Mallampati: III  TM Distance: >3 FB Neck ROM: Full    Dental no notable dental hx.    Pulmonary sleep apnea    Pulmonary exam normal breath sounds clear to auscultation       Cardiovascular hypertension, Normal cardiovascular exam Rhythm:Regular Rate:Normal     Neuro/Psych  PSYCHIATRIC DISORDERS Anxiety Depression Bipolar Disorder   negative neurological ROS     GI/Hepatic negative GI ROS, Neg liver ROS,GERD  Medicated and Controlled,,  Endo/Other  negative endocrine ROS    Renal/GU negative Renal ROS  negative genitourinary   Musculoskeletal  (+) Arthritis , Osteoarthritis,    Abdominal   Peds negative pediatric ROS (+)  Hematology negative hematology ROS (+)   Anesthesia Other Findings Hypertension  Depression Sleep apnea  GERD (gastroesophageal reflux disease) Bipolar 1 disorder  Arthritis    Reproductive/Obstetrics negative OB ROS                             Anesthesia Physical Anesthesia Plan  ASA: 2  Anesthesia Plan: MAC   Post-op Pain Management:    Induction: Intravenous  PONV Risk Score and Plan:   Airway Management Planned: Natural Airway and Nasal Cannula  Additional Equipment:   Intra-op Plan:   Post-operative Plan:   Informed Consent: I have reviewed the patients History and Physical, chart, labs and discussed the procedure including the risks, benefits and alternatives for the proposed anesthesia with the patient or authorized representative who has indicated his/her understanding and acceptance.     Dental Advisory Given  Plan Discussed with: Anesthesiologist, CRNA  and Surgeon  Anesthesia Plan Comments: (Patient consented for risks of anesthesia including but not limited to:  - adverse reactions to medications - damage to eyes, teeth, lips or other oral mucosa - nerve damage due to positioning  - sore throat or hoarseness - Damage to heart, brain, nerves, lungs, other parts of body or loss of life  Patient voiced understanding.)        Anesthesia Quick Evaluation

## 2022-09-20 DIAGNOSIS — H2512 Age-related nuclear cataract, left eye: Secondary | ICD-10-CM | POA: Diagnosis not present

## 2022-09-25 ENCOUNTER — Encounter: Payer: Self-pay | Admitting: Ophthalmology

## 2022-09-25 ENCOUNTER — Other Ambulatory Visit: Payer: Self-pay

## 2022-09-25 ENCOUNTER — Ambulatory Visit
Admission: RE | Admit: 2022-09-25 | Discharge: 2022-09-25 | Disposition: A | Discharge status: Home / Self Care | Payer: Medicare HMO | Attending: Ophthalmology | Admitting: Ophthalmology

## 2022-09-25 ENCOUNTER — Other Ambulatory Visit: Payer: Self-pay | Admitting: Primary Care

## 2022-09-25 ENCOUNTER — Ambulatory Visit: Payer: Medicare HMO | Admitting: Anesthesiology

## 2022-09-25 ENCOUNTER — Encounter
Admission: RE | Disposition: A | Discharge status: Home / Self Care | Payer: Self-pay | Source: Home / Self Care | Attending: Ophthalmology

## 2022-09-25 DIAGNOSIS — I10 Essential (primary) hypertension: Secondary | ICD-10-CM

## 2022-09-25 DIAGNOSIS — F319 Bipolar disorder, unspecified: Secondary | ICD-10-CM | POA: Diagnosis not present

## 2022-09-25 DIAGNOSIS — Z79899 Other long term (current) drug therapy: Secondary | ICD-10-CM | POA: Insufficient documentation

## 2022-09-25 DIAGNOSIS — G4730 Sleep apnea, unspecified: Secondary | ICD-10-CM | POA: Insufficient documentation

## 2022-09-25 DIAGNOSIS — K219 Gastro-esophageal reflux disease without esophagitis: Secondary | ICD-10-CM | POA: Diagnosis not present

## 2022-09-25 DIAGNOSIS — R69 Illness, unspecified: Secondary | ICD-10-CM | POA: Diagnosis not present

## 2022-09-25 DIAGNOSIS — H2512 Age-related nuclear cataract, left eye: Secondary | ICD-10-CM | POA: Diagnosis not present

## 2022-09-25 DIAGNOSIS — E669 Obesity, unspecified: Secondary | ICD-10-CM | POA: Diagnosis not present

## 2022-09-25 DIAGNOSIS — M1990 Unspecified osteoarthritis, unspecified site: Secondary | ICD-10-CM | POA: Diagnosis not present

## 2022-09-25 DIAGNOSIS — F419 Anxiety disorder, unspecified: Secondary | ICD-10-CM | POA: Insufficient documentation

## 2022-09-25 DIAGNOSIS — Z6837 Body mass index (BMI) 37.0-37.9, adult: Secondary | ICD-10-CM | POA: Diagnosis not present

## 2022-09-25 HISTORY — DX: Nausea with vomiting, unspecified: R11.2

## 2022-09-25 HISTORY — DX: Other specified postprocedural states: R11.2

## 2022-09-25 HISTORY — DX: Other specified postprocedural states: Z98.890

## 2022-09-25 HISTORY — PX: CATARACT EXTRACTION W/PHACO: SHX586

## 2022-09-25 SURGERY — PHACOEMULSIFICATION, CATARACT, WITH IOL INSERTION
Anesthesia: Monitor Anesthesia Care | Site: Eye | Laterality: Left

## 2022-09-25 MED ORDER — SIGHTPATH DOSE#1 BSS IO SOLN
INTRAOCULAR | Status: DC | PRN
  Administered 2022-09-25: 15 mL

## 2022-09-25 MED ORDER — MOXIFLOXACIN HCL 0.5 % OP SOLN
OPHTHALMIC | Status: DC | PRN
  Administered 2022-09-25: .2 mL via OPHTHALMIC

## 2022-09-25 MED ORDER — ONDANSETRON HCL 4 MG/2ML IJ SOLN
INTRAMUSCULAR | Status: DC | PRN
  Administered 2022-09-25: 4 mg via INTRAVENOUS

## 2022-09-25 MED ORDER — TETRACAINE HCL 0.5 % OP SOLN
1.0000 [drp] | OPHTHALMIC | Status: DC | PRN
  Administered 2022-09-25 (×3): 1 [drp] via OPHTHALMIC

## 2022-09-25 MED ORDER — SIGHTPATH DOSE#1 BSS IO SOLN
INTRAOCULAR | Status: DC | PRN
  Administered 2022-09-25: 67 mL via OPHTHALMIC

## 2022-09-25 MED ORDER — MIDAZOLAM HCL 2 MG/2ML IJ SOLN
INTRAMUSCULAR | Status: DC | PRN
  Administered 2022-09-25: 2 mg via INTRAVENOUS

## 2022-09-25 MED ORDER — FENTANYL CITRATE (PF) 100 MCG/2ML IJ SOLN
INTRAMUSCULAR | Status: DC | PRN
  Administered 2022-09-25 (×2): 50 ug via INTRAVENOUS

## 2022-09-25 MED ORDER — LACTATED RINGERS IV SOLN: INTRAVENOUS | Status: DC

## 2022-09-25 MED ORDER — LIDOCAINE HCL (PF) 2 % IJ SOLN
INTRAOCULAR | Status: DC | PRN
  Administered 2022-09-25: 1 mL via INTRAOCULAR

## 2022-09-25 MED ORDER — SIGHTPATH DOSE#1 NA HYALUR & NA CHOND-NA HYALUR IO KIT
PACK | INTRAOCULAR | Status: DC | PRN
  Administered 2022-09-25: 1 via OPHTHALMIC

## 2022-09-25 MED ORDER — ARMC OPHTHALMIC DILATING DROPS
1.0000 | OPHTHALMIC | Status: DC | PRN
  Administered 2022-09-25 (×3): 1 via OPHTHALMIC

## 2022-09-25 MED ORDER — ONDANSETRON HCL 4 MG/2ML IJ SOLN
4.0000 mg | Freq: Once | INTRAMUSCULAR | Status: AC
  Administered 2022-09-25: 4 mg via INTRAVENOUS

## 2022-09-25 SURGICAL SUPPLY — 12 items
CATARACT SUITE SIGHTPATH (MISCELLANEOUS) ×1 IMPLANT
DISSECTOR HYDRO NUCLEUS 50X22 (MISCELLANEOUS) ×1 IMPLANT
FEE CATARACT SUITE SIGHTPATH (MISCELLANEOUS) ×1 IMPLANT
GLOVE SURG GAMMEX PI TX LF 7.5 (GLOVE) ×1 IMPLANT
GLOVE SURG SYN 8.5  E (GLOVE) ×1
GLOVE SURG SYN 8.5 E (GLOVE) ×1 IMPLANT
GLOVE SURG SYN 8.5 PF PI (GLOVE) ×1 IMPLANT
LENS IOL TECNIS EYHANCE 17.5 (Intraocular Lens) IMPLANT
NDL FILTER BLUNT 18X1 1/2 (NEEDLE) ×1 IMPLANT
NEEDLE FILTER BLUNT 18X1 1/2 (NEEDLE) ×1 IMPLANT
SYR 3ML LL SCALE MARK (SYRINGE) ×1 IMPLANT
SYR 5ML LL (SYRINGE) ×1 IMPLANT

## 2022-09-25 NOTE — H&P (Signed)
[facility]   Primary Care Physician:  Doreene Nest, NP Ophthalmologist: Dr. Willey Blade  Pre-Procedure History & Physical: HPI:  Katherine Carpenter is a 63 y.o. female here for cataract surgery.   Past Medical History:  Diagnosis Date   Arthritis    osteo   Bipolar 1 disorder    Depression    GERD (gastroesophageal reflux disease)    Hypertension    PONV (postoperative nausea and vomiting)    Sleep apnea    CPAP    Past Surgical History:  Procedure Laterality Date   CATARACT EXTRACTION W/PHACO Right 09/11/2022   Procedure: CATARACT EXTRACTION PHACO AND INTRAOCULAR LENS PLACEMENT (IOC) RIGHT  2.97  00:21.1;  Surgeon: [place] Crane, MD;  Location[facility];  Service: Ophthalmology;  Laterality: Right;   CERVICAL BIOPSY  W/ LOOP ELECTRODE EXCISION  2016   COLONOSCOPY WITH PROPOFOL N/A 11/11/2021   Procedure: COLONOSCOPY WITH PROPOFOL;  Surgeon: Toney Reil, MD;  Location: [facility] ENDOSCOPY;  Service: Gastroenterology;  Laterality: N/A;   ESOPHAGOGASTRODUODENOSCOPY (EGD) WITH PROPOFOL N/A 10/27/2019   Procedure: ESOPHAGOGASTRODUODENOSCOPY (EGD) WITH PROPOFOL;  Surgeon: Pasty Spillers, MD;  Location: [facility] ENDOSCOPY;  Service: Endoscopy;  Laterality: N/A;   TUBAL LIGATION      Prior to Admission medications   Medication Sig Start Date End Date Taking? Authorizing Provider  ALPRAZolam Prudy Feeler) 1 MG tablet Take 1 mg by mouth 5 (five) times daily. Patient may take up to 5 times a day / one in am and 2 in evening   Yes [provider]  amphetamine-dextroamphetamine (ADDERALL XR) 30 MG 24 hr capsule Take 30 mg by mouth every morning. 06/02/21  Yes [provider]  buPROPion (WELLBUTRIN XL) 300 MG 24 hr tablet Take 300 mg by mouth every morning. 06/05/21  Yes [provider]  Calcium Carbonate-Vitamin D 500-125 MG-UNIT TABS Take by mouth in the morning and at bedtime.   Yes [provider]  carbamazepine (TEGRETOL) 200  MG tablet TAKE ONE TABLET BY MOUTH EVERY MORNING AND THREE TABLETS AT BEDTIME 07/15/19  Yes [provider]  Dupilumab (DUPIXENT) 300 MG/2ML SOPN INJECT 1 PEN ( ) UNDER THE SKIN (SUBCUTANEOUS INJECTION) EVERY 14 DAYS 07/25/22  Yes Moye, [place], MD  FLUoxetine (PROZAC) 40 MG capsule Take 40 mg by mouth at bedtime. 06/19/21  Yes [provider]  lamoTRIgine (LAMICTAL) 200 MG tablet 2 (two) times daily.   Yes [provider]  metoprolol succinate (TOPROL-XL) 25 MG 24 hr tablet TAKE 0.5 TABLETS (12.5 MG TOTAL) BY MOUTH DAILY. FOR HEART RATE AND BLOOD PRESSURE. 07/24/21  Yes Doreene Nest, NP  Multiple Vitamin (MULTI-VITAMINS) TABS Take by mouth in the morning and at bedtime.   Yes [provider]  OLANZapine (ZYPREXA) 10 MG tablet Take 10 mg by mouth at bedtime. 06/19/21  Yes [provider]  olanzapine-FLUoxetine (SYMBYAX) 12-25 MG per capsule Take 1 capsule by mouth every evening.   Yes [provider]  omeprazole (PRILOSEC) 40 MG capsule Take 1 capsule (40 mg total) by mouth daily. For heartburn. Office visit required for further refills. 06/19/21  Yes Doreene Nest, NP  simvastatin (ZOCOR) 10 MG tablet TAKE 1 TABLET BY MOUTH EVERY DAY FOR CHOLESTEROL 08/13/22  Yes Doreene Nest, NP  doxycycline (PERIOSTAT) 20 MG tablet Take 1 tablet (20 mg total) by mouth 2 (two) times daily. With food Patient taking differently: Take 20 mg by mouth daily. With food/ one in evening 08/03/22  Moye, [place], MD  metroNIDAZOLE (METROGEL) 0.75 % gel APPLY APPLICATION TOPICALLY TWO (2) TIMES DAILY. 06/14/22   Moye, [place], MD    Allergies as of 08/01/2022 - Review Complete 07/25/2022  Allergen Reaction Noted   Codeine Other (See Comments) 06/23/2021    Family History  Problem Relation Age of Onset   Asthma Mother    Depression Daughter    Arthritis Maternal Grandmother    Cancer Maternal Grandmother        lung   Cancer Paternal Grandmother     Leukemia Paternal Grandmother     Social History   Socioeconomic History   Marital status: Married    Spouse name: Not on file   Number of children: Not on file   Years of education: Not on file   Highest education level: Not on file  Occupational History   Not on file  Tobacco Use   Smoking status: Never   Smokeless tobacco: Never  Vaping Use   Vaping Use: Never used  Substance and Sexual Activity   Alcohol use: No    Comment: Pt denies   Drug use: No    Comment: Pt denies   Sexual activity: Not on file  Other Topics Concern   Not on file  Social History Narrative   Not on file   Social Determinants of Health   Financial Resource Strain: Low Risk  (10/11/2021)   Overall Financial Resource Strain (CARDIA)    Difficulty of Paying Living Expenses: Not hard at all  Food Insecurity: No Food Insecurity (10/11/2021)   Hunger Vital Sign    Worried About Running Out of Food in the Last Year: Never true    Ran Out of Food in the Last Year: Never true  Transportation Needs: No Transportation Needs (10/11/2021)   PRAPARE - Administrator, Civil Service (Medical): No    Lack of Transportation (Non-Medical): No  Physical Activity: Insufficiently Active (10/11/2021)   Exercise Vital Sign    Days of Exercise per Week: 3 days    Minutes of Exercise per Session: 40 min  Stress: No Stress Concern Present (10/11/2021)   [employer] of Occupational Health - Occupational Stress Questionnaire    Feeling of Stress : Not at all  Social Connections: [place] (10/11/2021)   Social Connection and Isolation Panel [NHANES]    Frequency of Communication with Friends and Family: More than three times a week    Frequency of Social Gatherings with Friends and Family: Twice a week    Attends Religious Services: More than 4 times per year    Active Member of [employer] or Organizations: Yes    Attends Engineer, structural: More than 4 times per year    Marital Status: Married   Catering manager Violence: Not At Risk (10/11/2021)   Humiliation, Afraid, Rape, and Kick questionnaire    Fear of Current or Ex-Partner: No    Emotionally Abused: No    Physically Abused: No    Sexually Abused: No    Review of Systems: See HPI, otherwise negative ROS  Physical Exam: BP (!) 147/84   Pulse 79   Temp (!) 97.1 F (36.2 C) (Temporal)   Resp 18   Ht 5\' 3"  (1.6 m)   Wt 95.3 kg   LMP 09/08/2011   SpO2 95%   BMI 37.20 kg/m  General:   Alert, cooperative in NAD Head:  Normocephalic and atraumatic. Respiratory:  Normal work of breathing. Cardiovascular:  RRR  Impression/Plan: Katherine Bradford  B Carpenter is here for cataract surgery.  Risks, benefits, limitations, and alternatives regarding cataract surgery have been reviewed with the patient.  Questions have been answered.  All parties agreeable.   Willey Blade, MD  09/25/2022, 11:38 AM

## 2022-09-25 NOTE — Op Note (Signed)
OPERATIVE NOTE  Katherine Carpenter [id number] 09/25/2022   PREOPERATIVE DIAGNOSIS:  Nuclear sclerotic cataract left eye.  H25.12   POSTOPERATIVE DIAGNOSIS:    Nuclear sclerotic cataract left eye.     PROCEDURE:  Phacoemusification with posterior chamber intraocular lens placement of the left eye   LENS:   Implant Name Type Inv. Item Serial No. Manufacturer Lot No. LRB No. Used Action  LENS IOL TECNIS EYHANCE 17.5 - [id number] Intraocular Lens LENS IOL TECNIS EYHANCE 17.5 [id number] SIGHTPATH  Left 1 Implanted      Procedure(s): CATARACT EXTRACTION PHACO AND INTRAOCULAR LENS PLACEMENT (IOC) LEFT  5.35  00:34.4 (Left)  DIB00 +17.5   ULTRASOUND TIME: 0 minutes 34 seconds.  CDE 5.35   SURGEON:  Willey Blade, MD, MPH   ANESTHESIA:  Topical with tetracaine drops augmented with 1% preservative-free intracameral lidocaine.  ESTIMATED BLOOD LOSS: <1 mL   COMPLICATIONS:  None.   DESCRIPTION OF PROCEDURE:  The patient was identified in the holding room and transported to the operating room and placed in the supine position under the operating microscope.  The left eye was identified as the operative eye and it was prepped and draped in the usual sterile ophthalmic fashion.   A 1.0 millimeter clear-corneal paracentesis was made at the 5:00 position. 0.5 ml of preservative-free 1% lidocaine with epinephrine was injected into the anterior chamber.  The anterior chamber was filled with viscoelastic.  A 2.4 millimeter keratome was used to make a near-clear corneal incision at the 2:00 position.  A curvilinear capsulorrhexis was made with a cystotome and capsulorrhexis forceps.  Balanced salt solution was used to hydrodissect and hydrodelineate the nucleus.   Phacoemulsification was then used in stop and chop fashion to remove the lens nucleus and epinucleus.  The remaining cortex was then removed using the irrigation and aspiration handpiece. Viscoelastic was then placed into the capsular bag to  distend it for lens placement.  A lens was then injected into the capsular bag.  The remaining viscoelastic was aspirated.   Wounds were hydrated with balanced salt solution.  The anterior chamber was inflated to a physiologic pressure with balanced salt solution.  Intracameral vigamox 0.1 mL undiltued was injected into the eye and a drop placed onto the ocular surface.  No wound leaks were noted.  The patient was taken to the recovery room in stable condition without complications of anesthesia or surgery  Willey Blade 09/25/2022, 12:04 PM

## 2022-09-25 NOTE — Anesthesia Postprocedure Evaluation (Signed)
Anesthesia Post Note  Patient: Katherine Carpenter  Procedure(s) Performed: CATARACT EXTRACTION PHACO AND INTRAOCULAR LENS PLACEMENT (IOC) LEFT  5.35  00:34.4 (Left: Eye)  Patient location during evaluation: PACU Anesthesia Type: MAC Level of consciousness: awake and alert Pain management: pain level controlled Vital Signs Assessment: post-procedure vital signs reviewed and stable Respiratory status: spontaneous breathing, nonlabored ventilation, respiratory function stable and patient connected to nasal cannula oxygen Cardiovascular status: stable and blood pressure returned to baseline Postop Assessment: no apparent nausea or vomiting Anesthetic complications: no   No notable events documented.   Last Vitals:  Vitals:   09/25/22 1205 09/25/22 1211  BP: (!) 144/90 (!) 145/69  Pulse: 75 73  Resp: 16 20  Temp: (!) 36.1 C (!) 36.1 C  SpO2: 99% 97%    Last Pain:  Vitals:   09/25/22 1211  TempSrc:   PainSc: 0-No pain                 Melvern Ramone C Bartlomiej Jenkinson

## 2022-09-25 NOTE — Transfer of Care (Signed)
Immediate Anesthesia Transfer of Care Note  Patient: Katherine Carpenter  Procedure(s) Performed: CATARACT EXTRACTION PHACO AND INTRAOCULAR LENS PLACEMENT (IOC) LEFT  5.35  00:34.4 (Left: Eye)  Patient Location: PACU  Anesthesia Type: MAC  Level of Consciousness: awake, alert  and patient cooperative  Airway and Oxygen Therapy: Patient Spontanous Breathing and Patient connected to supplemental oxygen  Post-op Assessment: Post-op Vital signs reviewed, Patient's Cardiovascular Status Stable, Respiratory Function Stable, Patent Airway and No signs of Nausea or vomiting  Post-op Vital Signs: Reviewed and stable  Complications: No notable events documented.

## 2022-09-25 NOTE — Discharge Instructions (Signed)

## 2022-09-26 ENCOUNTER — Encounter: Payer: Self-pay | Admitting: Ophthalmology

## 2022-10-07 DIAGNOSIS — G4733 Obstructive sleep apnea (adult) (pediatric): Secondary | ICD-10-CM | POA: Diagnosis not present

## 2022-10-11 DIAGNOSIS — G4733 Obstructive sleep apnea (adult) (pediatric): Secondary | ICD-10-CM | POA: Diagnosis not present

## 2022-10-16 ENCOUNTER — Other Ambulatory Visit: Payer: Self-pay

## 2022-10-16 DIAGNOSIS — L309 Dermatitis, unspecified: Secondary | ICD-10-CM

## 2022-10-16 MED ORDER — DUPIXENT 300 MG/2ML ~~LOC~~ SOAJ
SUBCUTANEOUS | 0 refills | Status: DC
Start: 2022-10-16 — End: 2023-01-10

## 2022-10-18 DIAGNOSIS — Z961 Presence of intraocular lens: Secondary | ICD-10-CM | POA: Diagnosis not present

## 2022-11-01 ENCOUNTER — Other Ambulatory Visit: Payer: Self-pay | Admitting: Primary Care

## 2022-11-01 DIAGNOSIS — Z1231 Encounter for screening mammogram for malignant neoplasm of breast: Secondary | ICD-10-CM

## 2022-11-07 DIAGNOSIS — G4733 Obstructive sleep apnea (adult) (pediatric): Secondary | ICD-10-CM | POA: Diagnosis not present

## 2022-11-11 DIAGNOSIS — G4733 Obstructive sleep apnea (adult) (pediatric): Secondary | ICD-10-CM | POA: Diagnosis not present

## 2022-11-21 ENCOUNTER — Ambulatory Visit
Admission: RE | Admit: 2022-11-21 | Discharge: 2022-11-21 | Disposition: A | Discharge status: Home / Self Care | Payer: Medicare HMO | Source: Ambulatory Visit | Attending: Primary Care | Admitting: Primary Care

## 2022-11-21 DIAGNOSIS — Z1231 Encounter for screening mammogram for malignant neoplasm of breast: Secondary | ICD-10-CM | POA: Insufficient documentation

## 2022-12-08 DIAGNOSIS — G4733 Obstructive sleep apnea (adult) (pediatric): Secondary | ICD-10-CM | POA: Diagnosis not present

## 2022-12-11 DIAGNOSIS — G4733 Obstructive sleep apnea (adult) (pediatric): Secondary | ICD-10-CM | POA: Diagnosis not present

## 2023-01-07 DIAGNOSIS — G4733 Obstructive sleep apnea (adult) (pediatric): Secondary | ICD-10-CM | POA: Diagnosis not present

## 2023-01-10 ENCOUNTER — Other Ambulatory Visit: Payer: Self-pay

## 2023-01-10 DIAGNOSIS — L309 Dermatitis, unspecified: Secondary | ICD-10-CM

## 2023-01-10 MED ORDER — DUPIXENT 300 MG/2ML ~~LOC~~ SOAJ
SUBCUTANEOUS | 0 refills | Status: DC
Start: 2023-01-10 — End: 2023-04-02

## 2023-01-14 ENCOUNTER — Other Ambulatory Visit: Payer: Self-pay | Admitting: Dermatology

## 2023-01-14 DIAGNOSIS — L719 Rosacea, unspecified: Secondary | ICD-10-CM

## 2023-01-15 ENCOUNTER — Ambulatory Visit: Payer: Medicare HMO

## 2023-01-15 VITALS — Ht 63.0 in | Wt 215.0 lb

## 2023-01-15 DIAGNOSIS — Z0000 Encounter for general adult medical examination without abnormal findings: Secondary | ICD-10-CM | POA: Diagnosis not present

## 2023-01-15 NOTE — Patient Instructions (Signed)
Katherine Carpenter , Thank you for taking time to come for your Medicare Wellness Visit. I appreciate your ongoing commitment to your health goals. Please review the following plan we discussed and let me know if I can assist you in the future.   Referrals/Orders/Follow-Ups/Clinician Recommendations: Aim for 30 minutes of exercise or brisk walking, 6-8 glasses of water, and 5 servings of fruits and vegetables each day.   This is a list of the screening recommended for you and due dates:  Health Maintenance  Topic Date Due   COVID-19 Vaccine (4 - 2023-24 season) 02/10/2022   Medicare Annual Wellness Visit  10/12/2022   DTaP/Tdap/Td vaccine (2 - Td or Tdap) 11/29/2022   Flu Shot  01/11/2023   Pap Smear  07/22/2024   Mammogram  11/20/2024   Colon Cancer Screening  11/12/2031   Hepatitis C Screening  Completed   HIV Screening  Completed   Zoster (Shingles) Vaccine  Completed   HPV Vaccine  Aged Out    Advanced directives: (Declined) Advance directive discussed with you today. Even though you declined this today, please call our office should you change your mind, and we can give you the proper paperwork for you to fill out.  Next Medicare Annual Wellness Visit scheduled for next year: Yes  Preventive Care 40-64 Years, Female Preventive care refers to lifestyle choices and visits with your health care provider that can promote health and wellness. What does preventive care include? A yearly physical exam. This is also called an annual well check. Dental exams once or twice a year. Routine eye exams. Ask your health care provider how often you should have your eyes checked. Personal lifestyle choices, including: Daily care of your teeth and gums. Regular physical activity. Eating a healthy diet. Avoiding tobacco and drug use. Limiting alcohol use. Practicing safe sex. Taking low-dose aspirin daily starting at age 64. Taking vitamin and mineral supplements as recommended by your health care  provider. What happens during an annual well check? The services and screenings done by your health care provider during your annual well check will depend on your age, overall health, lifestyle risk factors, and family history of disease. Counseling  Your health care provider may ask you questions about your: Alcohol use. Tobacco use. Drug use. Emotional well-being. Home and relationship well-being. Sexual activity. Eating habits. Work and work Astron[employer]. Method of birth control. Menstrual cycle. Pregnancy history. Screening  You may have the following tests or measurements: Height, weight, and BMI. Blood pressure. Lipid and cholesterol levels. These may be checked every 5 years, or more frequently if you are over 50 years old. Skin check. Lung cancer screening. You may have this screening every year starting at age 60 if you have a 30-pack-year history of smoking and currently smoke or have quit within the past 15 years. Fecal occult blood test (FOBT) of the stool. You may have this test every year starting at age 52. Flexible sigmoidoscopy or colonoscopy. You may have a sigmoidoscopy every 5 years or a colonoscopy every 10 years starting at age 30. Hepatitis C blood test. Hepatitis B blood test. Sexually transmitted disease (STD) testing. Diabetes screening. This is done by checking your blood sugar (glucose) after you have not eaten for a while (fasting). You may have this done every 1-3 years. Mammogram. This may be done every 1-2 years. Talk to your health care provider about when you should start having regular mammograms. This may depend on whether you have a family history of breast  cancer. BRCA-related cancer screening. This may be done if you have a family history of breast, ovarian, tubal, or peritoneal cancers. Pelvic exam and Pap test. This may be done every 3 years starting at age 62. Starting at age 50, this may be done every 5 years if you have a Pap test in  combination with an HPV test. Bone density scan. This is done to screen for osteoporosis. You may have this scan if you are at high risk for osteoporosis. Discuss your test results, treatment options, and if necessary, the need for more tests with your health care provider. Vaccines  Your health care provider may recommend certain vaccines, such as: Influenza vaccine. This is recommended every year. Tetanus, diphtheria, and acellular pertussis (Tdap, Td) vaccine. You may need a Td booster every 10 years. Zoster vaccine. You may need this after age 65. Pneumococcal 13-valent conjugate (PCV13) vaccine. You may need this if you have certain conditions and were not previously vaccinated. Pneumococcal polysaccharide (PPSV23) vaccine. You may need one or two doses if you smoke cigarettes or if you have certain conditions. Talk to your health care provider about which screenings and vaccines you need and how often you need them. This information is not intended to replace advice given to you by your health care provider. Make sure you discuss any questions you have with your health care provider. Document Released: 06/25/2015 Document Revised: 02/16/2016 Document Reviewed: 03/30/2015 Elsevier Interactive Patient Education  2017 [employer].    Fall Prevention in the Home Falls can cause injuries. They can happen to people of all ages. There are many things you can do to make your home safe and to help prevent falls. What can I do on the outside of my home? Regularly fix the edges of walkways and driveways and fix any cracks. Remove anything that might make you trip as you walk through a door, such as a raised step or threshold. Trim any bushes or trees on the path to your home. Use bright outdoor lighting. Clear any walking paths of anything that might make someone trip, such as rocks or tools. Regularly check to see if handrails are loose or broken. Make sure that both sides of any steps have  handrails. Any raised decks and porches should have guardrails on the edges. Have any leaves, snow, or ice cleared regularly. Use sand or salt on walking paths during winter. Clean up any spills in your garage right away. This includes oil or grease spills. What can I do in the bathroom? Use night lights. Install grab bars by the toilet and in the tub and shower. Do not use towel bars as grab bars. Use non-skid mats or decals in the tub or shower. If you need to sit down in the shower, use a plastic, non-slip stool. Keep the floor dry. Clean up any water that spills on the floor as soon as it happens. Remove soap buildup in the tub or shower regularly. Attach bath mats securely with double-sided non-slip rug tape. Do not have throw rugs and other things on the floor that can make you trip. What can I do in the bedroom? Use night lights. Make sure that you have a light by your bed that is easy to reach. Do not use any sheets or blankets that are too big for your bed. They should not hang down onto the floor. Have a firm chair that has side arms. You can use this for support while you get dressed. Do not  have throw rugs and other things on the floor that can make you trip. What can I do in the kitchen? Clean up any spills right away. Avoid walking on wet floors. Keep items that you use a lot in easy-to-reach places. If you need to reach something above you, use a strong step stool that has a grab bar. Keep electrical cords out of the way. Do not use floor polish or wax that makes floors slippery. If you must use wax, use non-skid floor wax. Do not have throw rugs and other things on the floor that can make you trip. What can I do with my stairs? Do not leave any items on the stairs. Make sure that there are handrails on both sides of the stairs and use them. Fix handrails that are broken or loose. Make sure that handrails are as long as the stairways. Check any carpeting to make sure  that it is firmly attached to the stairs. Fix any carpet that is loose or worn. Avoid having throw rugs at the top or bottom of the stairs. If you do have throw rugs, attach them to the floor with carpet tape. Make sure that you have a light switch at the top of the stairs and the bottom of the stairs. If you do not have them, ask someone to add them for you. What else can I do to help prevent falls? Wear shoes that: Do not have high heels. Have rubber bottoms. Are comfortable and fit you well. Are closed at the toe. Do not wear sandals. If you use a stepladder: Make sure that it is fully opened. Do not climb a closed stepladder. Make sure that both sides of the stepladder are locked into place. Ask someone to hold it for you, if possible. Clearly mark and make sure that you can see: Any grab bars or handrails. First and last steps. Where the edge of each step is. Use tools that help you move around (mobility aids) if they are needed. These include: Canes. Walkers. Scooters. Crutches. Turn on the lights when you go into a dark area. Replace any light bulbs as soon as they burn out. Set up your furniture so you have a clear path. Avoid moving your furniture around. If any of your floors are uneven, fix them. If there are any pets around you, be aware of where they are. Review your medicines with your doctor. Some medicines can make you feel dizzy. This can increase your chance of falling. Ask your doctor what other things that you can do to help prevent falls. This information is not intended to replace advice given to you by your health care provider. Make sure you discuss any questions you have with your health care provider. Document Released: 03/25/2009 Document Revised: 11/04/2015 Document Reviewed: 07/03/2014 Elsevier Interactive Patient Education  2017 [employer].

## 2023-01-15 NOTE — Progress Notes (Signed)
Subjective:   Katherine Carpenter is a 62 y.o. female who presents for Medicare Annual (Subsequent) preventive examination.  Visit Complete: Virtual  I connected with  Katherine Carpenter on 01/15/23 by a audio enabled telemedicine application and verified that I am speaking with the correct person using two identifiers.  Patient Location: Home  Provider Location: Office/Clinic  I discussed the limitations of evaluation and management by telemedicine. The patient expressed understanding and agreed to proceed.   Vital Signs: Unable to obtain new vitals due to this being a telehealth visit.   Review of Systems      Cardiac Risk Factors include: advanced age (>30men, >72 women);hypertension;dyslipidemia;obesity (BMI >30kg/m2);sedentary lifestyle     Objective:    Today's Vitals   01/15/23 0950  Weight: 215 lb (97.5 kg)  Height: 5\' 3"  (1.6 m)   Body mass index is 38.09 kg/m.     01/15/2023   10:03 AM 09/25/2022   10:30 AM 11/11/2021    7:55 AM 10/11/2021    8:32 AM 12/23/2020   12:05 PM 07/02/2020    3:54 PM 07/02/2020    3:30 PM  Advanced Directives  Does Patient Have a Medical Advance Directive? No No No No No No No  Would patient like information on creating a medical advance directive? No - Patient declined No - Patient declined  No - Patient declined No - Patient declined No - Patient declined No - Patient declined    Current Medications (verified) Outpatient Encounter Medications as of 01/15/2023  Medication Sig   ALPRAZolam (XANAX) 1 MG tablet Take 1 mg by mouth 5 (five) times daily. Patient may take up to 5 times a day / one in am and 2 in evening   amphetamine-dextroamphetamine (ADDERALL XR) 30 MG 24 hr capsule Take 30 mg by mouth every morning.   buPROPion (WELLBUTRIN XL) 300 MG 24 hr tablet Take 300 mg by mouth every morning.   Calcium Carbonate-Vitamin D 500-125 MG-UNIT TABS Take by mouth in the morning and at bedtime.   carbamazepine (TEGRETOL) 200 MG tablet TAKE ONE  TABLET BY MOUTH EVERY MORNING AND THREE TABLETS AT BEDTIME   cetirizine (ZYRTEC) 10 MG tablet Take 10 mg by mouth daily.   Cholecalciferol (VITAMIN D3) 50 MCG (2000 UT) capsule Take 2,000 Units by mouth daily.   doxycycline (PERIOSTAT) 20 MG tablet Take 1 tablet (20 mg total) by mouth 2 (two) times daily. With food (Patient taking differently: Take 20 mg by mouth daily. With food/ one in evening)   Dupilumab (DUPIXENT) 300 MG/2ML SOPN INJECT 1 PEN (300MG ) UNDER THE SKIN (SUBCUTANEOUS INJECTION) EVERY 14 DAYS   FLUoxetine (PROZAC) 40 MG capsule Take 40 mg by mouth at bedtime.   lamoTRIgine (LAMICTAL) 200 MG tablet 2 (two) times daily.   metoprolol succinate (TOPROL-XL) 25 MG 24 hr tablet TAKE 1/2 (ONE-HALF) TABLET BY MOUTH ONCE DAILY FOR  HEARTRATE  AND  BLOOD  PRESSURE   Multiple Vitamin (MULTI-VITAMINS) TABS Take by mouth in the morning and at bedtime.   OLANZapine (ZYPREXA) 10 MG tablet Take 10 mg by mouth at bedtime.   olanzapine-FLUoxetine (SYMBYAX) 12-25 MG per capsule Take 1 capsule by mouth every evening.   omeprazole (PRILOSEC) 40 MG capsule Take 1 capsule (40 mg total) by mouth daily. For heartburn. Office visit required for further refills.   Probiotic Product (PROBIOTIC DAILY PO) Take by mouth. Takes 2 QD   simvastatin (ZOCOR) 10 MG tablet TAKE 1 TABLET BY MOUTH EVERY DAY FOR CHOLESTEROL  metroNIDAZOLE (METROGEL) 0.75 % gel APPLY APPLICATION TOPICALLY TWO (2) TIMES DAILY. (Patient not taking: Reported on 01/15/2023)   No facility-administered encounter medications on file as of 01/15/2023.    Allergies (verified) Codeine   History: Past Medical History:  Diagnosis Date   Arthritis    osteo   Bipolar 1 disorder (HCC)    Depression    GERD (gastroesophageal reflux disease)    Hypertension    PONV (postoperative nausea and vomiting)    Sleep apnea    CPAP   Past Surgical History:  Procedure Laterality Date   CATARACT EXTRACTION W/PHACO Right 09/11/2022   Procedure: CATARACT  EXTRACTION PHACO AND INTRAOCULAR LENS PLACEMENT (IOC) RIGHT  2.97  00:21.1;  Surgeon: Nevada Crane, MD;  Location: [facility];  Service: Ophthalmology;  Laterality: Right;   CATARACT EXTRACTION W/PHACO Left 09/25/2022   Procedure: CATARACT EXTRACTION PHACO AND INTRAOCULAR LENS PLACEMENT (IOC) LEFT  5.35  00:34.4;  Surgeon: Nevada Crane, MD;  Location: [facility];  Service: Ophthalmology;  Laterality: Left;   CERVICAL BIOPSY  W/ LOOP ELECTRODE EXCISION  2016   COLONOSCOPY WITH PROPOFOL N/A 11/11/2021   Procedure: COLONOSCOPY WITH PROPOFOL;  Surgeon: Toney Reil, MD;  Location: [facility] ENDOSCOPY;  Service: Gastroenterology;  Laterality: N/A;   ESOPHAGOGASTRODUODENOSCOPY (EGD) WITH PROPOFOL N/A 10/27/2019   Procedure: ESOPHAGOGASTRODUODENOSCOPY (EGD) WITH PROPOFOL;  Surgeon: Pasty Spillers, MD;  Location: [facility] ENDOSCOPY;  Service: Endoscopy;  Laterality: N/A;   TUBAL LIGATION     Family History  Problem Relation Age of Onset   Asthma Mother    Depression Daughter    Arthritis Maternal Grandmother    Cancer Maternal Grandmother        lung   Cancer Paternal Grandmother    Leukemia Paternal Grandmother    Breast cancer Neg Hx    Social History   Socioeconomic History   Marital status: Married    Spouse name: Not on file   Number of children: Not on file   Years of education: Not on file   Highest education level: Not on file  Occupational History   Not on file  Tobacco Use   Smoking status: Never   Smokeless tobacco: Never  Vaping Use   Vaping status: Never Used  Substance and Sexual Activity   Alcohol use: No    Comment: Pt denies   Drug use: No    Comment: Pt denies   Sexual activity: Not on file  Other Topics Concern   Not on file  Social History Narrative   Not on file   Social Determinants of Health   Financial Resource Strain: Low Risk  (01/15/2023)   Overall Financial Resource Strain (CARDIA)    Difficulty of Paying Living  Expenses: Not hard at all  Food Insecurity: No Food Insecurity (01/15/2023)   Hunger Vital Sign    Worried About Running Out of Food in the Last Year: Never true    Ran Out of Food in the Last Year: Never true  Transportation Needs: No Transportation Needs (01/15/2023)   PRAPARE - Administrator, Civil Service (Medical): No    Lack of Transportation (Non-Medical): No  Physical Activity: Insufficiently Active (01/15/2023)   Exercise Vital Sign    Days of Exercise per Week: 3 days    Minutes of Exercise per Session: 20 min  Stress: No Stress Concern Present (01/15/2023)   [employer] of Occupational Health - Occupational Stress Questionnaire    Feeling of Stress :  Not at all  Social Connections: Moderately Integrated (01/15/2023)   Social Connection and Isolation Panel [NHANES]    Frequency of Communication with Friends and Family: More than three times a week    Frequency of Social Gatherings with Friends and Family: More than three times a week    Attends Religious Services: More than 4 times per year    Active Member of [employer] or Organizations: No    Attends Engineer, structural: Never    Marital Status: Married    Tobacco Counseling Counseling given: Not Answered   Clinical Intake:  Pre-visit preparation completed: Yes  Pain : No/denies pain     BMI - recorded: 38.09 Nutritional Risks: None Diabetes: No  How often do you need to have someone help you when you read instructions, pamphlets, or other written materials from your doctor or pharmacy?: 1 - Never  Interpreter Needed?: No  Information entered by :: C.Brnadon Eoff LPN   Activities of Daily Living    01/15/2023   10:04 AM 09/25/2022   10:27 AM  In your present state of health, do you have any difficulty performing the following activities:  Hearing? 0 0  Vision? 0 0  Difficulty concentrating or making decisions? 0 0  Walking or climbing stairs? 0 0  Dressing or bathing? 0 0  Doing errands,  shopping? 0   Preparing Food and eating ? N   Using the Toilet? N   In the past six months, have you accidently leaked urine? N   Do you have problems with loss of bowel control? N   Managing your Medications? N   Managing your Finances? N   Housekeeping or managing your Housekeeping? N     Patient Care Team: Doreene Nest, NP as PCP - General (Internal Medicine) Lonell Face, MD as Consulting Physician (Neurology)  Indicate any recent Medical Services you may have received from other than [facility] providers in the past year (date may be approximate).     Assessment:   This is a routine wellness examination for [place].  Hearing/Vision screen Hearing Screening - Comments:: Denies hearing difficulties   Vision Screening - Comments:: Readers - had cataracts removed - Dr.King - UTD on eye exams  Dietary issues and exercise activities discussed:     Goals Addressed             This Visit's Progress    Patient Stated       Lose 50-60 pounds       Depression Screen    01/15/2023   10:03 AM 07/25/2022    7:54 AM 10/11/2021    8:41 AM 07/02/2020    3:55 PM 05/13/2018    8:45 AM  PHQ 2/9 Scores  PHQ - 2 Score 0 0 0 0 0  PHQ- 9 Score    0     Fall Risk    01/15/2023   10:04 AM 07/25/2022    7:54 AM 10/11/2021    8:32 AM 07/02/2020    3:31 PM 05/13/2018    8:45 AM  Fall Risk   Falls in the past year? 0 0 0 1 0  Number falls in past yr: 0 0 0 1 0  Injury with Fall? 0 0 0 0   Risk for fall due to : No Fall Risks No Fall Risks  Medication side effect   Follow up Falls prevention discussed;Falls evaluation completed Falls evaluation completed Falls evaluation completed;Education provided;Falls prevention discussed Falls evaluation completed;Falls prevention discussed  MEDICARE RISK AT HOME:  Medicare Risk at Home - 01/15/23 1005     Any stairs in or around the home? No    If so, are there any without handrails? No    Home free of loose throw rugs in walkways, pet  beds, electrical cords, etc? Yes    Adequate lighting in your home to reduce risk of falls? Yes    Life alert? No    Use of a cane, walker or w/c? No    Grab bars in the bathroom? Yes    Shower chair or bench in shower? Yes    Elevated toilet seat or a handicapped toilet? No             TIMED UP AND GO:  Was the test performed?  No    Cognitive Function:    07/02/2020    3:57 PM  MMSE - Mini Mental State Exam  Orientation to time 5  Orientation to Place 5  Registration 3  Attention/ Calculation 5  Recall 3  Language- repeat 1        01/15/2023   10:05 AM 10/11/2021    8:33 AM  6CIT Screen  What Year? 0 points 0 points  What month? 0 points 0 points  What time? 0 points 0 points  Count back from 20 2 points 0 points  Months in reverse 2 points 2 points  Repeat phrase 2 points 0 points  Total Score 6 points 2 points    Immunizations Immunization History  Administered Date(s) Administered   Influenza Inj Mdck Quad Pf 03/07/2018   Influenza,inj,Quad PF,6+ Mos 02/01/2019   Influenza-Unspecified 01/30/2017, 03/11/2020, 02/24/2021, 02/25/2022   PFIZER(Purple Top)SARS-COV-2 Vaccination 08/29/2019, 09/23/2019, 12/10/2019   Tdap 11/28/2012   Zoster Recombinant(Shingrix) 12/03/2017, 04/07/2018    TDAP status: Due, Education has been provided regarding the importance of this vaccine. Advised may receive this vaccine at local pharmacy or Health Dept. Aware to provide a copy of the vaccination record if obtained from local pharmacy or Health Dept. Verbalized acceptance and understanding.  Flu Vaccine status: Due, Education has been provided regarding the importance of this vaccine. Advised may receive this vaccine at local pharmacy or Health Dept. Aware to provide a copy of the vaccination record if obtained from local pharmacy or Health Dept. Verbalized acceptance and understanding.  Pneumococcal vaccine status: Due, Education has been provided regarding the importance of  this vaccine. Advised may receive this vaccine at local pharmacy or Health Dept. Aware to provide a copy of the vaccination record if obtained from local pharmacy or Health Dept. Verbalized acceptance and understanding.  Covid-19 vaccine status: Information provided on how to obtain vaccines.   Qualifies for Shingles Vaccine? Yes   Zostavax completed No   Shingrix Completed?: Yes  Screening Tests Health Maintenance  Topic Date Due   COVID-19 Vaccine (4 - 2023-24 season) 02/10/2022   DTaP/Tdap/Td (2 - Td or Tdap) 11/29/2022   INFLUENZA VACCINE  01/11/2023   Medicare Annual Wellness (AWV)  01/15/2024   PAP SMEAR-Modifier  07/22/2024   MAMMOGRAM  11/20/2024   Colonoscopy  11/12/2031   Hepatitis C Screening  Completed   HIV Screening  Completed   Zoster Vaccines- Shingrix  Completed   HPV VACCINES  Aged Out    Health Maintenance  Health Maintenance Due  Topic Date Due   COVID-19 Vaccine (4 - 2023-24 season) 02/10/2022   DTaP/Tdap/Td (2 - Td or Tdap) 11/29/2022   INFLUENZA VACCINE  01/11/2023    Colorectal  cancer screening: Type of screening: Colonoscopy. Completed 11/11/21. Repeat every 10 years  Mammogram status: Completed 11/21/22. Repeat every year   Lung Cancer Screening: (Low Dose CT Chest recommended if Age 10-80 years, 20 pack-year currently smoking OR have quit w/in 15years.) does not qualify.   Lung Cancer Screening Referral: no  Additional Screening:  Hepatitis C Screening: does qualify; Completed 05/06/18  Vision Screening: Recommended annual ophthalmology exams for early detection of glaucoma and other disorders of the eye. Is the patient up to date with their annual eye exam?  Yes  Who is the provider or what is the name of the office in which the patient attends annual eye exams? Dr.King If pt is not established with a provider, would they like to be referred to a provider to establish care? Yes .   Dental Screening: Recommended annual dental exams for  proper oral hygiene    Community Resource Referral / Chronic Care Management: CRR required this visit?  No   CCM required this visit?  No     Plan:     I have personally reviewed and noted the following in the patient's chart:   Medical and social history Use of alcohol, tobacco or illicit drugs  Current medications and supplements including opioid prescriptions. Patient is not currently taking opioid prescriptions. Functional ability and status Nutritional status Physical activity Advanced directives List of other physicians Hospitalizations, surgeries, and ER visits in previous 12 months Vitals Screenings to include cognitive, depression, and falls Referrals and appointments  In addition, I have reviewed and discussed with patient certain preventive protocols, quality metrics, and best practice recommendations. A written personalized care plan for preventive services as well as general preventive health recommendations were provided to patient.     Maryan Puls, LPN   0/02/8118   After Visit Summary: (MyChart) Due to this being a telephonic visit, the after visit summary with patients personalized plan was offered to patient via MyChart   Nurse Notes: none

## 2023-02-03 DIAGNOSIS — H524 Presbyopia: Secondary | ICD-10-CM | POA: Diagnosis not present

## 2023-02-05 DIAGNOSIS — G4733 Obstructive sleep apnea (adult) (pediatric): Secondary | ICD-10-CM | POA: Diagnosis not present

## 2023-03-08 DIAGNOSIS — G4733 Obstructive sleep apnea (adult) (pediatric): Secondary | ICD-10-CM | POA: Diagnosis not present

## 2023-03-20 NOTE — Telephone Encounter (Signed)
Noted  

## 2023-03-21 ENCOUNTER — Ambulatory Visit (INDEPENDENT_AMBULATORY_CARE_PROVIDER_SITE_OTHER): Payer: Medicare HMO | Admitting: Primary Care

## 2023-03-21 ENCOUNTER — Encounter: Payer: Self-pay | Admitting: Primary Care

## 2023-03-21 ENCOUNTER — Ambulatory Visit (INDEPENDENT_AMBULATORY_CARE_PROVIDER_SITE_OTHER)
Admission: RE | Admit: 2023-03-21 | Discharge: 2023-03-21 | Disposition: A | Discharge status: Home / Self Care | Payer: Medicare HMO | Source: Ambulatory Visit | Attending: Primary Care

## 2023-03-21 VITALS — BP 146/82 | HR 90 | Temp 98.1°F | Ht 63.0 in | Wt 226.0 lb

## 2023-03-21 DIAGNOSIS — M79671 Pain in right foot: Secondary | ICD-10-CM

## 2023-03-21 DIAGNOSIS — M2011 Hallux valgus (acquired), right foot: Secondary | ICD-10-CM | POA: Diagnosis not present

## 2023-03-21 DIAGNOSIS — W19XXXA Unspecified fall, initial encounter: Secondary | ICD-10-CM | POA: Insufficient documentation

## 2023-03-21 NOTE — Patient Instructions (Signed)
Please go to x-ray.  I will be in touch with results.  It was a pleasure to see you today!

## 2023-03-21 NOTE — Assessment & Plan Note (Signed)
Status post fall yesterday.  Given trauma, will obtain plain films today. Limit weightbearing activity for now.

## 2023-03-21 NOTE — Assessment & Plan Note (Signed)
Superficial integumentary injuries appear minor overall.  No lacerations or need for further intervention.  Checking plain films of the right foot today.

## 2023-03-21 NOTE — Progress Notes (Signed)
Subjective:    Patient ID: Katherine Carpenter, female    DOB: 10-Jan-1961, 62 y.o.   MRN: [id number]  Katherine Carpenter is a very pleasant 62 y.o. female with a history of hypertension, hyperlipidemia, bipolar disorder, balance problem, fatigue, prediabetes who presents today to discuss injury sustained from a fall.  Her husband joins [place] today.  Her fall occurred yesterday while walking out of the door to her house. She was carrying two bags in her hands, was walking down her front deck stairs, her foot rolled. She fell onto her right side.  She did hit her head on the soft ground.  Injuries include abrasions to the right lower extremity, forearm and elbow, and right sided foot pain. Her right foot pain is located to the right lateral and dorsal foot mostly to the 3-5th metatarsal bones. She has noticed mild swelling. She has a moderate amount of pain by placing any sort of pressure on her right foot.  She was unable to drive herself today.  She denies color changes, ankle pain, ankle swelling, headaches, dizziness, visual changes.   Review of Systems  Musculoskeletal:  Positive for arthralgias.  Skin:  Positive for wound. Negative for color change.         Past Medical History:  Diagnosis Date   Arthritis    osteo   Bipolar 1 disorder (HCC)    Depression    GERD (gastroesophageal reflux disease)    Hypertension    PONV (postoperative nausea and vomiting)    Sleep apnea    CPAP    Social History   Socioeconomic History   Marital status: Married    Spouse name: Not on file   Number of children: Not on file   Years of education: Not on file   Highest education level: Associate degree: occupational, Scientist, product/process development, or vocational program  Occupational History   Not on file  Tobacco Use   Smoking status: Never   Smokeless tobacco: Never  Vaping Use   Vaping status: Never Used  Substance and Sexual Activity   Alcohol use: No    Comment: Pt denies   Drug use: No     Comment: Pt denies   Sexual activity: Not on file  Other Topics Concern   Not on file  Social History Narrative   Not on file   Social Determinants of Health   Financial Resource Strain: Low Risk  (03/20/2023)   Overall Financial Resource Strain (CARDIA)    Difficulty of Paying Living Expenses: Not hard at all  Food Insecurity: No Food Insecurity (03/20/2023)   Hunger Vital Sign    Worried About Running Out of Food in the Last Year: Never true    Ran Out of Food in the Last Year: Never true  Transportation Needs: No Transportation Needs (03/20/2023)   PRAPARE - Administrator, Civil Service (Medical): No    Lack of Transportation (Non-Medical): No  Physical Activity: Sufficiently Active (03/20/2023)   Exercise Vital Sign    Days of Exercise per Week: 5 days    Minutes of Exercise per Session: 60 min  Recent Concern: Physical Activity - Insufficiently Active (01/15/2023)   Exercise Vital Sign    Days of Exercise per Week: 3 days    Minutes of Exercise per Session: 20 min  Stress: No Stress Concern Present (03/20/2023)   [employer] of Occupational Health - Occupational Stress Questionnaire    Feeling of Stress : Not at all  Social Connections: Socially Integrated (03/20/2023)   Social Connection and Isolation Panel [NHANES]    Frequency of Communication with Friends and Family: More than three times a week    Frequency of Social Gatherings with Friends and Family: More than three times a week    Attends Religious Services: More than 4 times per year    Active Member of [employer] or Organizations: Yes    Attends Banker Meetings: More than 4 times per year    Marital Status: Married  Catering manager Violence: Not At Risk (01/15/2023)   Humiliation, Afraid, Rape, and Kick questionnaire    Fear of Current or Ex-Partner: No    Emotionally Abused: No    Physically Abused: No    Sexually Abused: No    Past Surgical History:  Procedure Laterality Date    CATARACT EXTRACTION W/PHACO Right 09/11/2022   Procedure: CATARACT EXTRACTION PHACO AND INTRAOCULAR LENS PLACEMENT (IOC) RIGHT  2.97  00:21.1;  Surgeon: Nevada Crane, MD;  Location: [facility];  Service: Ophthalmology;  Laterality: Right;   CATARACT EXTRACTION W/PHACO Left 09/25/2022   Procedure: CATARACT EXTRACTION PHACO AND INTRAOCULAR LENS PLACEMENT (IOC) LEFT  5.35  00:34.4;  Surgeon: Nevada Crane, MD;  Location: [facility];  Service: Ophthalmology;  Laterality: Left;   CERVICAL BIOPSY  W/ LOOP ELECTRODE EXCISION  2016   COLONOSCOPY WITH PROPOFOL N/A 11/11/2021   Procedure: COLONOSCOPY WITH PROPOFOL;  Surgeon: Toney Reil, MD;  Location: [facility] ENDOSCOPY;  Service: Gastroenterology;  Laterality: N/A;   ESOPHAGOGASTRODUODENOSCOPY (EGD) WITH PROPOFOL N/A 10/27/2019   Procedure: ESOPHAGOGASTRODUODENOSCOPY (EGD) WITH PROPOFOL;  Surgeon: Pasty Spillers, MD;  Location: [facility] ENDOSCOPY;  Service: Endoscopy;  Laterality: N/A;   TUBAL LIGATION      Family History  Problem Relation Age of Onset   Asthma Mother    Depression Daughter    Arthritis Maternal Grandmother    Cancer Maternal Grandmother        lung   Cancer Paternal Grandmother    Leukemia Paternal Grandmother    Breast cancer Neg Hx     Allergies  Allergen Reactions   Codeine Other (See Comments)    Current Outpatient Medications on File Prior to Visit  Medication Sig Dispense Refill   ALPRAZolam (XANAX) 1 MG tablet Take 1 mg by mouth 5 (five) times daily. Patient may take up to 5 times a day / one in am and 2 in evening     amphetamine-dextroamphetamine (ADDERALL XR) 30 MG 24 hr capsule Take 30 mg by mouth every morning.     buPROPion (WELLBUTRIN XL) 300 MG 24 hr tablet Take 300 mg by mouth every morning.     Calcium Carbonate-Vitamin D 500-125 MG-UNIT TABS Take by mouth in the morning and at bedtime.     carbamazepine (TEGRETOL) 200 MG tablet TAKE ONE TABLET BY MOUTH EVERY MORNING AND THREE  TABLETS AT BEDTIME     cetirizine (ZYRTEC) 10 MG tablet Take 10 mg by mouth daily.     Cholecalciferol (VITAMIN D3) 50 MCG (2000 UT) capsule Take 2,000 Units by mouth daily.     doxycycline (PERIOSTAT) 20 MG tablet Take 1 tablet by mouth twice daily with food 60 tablet 2   Dupilumab (DUPIXENT) 300 MG/2ML SOPN INJECT 1 PEN (300MG ) UNDER THE SKIN (SUBCUTANEOUS INJECTION) EVERY 14 DAYS 12 mL 0   FLUoxetine (PROZAC) 40 MG capsule Take 40 mg by mouth at bedtime.     lamoTRIgine (LAMICTAL) 200 MG tablet 2 (two)  times daily.     metoprolol succinate (TOPROL-XL) 25 MG 24 hr tablet TAKE 1/2 (ONE-HALF) TABLET BY MOUTH ONCE DAILY FOR  HEARTRATE  AND  BLOOD  PRESSURE 45 tablet 2   metroNIDAZOLE (METROGEL) 0.75 % gel APPLY APPLICATION TOPICALLY TWO (2) TIMES DAILY. 45 g 6   Multiple Vitamin (MULTI-VITAMINS) TABS Take by mouth in the morning and at bedtime.     OLANZapine (ZYPREXA) 10 MG tablet Take 10 mg by mouth at bedtime.     olanzapine-FLUoxetine (SYMBYAX) 12-25 MG per capsule Take 1 capsule by mouth every evening.     omeprazole (PRILOSEC) 40 MG capsule Take 1 capsule (40 mg total) by mouth daily. For heartburn. Office visit required for further refills. 90 capsule 0   Probiotic Product (PROBIOTIC DAILY PO) Take by mouth. Takes 2 QD     simvastatin (ZOCOR) 10 MG tablet TAKE 1 TABLET BY MOUTH EVERY DAY FOR CHOLESTEROL 90 tablet 3   No current facility-administered medications on file prior to visit.    BP (!) 146/82   Pulse 90   Temp 98.1 F (36.7 C) (Temporal)   Ht 5\' 3"  (1.6 m)   Wt 226 lb (102.5 kg)   LMP 09/08/2011   SpO2 99%   BMI 40.03 kg/m  Objective:   Physical Exam Cardiovascular:     Rate and Rhythm: Normal rate and regular rhythm.  Musculoskeletal:     Right ankle: No swelling or deformity. Normal range of motion.     Right foot: Decreased range of motion. Swelling, tenderness and bony tenderness present. No deformity. Normal pulse.       Feet:     Comments: Decrease in range  of motion due to pain with plantar and dorsiflexion of right foot  Skin:    General: Skin is warm and dry.     Findings: No erythema.     Comments: Abrasions to right lower extremity distal to knee and right posterior forearm distal to elbow.            Assessment & Plan:  Acute foot pain, right Assessment & Plan: Status post fall yesterday.  Given trauma, will obtain plain films today. Limit weightbearing activity for now.  Orders: -     DG Foot Complete Right  Fall, initial encounter Assessment & Plan: Superficial integumentary injuries appear minor overall.  No lacerations or need for further intervention.  Checking plain films of the right foot today.  Orders: -     DG Foot Complete Right        Doreene Nest, NP

## 2023-04-02 ENCOUNTER — Other Ambulatory Visit: Payer: Self-pay

## 2023-04-02 DIAGNOSIS — L309 Dermatitis, unspecified: Secondary | ICD-10-CM

## 2023-04-02 MED ORDER — DUPIXENT 300 MG/2ML ~~LOC~~ SOAJ
SUBCUTANEOUS | 0 refills | Status: DC
Start: 2023-04-02 — End: 2023-05-23

## 2023-04-07 DIAGNOSIS — G4733 Obstructive sleep apnea (adult) (pediatric): Secondary | ICD-10-CM | POA: Diagnosis not present

## 2023-04-08 ENCOUNTER — Other Ambulatory Visit: Payer: Self-pay | Admitting: Dermatology

## 2023-04-08 DIAGNOSIS — L719 Rosacea, unspecified: Secondary | ICD-10-CM

## 2023-04-09 NOTE — Telephone Encounter (Signed)
Noted, will evaluate as scheduled.  

## 2023-04-10 ENCOUNTER — Ambulatory Visit (INDEPENDENT_AMBULATORY_CARE_PROVIDER_SITE_OTHER): Payer: Medicare HMO | Admitting: Primary Care

## 2023-04-10 ENCOUNTER — Encounter: Payer: Self-pay | Admitting: Primary Care

## 2023-04-10 VITALS — BP 126/84 | HR 88 | Temp 97.4°F | Ht 63.0 in | Wt 226.0 lb

## 2023-04-10 DIAGNOSIS — H02843 Edema of right eye, unspecified eyelid: Secondary | ICD-10-CM | POA: Diagnosis not present

## 2023-04-10 DIAGNOSIS — H0289 Other specified disorders of eyelid: Secondary | ICD-10-CM | POA: Diagnosis not present

## 2023-04-10 MED ORDER — ERYTHROMYCIN 5 MG/GM OP OINT: 1.0000 | TOPICAL_OINTMENT | Freq: Every day | OPHTHALMIC | 0 refills | Status: DC

## 2023-04-10 NOTE — Progress Notes (Signed)
Subjective:    Patient ID: Katherine Carpenter, female    DOB: 1961/04/30, 62 y.o.   MRN: [id number]  HPI  Katherine Carpenter is a very pleasant 62 y.o. female with a history of hypertension, eczema, hyperlipidemia, prediabetes who presents today to discuss eyelid swelling and tenderness.  The eyelid swelling and tenderness is located to her right upper eyelid for which she first noticed two days ago.   She has been using "refresh" eyedrops for which she had leftover from her cataract surgery which helps to keep her eyes moist.  She denies visual changes, conjunctival injection, eyelid masses, discharge, sneezing, runny nose. She has noticed itching to the top of her eyelid. She is compliant to Zyrtec daily.  Her swelling and tenderness have improved slightly.    Review of Systems  Constitutional:  Negative for fever.  HENT:  Negative for congestion and rhinorrhea.   Eyes:  Negative for pain, discharge, redness, itching and visual disturbance.       Right upper eyelid swelling and tenderness.          Past Medical History:  Diagnosis Date   Arthritis    osteo   Bipolar 1 disorder (HCC)    Depression    GERD (gastroesophageal reflux disease)    Hypertension    PONV (postoperative nausea and vomiting)    Sleep apnea    CPAP    Social History   Socioeconomic History   Marital status: Married    Spouse name: Not on file   Number of children: Not on file   Years of education: Not on file   Highest education level: Associate degree: occupational, Scientist, product/process development, or vocational program  Occupational History   Not on file  Tobacco Use   Smoking status: Never   Smokeless tobacco: Never  Vaping Use   Vaping status: Never Used  Substance and Sexual Activity   Alcohol use: No    Comment: Pt denies   Drug use: No    Comment: Pt denies   Sexual activity: Not on file  Other Topics Concern   Not on file  Social History Narrative   Not on file   Social Determinants of Health    Financial Resource Strain: Low Risk  (03/20/2023)   Overall Financial Resource Strain (CARDIA)    Difficulty of Paying Living Expenses: Not hard at all  Food Insecurity: No Food Insecurity (03/20/2023)   Hunger Vital Sign    Worried About Running Out of Food in the Last Year: Never true    Ran Out of Food in the Last Year: Never true  Transportation Needs: No Transportation Needs (03/20/2023)   PRAPARE - Administrator, Civil Service (Medical): No    Lack of Transportation (Non-Medical): No  Physical Activity: Sufficiently Active (03/20/2023)   Exercise Vital Sign    Days of Exercise per Week: 5 days    Minutes of Exercise per Session: 60 min  Recent Concern: Physical Activity - Insufficiently Active (01/15/2023)   Exercise Vital Sign    Days of Exercise per Week: 3 days    Minutes of Exercise per Session: 20 min  Stress: No Stress Concern Present (03/20/2023)   [employer] of Occupational Health - Occupational Stress Questionnaire    Feeling of Stress : Not at all  Social Connections: Socially Integrated (03/20/2023)   Social Connection and Isolation Panel [NHANES]    Frequency of Communication with Friends and Family: More than three times a week  Frequency of Social Gatherings with Friends and Family: More than three times a week    Attends Religious Services: More than 4 times per year    Active Member of Clubs or Organizations: Yes    Attends Banker Meetings: More than 4 times per year    Marital Status: Married  Catering manager Violence: Not At Risk (01/15/2023)   Humiliation, Afraid, Rape, and Kick questionnaire    Fear of Current or Ex-Partner: No    Emotionally Abused: No    Physically Abused: No    Sexually Abused: No    Past Surgical History:  Procedure Laterality Date   CATARACT EXTRACTION W/PHACO Right 09/11/2022   Procedure: CATARACT EXTRACTION PHACO AND INTRAOCULAR LENS PLACEMENT (IOC) RIGHT  2.97  00:21.1;  Surgeon: Nevada Crane, MD;  Location: [facility];  Service: Ophthalmology;  Laterality: Right;   CATARACT EXTRACTION W/PHACO Left 09/25/2022   Procedure: CATARACT EXTRACTION PHACO AND INTRAOCULAR LENS PLACEMENT (IOC) LEFT  5.35  00:34.4;  Surgeon: Nevada Crane, MD;  Location: [facility];  Service: Ophthalmology;  Laterality: Left;   CERVICAL BIOPSY  W/ LOOP ELECTRODE EXCISION  2016   COLONOSCOPY WITH PROPOFOL N/A 11/11/2021   Procedure: COLONOSCOPY WITH PROPOFOL;  Surgeon: Toney Reil, MD;  Location: [facility];  Service: Gastroenterology;  Laterality: N/A;   ESOPHAGOGASTRODUODENOSCOPY (EGD) WITH PROPOFOL N/A 10/27/2019   Procedure: ESOPHAGOGASTRODUODENOSCOPY (EGD) WITH PROPOFOL;  Surgeon: Pasty Spillers, MD;  Location: [facility] ENDOSCOPY;  Service: Endoscopy;  Laterality: N/A;   TUBAL LIGATION      Family History  Problem Relation Age of Onset   Asthma Mother    Depression Daughter    Arthritis Maternal Grandmother    Cancer Maternal Grandmother        lung   Cancer Paternal Grandmother    Leukemia Paternal Grandmother    Breast cancer Neg Hx     Allergies  Allergen Reactions   Codeine Other (See Comments)    Current Outpatient Medications on File Prior to Visit  Medication Sig Dispense Refill   ALPRAZolam (XANAX) 1 MG tablet Take 1 mg by mouth 5 (five) times daily. Patient may take up to 5 times a day / one in am and 2 in evening     amphetamine-dextroamphetamine (ADDERALL XR) 30 MG 24 hr capsule Take 30 mg by mouth every morning.     buPROPion (WELLBUTRIN XL) 300 MG 24 hr tablet Take 300 mg by mouth every morning.     Calcium Carbonate-Vitamin D 500-125 MG-UNIT TABS Take by mouth in the morning and at bedtime.     carbamazepine (TEGRETOL) 200 MG tablet TAKE ONE TABLET BY MOUTH EVERY MORNING AND THREE TABLETS AT BEDTIME     cetirizine (ZYRTEC) 10 MG tablet Take 10 mg by mouth daily.     Cholecalciferol (VITAMIN D3) 50 MCG (2000 UT) capsule Take 2,000 Units by  mouth daily.     doxycycline (PERIOSTAT) 20 MG tablet Take 1 tablet by mouth twice daily with food 60 tablet 3   Dupilumab (DUPIXENT) 300 MG/2ML SOAJ INJECT 1 PEN (300MG ) UNDER THE SKIN (SUBCUTANEOUS INJECTION) EVERY 14 DAYS 12 mL 0   FLUoxetine (PROZAC) 40 MG capsule Take 40 mg by mouth at bedtime.     lamoTRIgine (LAMICTAL) 200 MG tablet 2 (two) times daily.     metoprolol succinate (TOPROL-XL) 25 MG 24 hr tablet TAKE 1/2 (ONE-HALF) TABLET BY MOUTH ONCE DAILY FOR  HEARTRATE  AND  BLOOD  PRESSURE 45  tablet 2   metroNIDAZOLE (METROGEL) 0.75 % gel APPLY APPLICATION TOPICALLY TWO (2) TIMES DAILY. 45 g 6   Multiple Vitamin (MULTI-VITAMINS) TABS Take by mouth in the morning and at bedtime.     OLANZapine (ZYPREXA) 10 MG tablet Take 10 mg by mouth at bedtime.     olanzapine-FLUoxetine (SYMBYAX) 12-25 MG per capsule Take 1 capsule by mouth every evening.     omeprazole (PRILOSEC) 40 MG capsule Take 1 capsule (40 mg total) by mouth daily. For heartburn. Office visit required for further refills. 90 capsule 0   Probiotic Product (PROBIOTIC DAILY PO) Take by mouth. Takes 2 QD     simvastatin (ZOCOR) 10 MG tablet TAKE 1 TABLET BY MOUTH EVERY DAY FOR CHOLESTEROL 90 tablet 3   No current facility-administered medications on file prior to visit.    BP 126/84   Pulse 88   Temp (!) 97.4 F (36.3 C) (Temporal)   Ht 5\' 3"  (1.6 m)   Wt 226 lb (102.5 kg)   LMP 09/08/2011   SpO2 95%   BMI 40.03 kg/m  Objective:   Physical Exam Constitutional:      General: She is not in acute distress. Eyes:     General:        Right eye: No foreign body or discharge.        Left eye: No foreign body or discharge.     Conjunctiva/sclera: Conjunctivae normal.     Right eye: Right conjunctiva is not injected.     Left eye: Left conjunctiva is not injected.      Comments: Mild to moderate right upper eyelid swelling from inner canthus to mid eyelid  Cardiovascular:     Rate and Rhythm: Normal rate.  Pulmonary:      Effort: Pulmonary effort is normal.  Neurological:     Mental Status: She is alert.           Assessment & Plan:  Pain and swelling of eyelid of right eye Assessment & Plan: Could be viral, but given presentation, coupled with exhisting allergy treatment, will treat for presumed bacterial involvement.  Start erythromycin ophthalmic ointment at bedtime for 3 to 5 days. She will update if no improvement or if symptoms worsen.  Orders: -     Erythromycin; Place 1 Application into the right eye at bedtime.  Dispense: 3.5 g; Refill: 0        Doreene Nest, NP

## 2023-04-10 NOTE — Patient Instructions (Signed)
You may apply the erythromycin ointment at bedtime for 3 to 5 days for eyelid swelling and pain.  Please let us know if you do not see improvement or if your symptoms worsen.  It was a pleasure to see you today!

## 2023-04-10 NOTE — Assessment & Plan Note (Signed)
Could be viral, but given presentation, coupled with exhisting allergy treatment, will treat for presumed bacterial involvement.  Start erythromycin ophthalmic ointment at bedtime for 3 to 5 days. She will update if no improvement or if symptoms worsen.

## 2023-04-20 NOTE — Telephone Encounter (Signed)
Called and spoke to pt. She is unable to make it here today due to her husband having a work meeting today. I gave her information on [facility] which is open on the weekends. She will go there tomorrow., or possibly tonight depending on how late her husband gets home. She thanked me for the information.   FYI to [place].

## 2023-04-20 NOTE — Telephone Encounter (Signed)
Noted  

## 2023-04-21 DIAGNOSIS — G5701 Lesion of sciatic nerve, right lower limb: Secondary | ICD-10-CM | POA: Diagnosis not present

## 2023-04-21 DIAGNOSIS — M7061 Trochanteric bursitis, right hip: Secondary | ICD-10-CM | POA: Diagnosis not present

## 2023-05-06 NOTE — Progress Notes (Unsigned)
    Tad Fancher T. Avonte Sensabaugh, MD, CAQ Sports Medicine [facility] at [facility] [address] [place], [address]  Phone: [phone]  FAX: [phone]  Katherine Carpenter - 62 y.o. female  MRN [id number]  Date of Birth: 16-Aug-1960  Date: 05/07/2023  PCP: Doreene Nest, NP  Referral: Doreene Nest, NP  No chief complaint on file.  Subjective:   Katherine Carpenter is a 62 y.o. very pleasant female patient with There is no height or weight on file to calculate BMI. who presents with the following:  The patient is here to follow-up with me after multiple falls.  [facility] was seen at [facility] on April 21, 2023.  That point they felt as if she had piriformis syndrome as well as right-sided trochanteric bursitis.  She was given a Medrol Dosepak. They did obtain plain films of the right hip which showed mild arthritic changes.  Prior to this, she fell off of a deck several weeks ago.  She reports 2 other falls.  She reports some imbalance while taking muscle relaxers.      Review of Systems is noted in the HPI, as appropriate  Objective:   LMP 09/08/2011   GEN: No acute distress; alert,appropriate. PULM: Breathing comfortably in no respiratory distress PSYCH: Normally interactive.   Laboratory and Imaging Data:  Assessment and Plan:   ***

## 2023-05-07 ENCOUNTER — Ambulatory Visit (INDEPENDENT_AMBULATORY_CARE_PROVIDER_SITE_OTHER): Payer: Medicare HMO | Admitting: Family Medicine

## 2023-05-07 VITALS — BP 110/66 | HR 81 | Temp 98.8°F | Ht 63.0 in | Wt 225.0 lb

## 2023-05-07 DIAGNOSIS — M25561 Pain in right knee: Secondary | ICD-10-CM | POA: Diagnosis not present

## 2023-05-07 DIAGNOSIS — R29898 Other symptoms and signs involving the musculoskeletal system: Secondary | ICD-10-CM

## 2023-05-07 DIAGNOSIS — M25562 Pain in left knee: Secondary | ICD-10-CM | POA: Diagnosis not present

## 2023-05-07 DIAGNOSIS — M2350 Chronic instability of knee, unspecified knee: Secondary | ICD-10-CM | POA: Diagnosis not present

## 2023-05-07 DIAGNOSIS — G8929 Other chronic pain: Secondary | ICD-10-CM | POA: Diagnosis not present

## 2023-05-07 DIAGNOSIS — W19XXXA Unspecified fall, initial encounter: Secondary | ICD-10-CM | POA: Diagnosis not present

## 2023-05-07 DIAGNOSIS — G4733 Obstructive sleep apnea (adult) (pediatric): Secondary | ICD-10-CM | POA: Diagnosis not present

## 2023-05-07 NOTE — Patient Instructions (Signed)
Voltaren 1% gel, over the counter ?You can apply up to 4 times a day ? ?This can be applied to any joint: knee, wrist, fingers, elbows, shoulders, feet and ankles. ?Can apply to any tendon: tennis elbow, achilles, tendon, rotator cuff or any other tendon. ? ?Minimal is absorbed in the bloodstream: ok with oral anti-inflammatory or a blood thinner. ? ?Cost is about 9 dollars  ?

## 2023-05-17 ENCOUNTER — Encounter: Payer: Self-pay | Admitting: Family Medicine

## 2023-05-17 ENCOUNTER — Ambulatory Visit: Payer: Medicare HMO | Attending: Family Medicine

## 2023-05-17 ENCOUNTER — Ambulatory Visit: Payer: Medicare HMO | Admitting: Physical Therapy

## 2023-05-17 DIAGNOSIS — M25561 Pain in right knee: Secondary | ICD-10-CM | POA: Insufficient documentation

## 2023-05-17 DIAGNOSIS — R2681 Unsteadiness on feet: Secondary | ICD-10-CM

## 2023-05-17 DIAGNOSIS — M2350 Chronic instability of knee, unspecified knee: Secondary | ICD-10-CM | POA: Insufficient documentation

## 2023-05-17 DIAGNOSIS — M25562 Pain in left knee: Secondary | ICD-10-CM | POA: Diagnosis not present

## 2023-05-17 DIAGNOSIS — R29898 Other symptoms and signs involving the musculoskeletal system: Secondary | ICD-10-CM | POA: Insufficient documentation

## 2023-05-17 DIAGNOSIS — W19XXXA Unspecified fall, initial encounter: Secondary | ICD-10-CM | POA: Insufficient documentation

## 2023-05-17 DIAGNOSIS — R262 Difficulty in walking, not elsewhere classified: Secondary | ICD-10-CM | POA: Insufficient documentation

## 2023-05-17 DIAGNOSIS — G8929 Other chronic pain: Secondary | ICD-10-CM | POA: Diagnosis not present

## 2023-05-17 DIAGNOSIS — M6281 Muscle weakness (generalized): Secondary | ICD-10-CM | POA: Diagnosis not present

## 2023-05-17 NOTE — Therapy (Signed)
OUTPATIENT PHYSICAL THERAPY LOWER EXTREMITY EVALUATION   Patient Name: Katherine Carpenter MRN: [id number] DOB:January 04, 1961, 62 y.o., female Today's Date: 05/17/2023  END OF SESSION:  PT End of Session - 05/17/23 1513     Visit Number 1    Number of Visits 17    Date for PT Re-Evaluation 07/12/23    PT Start Time 0847    PT Stop Time 0930    PT Time Calculation (min) 43 min    Equipment Utilized During Treatment Gait belt    Activity Tolerance Patient tolerated treatment well    Behavior During Therapy WFL for tasks assessed/performed             Past Medical History:  Diagnosis Date   Arthritis    osteo   Bipolar 1 disorder (HCC)    Depression    GERD (gastroesophageal reflux disease)    Hypertension    PONV (postoperative nausea and vomiting)    Sleep apnea    CPAP   Past Surgical History:  Procedure Laterality Date   CATARACT EXTRACTION W/PHACO Right 09/11/2022   Procedure: CATARACT EXTRACTION PHACO AND INTRAOCULAR LENS PLACEMENT (IOC) RIGHT  2.97  00:21.1;  Surgeon: Nevada Crane, MD;  Location: [facility];  Service: Ophthalmology;  Laterality: Right;   CATARACT EXTRACTION W/PHACO Left 09/25/2022   Procedure: CATARACT EXTRACTION PHACO AND INTRAOCULAR LENS PLACEMENT (IOC) LEFT  5.35  00:34.4;  Surgeon: Nevada Crane, MD;  Location[facility];  Service: Ophthalmology;  Laterality: Left;   CERVICAL BIOPSY  W/ LOOP ELECTRODE EXCISION  2016   COLONOSCOPY WITH PROPOFOL N/A 11/11/2021   Procedure: COLONOSCOPY WITH PROPOFOL;  Surgeon: Toney Reil, MD;  Location: [facility] END[facility]COPY;  Service: Gastroenterology;  Laterality: N/A;   ESOPHAGOGASTRODUODENOSCOPY (EGD) WITH PROPOFOL N/A 10/27/2019   Procedure: ESOPHAGOGASTRODUODENOSCOPY (EGD) WITH PROPOFOL;  Surgeon: Pasty Spillers, MD;  Location: [facility] ENDOSCOPY;  Service: Endoscopy;  Laterality: N/A;   TUBAL LIGATION     Patient Active Problem List   Diagnosis Date Noted   Pain and swelling of  eyelid of right eye 04/10/2023   Acute foot pain, right 03/21/2023   Fall 03/21/2023   Preventative health care 07/25/2022   History of colonic polyps    Cecal polyp    Prediabetes 07/22/2021   Other fatigue 06/02/2021   Eczema 07/09/2020   Balance problem 10/15/2019   Nausea 05/27/2019   GERD (gastroesophageal reflux disease) 10/09/2018   Screening for cervical cancer 08/01/2018   Medicare annual wellness visit, subsequent 05/13/2018   Hyperlipidemia 05/08/2017   Essential hypertension 05/08/2017   Bipolar disorder (HCC) 05/08/2017   Rosacea 05/08/2017    PCP: Vernona Rieger Km NP  REFERRING PROVIDER:   Hannah Beat, MD    REFERRING DIAG:  Diagnosis  M25.561,M25.562,G89.29 (ICD-10-CM) - Bilateral chronic knee pain  M23.50 (ICD-10-CM) - Recurrent instability of knee joint, unspecified laterality  R29.898 (ICD-10-CM) - Leg weakness, bilateral  W19.Lorne Skeens (ICD-10-CM) - Fall, initial encounter    THERAPY DIAG:  Chronic pain of both knees  Difficulty in walking, not elsewhere classified  Muscle weakness (generalized)  Unsteadiness on feet  Rationale for Evaluation and Treatment: Rehabilitation  ONSET DATE: Reports 6 weeks ago Oct 2024  SUBJECTIVE:   SUBJECTIVE STATEMENT: Pt reports onset of knee instability starting approx 6 weeks ago with first of three falls. Pt report she fell when foot turned out while taking out trash, fell down three steps. Second fall occurred when she tripped over her dog. Third fall occurred tripping  over a christmas present. Pt notes inability to stand up/decreased strength in BLE, and that she had to pull herself up using her arms. Has had BUE/BLE pain since falls, has had increased difficulty lifting BUE since falls. She reports she does not feel dizzy when she falls, she is tripping and her knees feel unstable. She reports she was diagnosed with sprained R ankle, but that this has healed nicely.  Reports RLE is weaker than LLE. She  wants to improve BLE strength, and has difficulty with the following: getting up from chairs, navigating steps/stairs, getting into bed, ambulating for long periods of time (knees buckle around 15-20 min), standing limited to 10-15 min. She reports lack of balance reaction when she falls. She has noticed she tends to fall to R side. Has poor balance in the dark/avoids walking in the dark. Reports hx of arthritis, hip bursitis.  Pt accompanied by: self  PERTINENT HISTORY:  Pt reports onset of knee instability starting approx 6 weeks ago with first of three falls. Pt report she fell when foot turned out while taking out trash, fell down three steps. Second fall occurred when she tripped over her dog. Third fall occurred tripping over a christmas present. Pt notes inability to stand up/decreased strength in BLE, and that she had to pull herself up using her arms. Has had BUE/BLE pain since falls, has had increased difficulty lifting BUE since falls. She reports she does not feel dizzy when she falls, she is tripping and her knees feel unstable. She reports she was diagnosed with sprained R ankle, but that this has healed nicely.  Reports RLE is weaker than LLE. She wants to improve BLE strength, and has difficulty with the following: getting up from chairs, navigating steps/stairs, getting into bed, ambulating for long periods of time (knees buckle around 15-20 min), standing limited to 10-15 min. She reports lack of balance reaction when she falls. She has noticed she tends to fall to R side. Has poor balance in the dark/avoids walking in the dark. Reports hx of arthritis, hip bursitis.  PMH significant for arthritis, Bipolar 1 disorder, HTN, sleep apnea, HLD, reports hx of R ankle sprain  PRECAUTIONS: Fall  RED FLAGS: None   WEIGHT BEARING RESTRICTIONS: No  PAIN:  Are you having pain? Yes: NPRS scale: 6/10 Pain location: shoulders, upper arms, thighs, knees Pain description: sharp ache Aggravating  factors: sitting for a long time, staying in bed for a long time, prolonged walking Relieving factors: Taking Tylenol 2x/day - takes the edge off pain  FALLS: Has patient fallen in last 6 months? Yes. Number of falls 3 (occurred last 6 weeks to 2 months)  LIVING ENVIRONMENT: Lives with: lives with their spouse Lives in: House/apartment Stairs: Yes: External: 3 steps; bilateral  Has following equipment at home: Single point cane, shower chair, and Grab bars  PLOF: Independent  PATIENT GOALS: Want to walk on my treadmill again, wants to walk outside, be mobile without being scared    NEXT MD VISIT: reports no follow-up currently scheduled. PT recommends follow-up due to ongoing, worsening BUE pain with limited AROM, pt agreeable to this plan to reach out to physician.   OBJECTIVE:  Note: Objective measures were completed at Evaluation unless otherwise noted.  DIAGNOSTIC FINDINGS:   Via chart 03/21/2023 DG FOOT Complete Right " FINDINGS: Borderline hallux valgus. Minimal chronic enthesopathic change at the Achilles insertion on the calcaneus. Joint spaces are preserved. No acute fracture or dislocation.   IMPRESSION: 1. No  acute fracture or dislocation. 2. Borderline hallux valgus.     Electronically Signed   By: Neita Garnet M.D.   On: 03/21/2023 08:59"  PATIENT SURVEYS:  LEFS 22/80 FOTO 40 (goal score 56)    COGNITION: Overall cognitive status: Within functional limits for tasks assessed     SENSATION: Reports no n/t,  Formal testing deferred to future visit  EDEMA:  Reports no swelling   POSTURE:   elevated shoulder bilaterally, increased thoracic kyphosis     LOWER EXTREMITY MMT: Pain limited with hip flexion bilat, pain limited adduction, and knee ext/flex bilat BLE strength grossly 4+/5     FUNCTIONAL TESTS:  5 times sit to stand: 14 sec and bilat knee felt unstable/like they would buckle 10 meter walk test: 0.99 m/s and antalgic, top of L foot  is cramping, increased LTL sway throughout MCTSIB: completes condition 1, 3, unable to complete condition 2 and 4 BERG - deferred - deferred   GAIT: Distance walked: appears antalgic, see above    TODAY'S TREATMENT:                                                                                                                              DATE: 05/17/23  TA Review of HEP: sets and reps performed as prescribed in session to confirm correct technique  Access Code: [facility] URL: [email or web address] Date: 05/17/2023 Prepared by: Temple Pacini  Exercises - Seated March  - 1 x daily - 5-6 x weekly - 3 sets - 10 reps - Seated Hip Abduction  - 1 x daily - 5-6 x weekly - 2 sets - 10 reps  -Attempted LAQ today, but too pain-limited, not added to HEP at this time   PATIENT EDUCATION:  Education details: assessment findings, HEP, plan Person educated: Patient Education method: Explanation, Demonstration, Verbal cues, and Handouts Education comprehension: verbalized understanding, returned demonstration, and needs further education  HOME EXERCISE PROGRAM:  Access Code: [email or web address] URL: [email or web address] Date: 05/17/2023 Prepared by: Temple Pacini  Exercises - Seated March  - 1 x daily - 5-6 x weekly - 3 sets - 10 reps - Seated Hip Abduction  - 1 x daily - 5-6 x weekly - 2 sets - 10 reps  ASSESSMENT:  CLINICAL IMPRESSION: Patient is a pleasant 62 y.o. female who was seen today for physical therapy evaluation and treatment for chronic bilateral LE weakness, feelings of knee instability, pain and recent hx of falls. Examination and hx reveal deficits of endurance, decreased LE function, impaired gait speed, impaired gait mechanics, pain, decreased strength and balance. Further balance testing (BERG) and functional capacity testing ( ) to be completed future visit. The pt will benefit from further skilled PT to address these deficits in order to  decrease fall risk and increase QOL/ease with ADLs.  OBJECTIVE IMPAIRMENTS: Abnormal gait, decreased activity tolerance, decreased balance, decreased endurance, decreased mobility, difficulty walking, decreased strength, impaired UE functional use, improper body mechanics, postural dysfunction, and  pain.   ACTIVITY LIMITATIONS: carrying, lifting, bending, standing, squatting, stairs, transfers, bed mobility, reach over head, and locomotion level  PARTICIPATION LIMITATIONS: meal prep, cleaning, laundry, shopping, community activity, and yard work  PERSONAL FACTORS: Age, Sex, and 3+ comorbidities: PMH significant for arthritis, Bipolar 1 disorder, HTN, sleep apnea, HLD, reports hx of R ankle sprain  are also affecting patient's functional outcome.   REHAB POTENTIAL: Good  CLINICAL DECISION MAKING: Evolving/moderate complexity  EVALUATION COMPLEXITY: Moderate   GOALS: Goals reviewed with patient? Yes   SHORT TERM GOALS: Target date: 06/14/2023    Patient will be independent in home exercise program to improve strength/mobility for better functional independence with ADLs. Baseline:initiated Goal status: INITIAL   LONG TERM GOALS: Target date: 07/12/2023    Patient will increase FOTO score to equal to or greater than 56    to demonstrate statistically significant improvement in mobility and quality of life.  Baseline: 40 Goal status: INITIAL  2.  Patient  will complete five times sit to stand test in < 10 seconds indicating an increased LE strength and improved balance. Baseline: 14 seconds Goal status: INITIAL  3.  Patient will increase Berg Balance score by > 6 points to demonstrate decreased fall risk during functional activities Baseline:  Goal status: INITIAL  4.  Patient will increase 10 meter walk test to >1.2 m/s as to improve gait speed for better community ambulation. Baseline: 0.99 m/s Goal status: INITIAL  5.  Patient will complete at least 15 seconds EC on firm  and compliant surfaces without LOB to demonstrate improved ability to sustain balance in dark/dim environments. Baseline: unable to perform without LOB Goal status: INITIAL  6.  Patient will increase lower extremity functional scale to >60/80 to demonstrate improved functional mobility and increased tolerance with ADLs.   Baseline: 22/80 Goal status: INITIAL  7. Patient will increase six minute walk test distance to >1000 for progression to community ambulator and improve gait ability  Baseline:  Goal Status: INITIAL     PLAN:  PT FREQUENCY: 1-2x/week  PT DURATION: 8 weeks  PLANNED INTERVENTIONS: 97164- PT Re-evaluation, 97110-Therapeutic exercises, 97530- Therapeutic activity, 97112- Neuromuscular re-education, 97535- Self Care, 46962- Manual therapy, [phone]- Gait training, [phone]- Orthotic Fit/training, [phone]- Canalith repositioning, [phone]- Splinting, Patient/Family education, Balance training, Stair training, Taping, Dry Needling, Joint mobilization, Spinal mobilization, Vestibular training, Visual/preceptual remediation/compensation, DME instructions, Cryotherapy, and Moist heat  PLAN FOR NEXT SESSION: complete further assessment, update HEP as needed, balance, strength   Baird Kay, PT 05/17/2023, 3:16 PM

## 2023-05-17 NOTE — Telephone Encounter (Signed)
Amy, can you help her get an appointment with me next week?  Thanks!

## 2023-05-20 NOTE — Progress Notes (Unsigned)
    Tiyonna Sardinha T. Stevana Dufner, MD, CAQ Sports Medicine [facility] at [facility] [address] [place], [address]  Phone: [phone]  FAX: [phone]  Katherine Carpenter - 62 y.o. female  MRN [id number]  Date of Birth: 01/05/1961  Date: 05/21/2023  PCP: Doreene Nest, NP  Referral: Doreene Nest, NP  No chief complaint on file.  Subjective:   Katherine Carpenter is a 62 y.o. very pleasant female patient with There is no height or weight on file to calculate BMI. who presents with the following:  She is a very pleasant lady, and I saw her a few weeks ago with some knee pain.  She presents today with some acute shoulder pain that is new in character.  She had discussed this with physical therapy while she was doing some PT for her knees.  They recommended follow-up and assessment.    Review of Systems is noted in the HPI, as appropriate  Objective:   LMP 09/08/2011   GEN: No acute distress; alert,appropriate. PULM: Breathing comfortably in no respiratory distress PSYCH: Normally interactive.   Laboratory and Imaging Data:  Assessment and Plan:   ***

## 2023-05-21 ENCOUNTER — Ambulatory Visit: Payer: Medicare HMO | Admitting: Family Medicine

## 2023-05-21 ENCOUNTER — Ambulatory Visit (INDEPENDENT_AMBULATORY_CARE_PROVIDER_SITE_OTHER): Payer: Medicare HMO | Admitting: Family Medicine

## 2023-05-21 ENCOUNTER — Encounter: Payer: Self-pay | Admitting: Family Medicine

## 2023-05-21 ENCOUNTER — Ambulatory Visit: Payer: Medicare HMO

## 2023-05-21 VITALS — BP 130/80 | HR 108 | Temp 98.2°F | Ht 63.0 in | Wt 223.1 lb

## 2023-05-21 DIAGNOSIS — M6281 Muscle weakness (generalized): Secondary | ICD-10-CM | POA: Diagnosis not present

## 2023-05-21 DIAGNOSIS — S46811A Strain of other muscles, fascia and tendons at shoulder and upper arm level, right arm, initial encounter: Secondary | ICD-10-CM | POA: Diagnosis not present

## 2023-05-21 DIAGNOSIS — M25561 Pain in right knee: Secondary | ICD-10-CM | POA: Diagnosis not present

## 2023-05-21 DIAGNOSIS — M2350 Chronic instability of knee, unspecified knee: Secondary | ICD-10-CM | POA: Diagnosis not present

## 2023-05-21 DIAGNOSIS — M25511 Pain in right shoulder: Secondary | ICD-10-CM

## 2023-05-21 DIAGNOSIS — R2681 Unsteadiness on feet: Secondary | ICD-10-CM

## 2023-05-21 DIAGNOSIS — W19XXXA Unspecified fall, initial encounter: Secondary | ICD-10-CM | POA: Diagnosis not present

## 2023-05-21 DIAGNOSIS — R29898 Other symptoms and signs involving the musculoskeletal system: Secondary | ICD-10-CM | POA: Diagnosis not present

## 2023-05-21 DIAGNOSIS — R262 Difficulty in walking, not elsewhere classified: Secondary | ICD-10-CM | POA: Diagnosis not present

## 2023-05-21 DIAGNOSIS — G8929 Other chronic pain: Secondary | ICD-10-CM | POA: Diagnosis not present

## 2023-05-21 DIAGNOSIS — M25562 Pain in left knee: Secondary | ICD-10-CM | POA: Diagnosis not present

## 2023-05-21 NOTE — Therapy (Signed)
OUTPATIENT PHYSICAL THERAPY LOWER EXTREMITY TREATMENT   Patient Name: MASHA SESTER MRN: [id number] DOB:[birth year], 62 y.o., female Today's Date: 05/21/2023  END OF SESSION:  PT End of Session - 05/21/23 1144     Visit Number 2    Number of Visits 17    Date for PT Re-Evaluation 07/12/23    PT Start Time 1102    PT Stop Time 1143    PT Time Calculation (min) 41 min    Equipment Utilized During Treatment Gait belt    Activity Tolerance Patient tolerated treatment well    Behavior During Therapy WFL for tasks assessed/performed              Past Medical History:  Diagnosis Date   Arthritis    osteo   Bipolar 1 disorder (HCC)    Depression    GERD (gastroesophageal reflux disease)    Hypertension    PONV (postoperative nausea and vomiting)    Sleep apnea    CPAP   Past Surgical History:  Procedure Laterality Date   CATARACT EXTRACTION W/PHACO Right 09/11/2022   Procedure: CATARACT EXTRACTION PHACO AND INTRAOCULAR LENS PLACEMENT (IOC) RIGHT  2.97  00:21.1;  Surgeon: Nevada Crane, MD;  Location: [facility];  Service: Ophthalmology;  Laterality: Right;   CATARACT EXTRACTION W/PHACO Left 09/25/2022   Procedure: CATARACT EXTRACTION PHACO AND INTRAOCULAR LENS PLACEMENT (IOC) LEFT  5.35  00:34.4;  Surgeon: Nevada Crane, MD;  Location: [facility];  Service: Ophthalmology;  Laterality: Left;   CERVICAL BIOPSY  W/ LOOP ELECTRODE EXCISION  2016   COLONOSCOPY WITH PROPOFOL N/A 11/11/2021   Procedure: COLONOSCOPY WITH PROPOFOL;  Surgeon: Toney Reil, MD;  Location: [facility];  Service: Gastroenterology;  Laterality: N/A;   ESOPHAGOGASTRODUODENOSCOPY (EGD) WITH PROPOFOL N/A 10/27/2019   Procedure: ESOPHAGOGASTRODUODENOSCOPY (EGD) WITH PROPOFOL;  Surgeon: Pasty Spillers, MD;  Location: [facility];  Service: Endoscopy;  Laterality: N/A;   TUBAL LIGATION     Patient Active Problem List   Diagnosis Date Noted   Pain and swelling of  eyelid of right eye 04/10/2023   Acute foot pain, right 03/21/2023   Fall 03/21/2023   Preventative health care 07/25/2022   History of colonic polyps    Cecal polyp    Prediabetes 07/22/2021   Other fatigue 06/02/2021   Eczema 07/09/2020   Balance problem 10/15/2019   Nausea 05/27/2019   GERD (gastroesophageal reflux disease) 10/09/2018   Screening for cervical cancer 08/01/2018   Medicare annual wellness visit, subsequent 05/13/2018   Hyperlipidemia 05/08/2017   Essential hypertension 05/08/2017   Bipolar disorder (HCC) 05/08/2017   Rosacea 05/08/2017    PCP: Vernona Rieger Km NP  REFERRING PROVIDER:   Hannah Beat, MD    REFERRING DIAG:  Diagnosis  M25.561,M25.562,G89.29 (ICD-10-CM) - Bilateral chronic knee pain  M23.50 (ICD-10-CM) - Recurrent instability of knee joint, unspecified laterality  R29.898 (ICD-10-CM) - Leg weakness, bilateral  W19.Lorne Skeens (ICD-10-CM) - Fall, initial encounter    THERAPY DIAG:  Unsteadiness on feet  Chronic pain of both knees  Muscle weakness (generalized)  Rationale for Evaluation and Treatment: Rehabilitation  ONSET DATE: Reports 6 weeks ago Oct 2024  SUBJECTIVE:   SUBJECTIVE STATEMENT: Pt has 2 pm appt with her doctor to follow-up about her R arm/shoulder. Pt reports her knees don't feel as weak doing the exercises as before, feels better, she doesn't feel as wobbly. Pt stopped Tylenol due to stomach pain, doing ok. Pain level has been 5/10 with movement. Pt  accompanied by: self  PERTINENT HISTORY:  Pt reports onset of knee instability starting approx 6 weeks ago with first of three falls. Pt report she fell when foot turned out while taking out trash, fell down three steps. Second fall occurred when she tripped over her dog. Third fall occurred tripping over a christmas present. Pt notes inability to stand up/decreased strength in BLE, and that she had to pull herself up using her arms. Has had BUE/BLE pain since falls, has  had increased difficulty lifting BUE since falls. She reports she does not feel dizzy when she falls, she is tripping and her knees feel unstable. She reports she was diagnosed with sprained R ankle, but that this has healed nicely.  Reports RLE is weaker than LLE. She wants to improve BLE strength, and has difficulty with the following: getting up from chairs, navigating steps/stairs, getting into bed, ambulating for long periods of time (knees buckle around 15-20 min), standing limited to 10-15 min. She reports lack of balance reaction when she falls. She has noticed she tends to fall to R side. Has poor balance in the dark/avoids walking in the dark. Reports hx of arthritis, hip bursitis.  PMH significant for arthritis, Bipolar 1 disorder, HTN, sleep apnea, HLD, reports hx of R ankle sprain  PRECAUTIONS: Fall  RED FLAGS: None   WEIGHT BEARING RESTRICTIONS: No  PAIN:  Are you having pain? Yes: NPRS scale: 6/10 Pain location: shoulders, upper arms, thighs, knees Pain description: sharp ache Aggravating factors: sitting for a long time, staying in bed for a long time, prolonged walking Relieving factors: Taking Tylenol 2x/day - takes the edge off pain  FALLS: Has patient fallen in last 6 months? Yes. Number of falls 3 (occurred last 6 weeks to 2 months)  LIVING ENVIRONMENT: Lives with: lives with their spouse Lives in: House/apartment Stairs: Yes: External: 3 steps; bilateral  Has following equipment at home: Single point cane, shower chair, and Grab bars  PLOF: Independent  PATIENT GOALS: Want to walk on my treadmill again, wants to walk outside, be mobile without being scared    NEXT MD VISIT: reports no follow-up currently scheduled. PT recommends follow-up due to ongoing, worsening BUE pain with limited AROM, pt agreeable to this plan to reach out to physician.   OBJECTIVE:  Note: Objective measures were completed at Evaluation unless otherwise noted.  DIAGNOSTIC FINDINGS:    Via chart 03/21/2023 DG FOOT Complete Right " FINDINGS: Borderline hallux valgus. Minimal chronic enthesopathic change at the Achilles insertion on the calcaneus. Joint spaces are preserved. No acute fracture or dislocation.   IMPRESSION: 1. No acute fracture or dislocation. 2. Borderline hallux valgus.     Electronically Signed   By: Neita Garnet M.D.   On: 03/21/2023 08:59"  PATIENT SURVEYS:  LEFS 22/80 FOTO 40 (goal score 56)    COGNITION: Overall cognitive status: Within functional limits for tasks assessed     SENSATION: Reports no n/t,  Formal testing deferred to future visit  EDEMA:  Reports no swelling   POSTURE:   elevated shoulder bilaterally, increased thoracic kyphosis     LOWER EXTREMITY MMT: Pain limited with hip flexion bilat, pain limited adduction, and knee ext/flex bilat BLE strength grossly 4+/5     FUNCTIONAL TESTS:  5 times sit to stand: 14 sec and bilat knee felt unstable/like they would buckle 10 meter walk test: 0.99 m/s and antalgic, top of L foot is cramping, increased LTL sway throughout MCTSIB: completes condition 1, 3, unable  to complete condition 2 and 4 BERG - deferred - deferred   GAIT: Distance walked: appears antalgic, see above    TODAY'S TREATMENT:                                                                                                                              DATE: 05/21/23   TA: BERG - 52/56 : 1105 ft  Updated and reviewed in session with sets and reps completed as prescribed to confirm understanding  Access Code: [email or web address] URL: [email or web address] Date: 05/21/2023 Prepared by: Temple Pacini  Exercises - Standing Single Leg Stance with Counter Support  - 1 x daily - 7 x weekly - 2 sets - 1 reps - 30 sec/leg hold - Standing Balance in Corner with Eyes Closed  - 1 x daily - 7 x weekly - 4 sets - 1 reps - 30 seconds hold - Standing Tandem Balance with Counter Support   - 1 x daily - 7 x weekly - 2 sets - 1 reps - 30 seconds/leg   TE:  Treadmill x 6 min total up to 2.7 mph, with cool-down and warm up minutes. Pt adjusts intensity throughout/monitors pt for response. Completed in session to determine safety with return to activity/for home use. Pt not symptomatic with intervention, reports improvement in BLE following intervention. PT uses BUE support. OK for home use.   PATIENT EDUCATION:  Education details: assessment findings, HEP, plan Person educated: Patient Education method: Explanation, Demonstration, Verbal cues, and Handouts Education comprehension: verbalized understanding, returned demonstration, and needs further education  HOME EXERCISE PROGRAM:  Access Code: [email or web address] URL: [email or web address] Date: 05/17/2023 Prepared by: Temple Pacini  Exercises - Seated March  - 1 x daily - 5-6 x weekly - 3 sets - 10 reps - Seated Hip Abduction  - 1 x daily - 5-6 x weekly - 2 sets - 10 reps  ASSESSMENT:  CLINICAL IMPRESSION: BERG and completed today and goal revised for improved performance as pt completes >1000 ft . t found to have greatest deficits with EC, SLB and tandem stance on BERG. Balance HEP created, reviewed and provided to pt to address these deficits. Additionally, pt found to be safe with treadmill technique during visit and is OK to return to home use, instruction to use BUE support and start out with similar speeds at home for 5-6 min, progress from there if ok. The pt will benefit from further skilled PT to address these deficits in order to decrease fall risk and increase QOL/ease with ADLs.  OBJECTIVE IMPAIRMENTS: Abnormal gait, decreased activity tolerance, decreased balance, decreased endurance, decreased mobility, difficulty walking, decreased strength, impaired UE functional use, improper body mechanics, postural dysfunction, and pain.   ACTIVITY LIMITATIONS: carrying, lifting, bending, standing, squatting,  stairs, transfers, bed mobility, reach over head, and locomotion level  PARTICIPATION LIMITATIONS: meal prep, cleaning, laundry, shopping, community activity, and yard work  PERSONAL FACTORS: Age, Sex, and 3+ comorbidities: PMH  significant for arthritis, Bipolar 1 disorder, HTN, sleep apnea, HLD, reports hx of R ankle sprain  are also affecting patient's functional outcome.   REHAB POTENTIAL: Good  CLINICAL DECISION MAKING: Evolving/moderate complexity  EVALUATION COMPLEXITY: Moderate   GOALS: Goals reviewed with patient? Yes   SHORT TERM GOALS: Target date: 06/14/2023    Patient will be independent in home exercise program to improve strength/mobility for better functional independence with ADLs. Baseline:initiated Goal status: INITIAL   LONG TERM GOALS: Target date: 07/12/2023    Patient will increase FOTO score to equal to or greater than 56    to demonstrate statistically significant improvement in mobility and quality of life.  Baseline: 40 Goal status: INITIAL  2.  Patient  will complete five times sit to stand test in < 10 seconds indicating an increased LE strength and improved balance. Baseline: 14 seconds Goal status: INITIAL  3.  Patient will increase Berg Balance score by > 6 points to demonstrate decreased fall risk during functional activities Baseline:  Goal status: INITIAL  4.  Patient will increase 10 meter walk test to >1.2 m/s as to improve gait speed for better community ambulation. Baseline: 0.99 m/s Goal status: INITIAL  5.  Patient will complete at least 15 seconds EC on firm and compliant surfaces without LOB to demonstrate improved ability to sustain balance in dark/dim environments. Baseline: unable to perform without LOB Goal status: INITIAL  6.  Patient will increase lower extremity functional scale to >60/80 to demonstrate improved functional mobility and increased tolerance with ADLs.   Baseline: 22/80 Goal status: INITIAL  7. Patient  will increase six minute walk test distance to >1200 ft for progression to community ambulator and improve gait ability  Baseline:1105 ft with slight cramping that resolved with rest   Goal Status: INITIAL/REVISED      PLAN:  PT FREQUENCY: 1-2x/week  PT DURATION: 8 weeks  PLANNED INTERVENTIONS: 97164- PT Re-evaluation, 97110-Therapeutic exercises, 97530- Therapeutic activity, 97112- Neuromuscular re-education, 97535- Self Care, 09811- Manual therapy, [phone]- Gait training, [phone]- Orthotic Fit/training, [phone]- Canalith repositioning, [phone]- Splinting, Patient/Family education, Balance training, Stair training, Taping, Dry Needling, Joint mobilization, Spinal mobilization, Vestibular training, Visual/preceptual remediation/compensation, DME instructions, Cryotherapy, and Moist heat  PLAN FOR NEXT SESSION: complete further assessment, update HEP as needed, balance, strength   Baird Kay, PT 05/21/2023, 11:51 AM

## 2023-05-23 ENCOUNTER — Other Ambulatory Visit: Payer: Self-pay

## 2023-05-23 DIAGNOSIS — L309 Dermatitis, unspecified: Secondary | ICD-10-CM

## 2023-05-23 MED ORDER — DUPIXENT 300 MG/2ML ~~LOC~~ SOAJ: SUBCUTANEOUS | 0 refills | Status: DC

## 2023-05-24 ENCOUNTER — Other Ambulatory Visit: Payer: Self-pay

## 2023-05-24 ENCOUNTER — Ambulatory Visit: Payer: Medicare HMO

## 2023-05-24 ENCOUNTER — Telehealth: Payer: Self-pay

## 2023-05-24 NOTE — Telephone Encounter (Signed)
Patient Name: Katherine Carpenter MRN: [id number] DOB:05/21/1961, 62 y.o., female Today's Date: 05/24/2023  Pt contacted via telephone and patient answered phone stating she totally forgot her appointment today at 11 am. Instructed her of next PT appointment date and time and she verified and stated she would be there next Thurs 05/31/2023 at 2 PM.      Lenda Kelp, PT 05/24/2023, 11:25 AM

## 2023-05-24 NOTE — Therapy (Incomplete)
OUTPATIENT PHYSICAL THERAPY LOWER EXTREMITY TREATMENT   Patient Name: Katherine Carpenter MRN: [id number] DOB:12/12/1960, 62 y.o., female Today's Date: 05/24/2023  END OF SESSION:     Past Medical History:  Diagnosis Date   Arthritis    osteo   Bipolar 1 disorder (HCC)    Depression    GERD (gastroesophageal reflux disease)    Hypertension    PONV (postoperative nausea and vomiting)    Sleep apnea    CPAP   Past Surgical History:  Procedure Laterality Date   CATARACT EXTRACTION W/PHACO Right 09/11/2022   Procedure: CATARACT EXTRACTION PHACO AND INTRAOCULAR LENS PLACEMENT (IOC) RIGHT  2.97  00:21.1;  Surgeon: Nevada Crane, MD;  Location: [facility];  Service: Ophthalmology;  Laterality: Right;   CATARACT EXTRACTION W/PHACO Left 09/25/2022   Procedure: CATARACT EXTRACTION PHACO AND INTRAOCULAR LENS PLACEMENT (IOC) LEFT  5.35  00:34.4;  Surgeon: Nevada Crane, MD;  Location: [facility];  Service: Ophthalmology;  Laterality: Left;   CERVICAL BIOPSY  W/ LOOP ELECTRODE EXCISION  2016   COLONOSCOPY WITH PROPOFOL N/A 11/11/2021   Procedure: COLONOSCOPY WITH PROPOFOL;  Surgeon: Toney Reil, MD;  Location: [facility];  Service: Gastroenterology;  Laterality: N/A;   ESOPHAGOGASTRODUODENOSCOPY (EGD) WITH PROPOFOL N/A 10/27/2019   Procedure: ESOPHAGOGASTRODUODENOSCOPY (EGD) WITH PROPOFOL;  Surgeon: Pasty Spillers, MD;  Location: [facility] ENDOSCOPY;  Service: Endoscopy;  Laterality: N/A;   TUBAL LIGATION     Patient Active Problem List   Diagnosis Date Noted   Pain and swelling of eyelid of right eye 04/10/2023   Acute foot pain, right 03/21/2023   Fall 03/21/2023   Preventative health care 07/25/2022   History of colonic polyps    Cecal polyp    Prediabetes 07/22/2021   Other fatigue 06/02/2021   Eczema 07/09/2020   Balance problem 10/15/2019   Nausea 05/27/2019   GERD (gastroesophageal reflux disease) 10/09/2018   Screening for cervical  cancer 08/01/2018   Medicare annual wellness visit, subsequent 05/13/2018   Hyperlipidemia 05/08/2017   Essential hypertension 05/08/2017   Bipolar disorder (HCC) 05/08/2017   Rosacea 05/08/2017    PCP: Vernona Rieger Km NP  REFERRING PROVIDER:   Hannah Beat, MD    REFERRING DIAG:  Diagnosis  M25.561,M25.562,G89.29 (ICD-10-CM) - Bilateral chronic knee pain  M23.50 (ICD-10-CM) - Recurrent instability of knee joint, unspecified laterality  R29.898 (ICD-10-CM) - Leg weakness, bilateral  W19.Lorne Skeens (ICD-10-CM) - Fall, initial encounter    THERAPY DIAG:  No diagnosis found.  Rationale for Evaluation and Treatment: Rehabilitation  ONSET DATE: Reports 6 weeks ago Oct 2024  SUBJECTIVE:   SUBJECTIVE STATEMENT: Pt has 2 pm appt with her doctor to follow-up about her R arm/shoulder. Pt reports her knees don't feel as weak doing the exercises as before, feels better, she doesn't feel as wobbly. Pt stopped Tylenol due to stomach pain, doing ok. Pain level has been 5/10 with movement. Pt accompanied by: self  PERTINENT HISTORY:  Pt reports onset of knee instability starting approx 6 weeks ago with first of three falls. Pt report she fell when foot turned out while taking out trash, fell down three steps. Second fall occurred when she tripped over her dog. Third fall occurred tripping over a christmas present. Pt notes inability to stand up/decreased strength in BLE, and that she had to pull herself up using her arms. Has had BUE/BLE pain since falls, has had increased difficulty lifting BUE since falls. She reports she does not feel dizzy when she falls,  she is tripping and her knees feel unstable. She reports she was diagnosed with sprained R ankle, but that this has healed nicely.  Reports RLE is weaker than LLE. She wants to improve BLE strength, and has difficulty with the following: getting up from chairs, navigating steps/stairs, getting into bed, ambulating for long periods of time  (knees buckle around 15-20 min), standing limited to 10-15 min. She reports lack of balance reaction when she falls. She has noticed she tends to fall to R side. Has poor balance in the dark/avoids walking in the dark. Reports hx of arthritis, hip bursitis.  PMH significant for arthritis, Bipolar 1 disorder, HTN, sleep apnea, HLD, reports hx of R ankle sprain  PRECAUTIONS: Fall  RED FLAGS: None   WEIGHT BEARING RESTRICTIONS: No  PAIN:  Are you having pain? Yes: NPRS scale: 6/10 Pain location: shoulders, upper arms, thighs, knees Pain description: sharp ache Aggravating factors: sitting for a long time, staying in bed for a long time, prolonged walking Relieving factors: Taking Tylenol 2x/day - takes the edge off pain  FALLS: Has patient fallen in last 6 months? Yes. Number of falls 3 (occurred last 6 weeks to 2 months)  LIVING ENVIRONMENT: Lives with: lives with their spouse Lives in: House/apartment Stairs: Yes: External: 3 steps; bilateral  Has following equipment at home: Single point cane, shower chair, and Grab bars  PLOF: Independent  PATIENT GOALS: Want to walk on my treadmill again, wants to walk outside, be mobile without being scared    NEXT MD VISIT: reports no follow-up currently scheduled. PT recommends follow-up due to ongoing, worsening BUE pain with limited AROM, pt agreeable to this plan to reach out to physician.   OBJECTIVE:  Note: Objective measures were completed at Evaluation unless otherwise noted.  DIAGNOSTIC FINDINGS:   Via chart 03/21/2023 DG FOOT Complete Right " FINDINGS: Borderline hallux valgus. Minimal chronic enthesopathic change at the Achilles insertion on the calcaneus. Joint spaces are preserved. No acute fracture or dislocation.   IMPRESSION: 1. No acute fracture or dislocation. 2. Borderline hallux valgus.     Electronically Signed   By: Neita Garnet M.D.   On: 03/21/2023 08:59"  PATIENT SURVEYS:  LEFS 22/80 FOTO 40 (goal  score 56)    COGNITION: Overall cognitive status: Within functional limits for tasks assessed     SENSATION: Reports no n/t,  Formal testing deferred to future visit  EDEMA:  Reports no swelling   POSTURE:   elevated shoulder bilaterally, increased thoracic kyphosis     LOWER EXTREMITY MMT: Pain limited with hip flexion bilat, pain limited adduction, and knee ext/flex bilat BLE strength grossly 4+/5     FUNCTIONAL TESTS:  5 times sit to stand: 14 sec and bilat knee felt unstable/like they would buckle 10 meter walk test: 0.99 m/s and antalgic, top of L foot is cramping, increased LTL sway throughout MCTSIB: completes condition 1, 3, unable to complete condition 2 and 4 BERG - deferred - deferred   GAIT: Distance walked: appears antalgic, see above    TODAY'S TREATMENT:  DATE: 05/24/23   TA: BERG - 52/56 : 1105 ft  Updated and reviewed in session with sets and reps completed as prescribed to confirm understanding    TE:  Treadmill x 6 min total up to 2.7 mph, with cool-down and warm up minutes. Pt adjusts intensity throughout/monitors pt for response. Completed in session to determine safety with return to activity/for home use. Pt not symptomatic with intervention, reports improvement in BLE following intervention. PT uses BUE support. OK for home use.   PATIENT EDUCATION:  Education details: assessment findings, HEP, plan Person educated: Patient Education method: Explanation, Demonstration, Verbal cues, and Handouts Education comprehension: verbalized understanding, returned demonstration, and needs further education  HOME EXERCISE PROGRAM: Access Code: [email or web address] URL: [email or web address] Date: 05/21/2023 Prepared by: Temple Pacini  Exercises - Standing Single Leg Stance with Counter Support  - 1 x  daily - 7 x weekly - 2 sets - 1 reps - 30 sec/leg hold - Standing Balance in Corner with Eyes Closed  - 1 x daily - 7 x weekly - 4 sets - 1 reps - 30 seconds hold - Standing Tandem Balance with Counter Support  - 1 x daily - 7 x weekly - 2 sets - 1 reps - 30 seconds/leg   Access Code: [email or web address] URL: [email or web address] Date: 05/17/2023 Prepared by: Temple Pacini  Exercises - Seated March  - 1 x daily - 5-6 x weekly - 3 sets - 10 reps - Seated Hip Abduction  - 1 x daily - 5-6 x weekly - 2 sets - 10 reps  ASSESSMENT:  CLINICAL IMPRESSION: *** BERG and completed today and goal revised for improved performance as pt completes >1000 ft . t found to have greatest deficits with EC, SLB and tandem stance on BERG. Balance HEP created, reviewed and provided to pt to address these deficits. Additionally, pt found to be safe with treadmill technique during visit and is OK to return to home use, instruction to use BUE support and start out with similar speeds at home for 5-6 min, progress from there if ok. The pt will benefit from further skilled PT to address these deficits in order to decrease fall risk and increase QOL/ease with ADLs.  OBJECTIVE IMPAIRMENTS: Abnormal gait, decreased activity tolerance, decreased balance, decreased endurance, decreased mobility, difficulty walking, decreased strength, impaired UE functional use, improper body mechanics, postural dysfunction, and pain.   ACTIVITY LIMITATIONS: carrying, lifting, bending, standing, squatting, stairs, transfers, bed mobility, reach over head, and locomotion level  PARTICIPATION LIMITATIONS: meal prep, cleaning, laundry, shopping, community activity, and yard work  PERSONAL FACTORS: Age, Sex, and 3+ comorbidities: PMH significant for arthritis, Bipolar 1 disorder, HTN, sleep apnea, HLD, reports hx of R ankle sprain  are also affecting patient's functional outcome.   REHAB POTENTIAL: Good  CLINICAL DECISION MAKING:  Evolving/moderate complexity  EVALUATION COMPLEXITY: Moderate   GOALS: Goals reviewed with patient? Yes   SHORT TERM GOALS: Target date: 06/14/2023    Patient will be independent in home exercise program to improve strength/mobility for better functional independence with ADLs. Baseline:initiated Goal status: INITIAL   LONG TERM GOALS: Target date: 07/12/2023    Patient will increase FOTO score to equal to or greater than 56    to demonstrate statistically significant improvement in mobility and quality of life.  Baseline: 40 Goal status: INITIAL  2.  Patient  will complete five times sit to stand test in < 10 seconds indicating an increased LE strength and improved balance. Baseline: 14 seconds Goal  status: INITIAL  3.  Patient will increase Berg Balance score by > 6 points to demonstrate decreased fall risk during functional activities Baseline:  Goal status: INITIAL  4.  Patient will increase 10 meter walk test to >1.2 m/s as to improve gait speed for better community ambulation. Baseline: 0.99 m/s Goal status: INITIAL  5.  Patient will complete at least 15 seconds EC on firm and compliant surfaces without LOB to demonstrate improved ability to sustain balance in dark/dim environments. Baseline: unable to perform without LOB Goal status: INITIAL  6.  Patient will increase lower extremity functional scale to >60/80 to demonstrate improved functional mobility and increased tolerance with ADLs.   Baseline: 22/80 Goal status: INITIAL  7. Patient will increase six minute walk test distance to >1200 ft for progression to community ambulator and improve gait ability  Baseline:1105 ft with slight cramping that resolved with rest   Goal Status: INITIAL/REVISED      PLAN:  PT FREQUENCY: 1-2x/week  PT DURATION: 8 weeks  PLANNED INTERVENTIONS: 97164- PT Re-evaluation, 97110-Therapeutic exercises, 97530- Therapeutic activity, 97112- Neuromuscular re-education, 97535-  Self Care, 32440- Manual therapy, [phone]- Gait training, [phone]- Orthotic Fit/training, [phone]- Canalith repositioning, [phone]- Splinting, Patient/Family education, Balance training, Stair training, Taping, Dry Needling, Joint mobilization, Spinal mobilization, Vestibular training, Visual/preceptual remediation/compensation, DME instructions, Cryotherapy, and Moist heat  PLAN FOR NEXT SESSION: complete further assessment, update HEP as needed, balance, strength   Lenda Kelp, PT 05/24/2023, 10:28 AM

## 2023-05-31 ENCOUNTER — Ambulatory Visit: Payer: Medicare HMO | Admitting: Physical Therapy

## 2023-05-31 DIAGNOSIS — M6281 Muscle weakness (generalized): Secondary | ICD-10-CM | POA: Diagnosis not present

## 2023-05-31 DIAGNOSIS — W19XXXA Unspecified fall, initial encounter: Secondary | ICD-10-CM | POA: Diagnosis not present

## 2023-05-31 DIAGNOSIS — G8929 Other chronic pain: Secondary | ICD-10-CM | POA: Diagnosis not present

## 2023-05-31 DIAGNOSIS — R29898 Other symptoms and signs involving the musculoskeletal system: Secondary | ICD-10-CM | POA: Diagnosis not present

## 2023-05-31 DIAGNOSIS — R2681 Unsteadiness on feet: Secondary | ICD-10-CM

## 2023-05-31 DIAGNOSIS — M25561 Pain in right knee: Secondary | ICD-10-CM | POA: Diagnosis not present

## 2023-05-31 DIAGNOSIS — M2350 Chronic instability of knee, unspecified knee: Secondary | ICD-10-CM | POA: Diagnosis not present

## 2023-05-31 DIAGNOSIS — R262 Difficulty in walking, not elsewhere classified: Secondary | ICD-10-CM | POA: Diagnosis not present

## 2023-05-31 DIAGNOSIS — M25562 Pain in left knee: Secondary | ICD-10-CM | POA: Diagnosis not present

## 2023-05-31 NOTE — Therapy (Signed)
OUTPATIENT PHYSICAL THERAPY LOWER EXTREMITY TREATMENT   Patient Name: Katherine Carpenter MRN: [id number] DOB:16-Apr-1961, 62 y.o., female Today's Date: 05/31/2023  END OF SESSION:  PT End of Session - 05/31/23 1348     Visit Number 3    Number of Visits 17    Date for PT Re-Evaluation 07/12/23    PT Start Time 1400    PT Stop Time 1440    PT Time Calculation (min) 40 min    Equipment Utilized During Treatment Gait belt    Activity Tolerance Patient tolerated treatment well    Behavior During Therapy WFL for tasks assessed/performed              Past Medical History:  Diagnosis Date   Arthritis    osteo   Bipolar 1 disorder (HCC)    Depression    GERD (gastroesophageal reflux disease)    Hypertension    PONV (postoperative nausea and vomiting)    Sleep apnea    CPAP   Past Surgical History:  Procedure Laterality Date   CATARACT EXTRACTION W/PHACO Right 09/11/2022   Procedure: CATARACT EXTRACTION PHACO AND INTRAOCULAR LENS PLACEMENT (IOC) RIGHT  2.97  00:21.1;  Surgeon: Katherine Crane, MD;  Location: [facility];  Service: Ophthalmology;  Laterality: Right;   CATARACT EXTRACTION W/PHACO Left 09/25/2022   Procedure: CATARACT EXTRACTION PHACO AND INTRAOCULAR LENS PLACEMENT (IOC) LEFT  5.35  00:34.4;  Surgeon: Katherine Crane, MD;  Location: [facility];  Service: Ophthalmology;  Laterality: Left;   CERVICAL BIOPSY  W/ LOOP ELECTRODE EXCISION  2016   COLONOSCOPY WITH PROPOFOL N/A 11/11/2021   Procedure: COLONOSCOPY WITH PROPOFOL;  Surgeon: Katherine Reil, MD;  Location: [facility];  Service: Gastroenterology;  Laterality: N/A;   ESOPHAGOGASTRODUODENOSCOPY (EGD) WITH PROPOFOL N/A 10/27/2019   Procedure: ESOPHAGOGASTRODUODENOSCOPY (EGD) WITH PROPOFOL;  Surgeon: Katherine Spillers, MD;  Location: [facility];  Service: Endoscopy;  Laterality: N/A;   TUBAL LIGATION     Patient Active Problem List   Diagnosis Date Noted   Pain and swelling of  eyelid of right eye 04/10/2023   Acute foot pain, right 03/21/2023   Fall 03/21/2023   Preventative health care 07/25/2022   History of colonic polyps    Cecal polyp    Prediabetes 07/22/2021   Other fatigue 06/02/2021   Eczema 07/09/2020   Balance problem 10/15/2019   Nausea 05/27/2019   GERD (gastroesophageal reflux disease) 10/09/2018   Screening for cervical cancer 08/01/2018   Medicare annual wellness visit, subsequent 05/13/2018   Hyperlipidemia 05/08/2017   Essential hypertension 05/08/2017   Bipolar disorder (HCC) 05/08/2017   Rosacea 05/08/2017    PCP: Katherine Rieger Km NP  REFERRING PROVIDER:   Hannah Beat, MD    REFERRING DIAG:  Diagnosis  M25.561,M25.562,G89.29 (ICD-10-CM) - Bilateral chronic knee pain  M23.50 (ICD-10-CM) - Recurrent instability of knee joint, unspecified laterality  R29.898 (ICD-10-CM) - Leg weakness, bilateral  W19.Lorne Skeens (ICD-10-CM) - Fall, initial encounter    THERAPY DIAG:  Chronic pain of both knees  Muscle weakness (generalized)  Unsteadiness on feet  Difficulty in walking, not elsewhere classified  Rationale for Evaluation and Treatment: Rehabilitation  ONSET DATE: Reports 6 weeks ago Oct 2024  SUBJECTIVE:   SUBJECTIVE STATEMENT: Pt reports that she is doing well. Reports children from [place] drove into town 30 minutes prior to leaving for PT treatment.  States that she has had some soreness in the R thigh, tender the touch and tight while sitting in car.  States that she will be moving in with mother at start of the new year.   Pt accompanied by: self  PERTINENT HISTORY:  Pt reports onset of knee instability starting approx 6 weeks ago with first of three falls. Pt report she fell when foot turned out while taking out trash, fell down three steps. Second fall occurred when she tripped over her dog. Third fall occurred tripping over a christmas present. Pt notes inability to stand up/decreased strength in BLE, and that  she had to pull herself up using her arms. Has had BUE/BLE pain since falls, has had increased difficulty lifting BUE since falls. She reports she does not feel dizzy when she falls, she is tripping and her knees feel unstable. She reports she was diagnosed with sprained R ankle, but that this has healed nicely.  Reports RLE is weaker than LLE. She wants to improve BLE strength, and has difficulty with the following: getting up from chairs, navigating steps/stairs, getting into bed, ambulating for long periods of time (knees buckle around 15-20 min), standing limited to 10-15 min. She reports lack of balance reaction when she falls. She has noticed she tends to fall to R side. Has poor balance in the dark/avoids walking in the dark. Reports hx of arthritis, hip bursitis.  PMH significant for arthritis, Bipolar 1 disorder, HTN, sleep apnea, HLD, reports hx of R ankle sprain  PRECAUTIONS: Fall  RED FLAGS: None   WEIGHT BEARING RESTRICTIONS: No  PAIN:  Are you having pain? Yes: NPRS scale: 2-3/10 Pain location: R thigh.  Pain description:  ache Aggravating factors: sitting for a long time, staying in bed for a long time, prolonged walking Relieving factors: Taking Tylenol 2x/day - takes the edge off pain  FALLS: Has patient fallen in last 6 months? Yes. Number of falls 3 (occurred last 6 weeks to 2 months)  LIVING ENVIRONMENT: Lives with: lives with their spouse Lives in: House/apartment Stairs: Yes: External: 3 steps; bilateral  Has following equipment at home: Single point cane, shower chair, and Grab bars  PLOF: Independent  PATIENT GOALS: Want to walk on my treadmill again, wants to walk outside, be mobile without being scared    NEXT MD VISIT: reports no follow-up currently scheduled. PT recommends follow-up due to ongoing, worsening BUE pain with limited AROM, pt agreeable to this plan to reach out to physician.   OBJECTIVE:  Note: Objective measures were completed at Evaluation  unless otherwise noted.  DIAGNOSTIC FINDINGS:   Via chart 03/21/2023 DG FOOT Complete Right " FINDINGS: Borderline hallux valgus. Minimal chronic enthesopathic change at the Achilles insertion on the calcaneus. Joint spaces are preserved. No acute fracture or dislocation.   IMPRESSION: 1. No acute fracture or dislocation. 2. Borderline hallux valgus.     Electronically Signed   By: Neita Garnet M.D.   On: 03/21/2023 08:59"  PATIENT SURVEYS:  LEFS 22/80 FOTO 40 (goal score 56)    COGNITION: Overall cognitive status: Within functional limits for tasks assessed     SENSATION: Reports no n/t,  Formal testing deferred to future visit  EDEMA:  Reports no swelling   POSTURE:   elevated shoulder bilaterally, increased thoracic kyphosis     LOWER EXTREMITY MMT: Pain limited with hip flexion bilat, pain limited adduction, and knee ext/flex bilat BLE strength grossly 4+/5     FUNCTIONAL TESTS:  5 times sit to stand: 14 sec and bilat knee felt unstable/like they would buckle 10 meter walk test: 0.99 m/s and antalgic,  top of L foot is cramping, increased LTL sway throughout MCTSIB: completes condition 1, 3, unable to complete condition 2 and 4 BERG - deferred - deferred   GAIT: Distance walked: appears antalgic, see above    TODAY'S TREATMENT:                                                                                                                              DATE: 05/31/23  Nustep: BLE/BUE x 5 min level 1 x 1 min, level 2 x 4 min, level 1 x 1 min.   Airex pad:  Normal BOS 2 x 30 sec  Narrow BOS 2 x 30 sec  Rocker board AP control 2 x 30 sec without UE support  AP rocks x 20 each with BUE support  Lateral control 2 x 30 sec without UE support  Lateral rocks x 20 bil  with BUE support   Sit<>stand 2 x 11 without UE support.   Stepping over bolster x 12 bil with UE supported in rails.    CGA for safety throughout treatment and min cues  for use of ankle strategy to correct LOB   PT performed STM with therastick to the R thigh/quad region x 8 min for TP release and pain management   Pt reports Bil knees and thighs feeling tired and "wobbly" following blocked practice sit<>stand   PATIENT EDUCATION:  Education details: assessment findings, HEP, plan Person educated: Patient Education method: Explanation, Demonstration, Verbal cues, and Handouts Education comprehension: verbalized understanding, returned demonstration, and needs further education  HOME EXERCISE PROGRAM:  Access Code: [facility] URL: [email or web address] Date: 05/17/2023 Prepared by: Temple Pacini  Exercises - Seated March  - 1 x daily - 5-6 x weekly - 3 sets - 10 reps - Seated Hip Abduction  - 1 x daily - 5-6 x weekly - 2 sets - 10 reps  Access Code: [email or web address] URL: [email or web address] Date: 05/21/2023 Prepared by: Temple Pacini  Exercises - Standing Single Leg Stance with Counter Support  - 1 x daily - 7 x weekly - 2 sets - 1 reps - 30 sec/leg hold - Standing Balance in Corner with Eyes Closed  - 1 x daily - 7 x weekly - 4 sets - 1 reps - 30 seconds hold - Standing Tandem Balance with Counter Support  - 1 x daily - 7 x weekly - 2 sets - 1 reps - 30 seconds/leg   ASSESSMENT:  CLINICAL IMPRESSION: Pt arrived motivated to participate. PT treatment focused on continued balance training with unstable surface. Pt demonstrates improving use of ankle strategy to correct LOB in all planes with increased difficulty with AP control compared to lateral control. Fatigue in Bil knees reported following blocked practice sit<>stand requiring UE support for stepping over bolster. Reports decreaesd pain in the R thigh following manual therapy.   The pt will benefit from further skilled PT to address these deficits in order to decrease fall risk and  increase QOL/ease with ADLs.  OBJECTIVE IMPAIRMENTS: Abnormal gait, decreased activity  tolerance, decreased balance, decreased endurance, decreased mobility, difficulty walking, decreased strength, impaired UE functional use, improper body mechanics, postural dysfunction, and pain.   ACTIVITY LIMITATIONS: carrying, lifting, bending, standing, squatting, stairs, transfers, bed mobility, reach over head, and locomotion level  PARTICIPATION LIMITATIONS: meal prep, cleaning, laundry, shopping, community activity, and yard work  PERSONAL FACTORS: Age, Sex, and 3+ comorbidities: PMH significant for arthritis, Bipolar 1 disorder, HTN, sleep apnea, HLD, reports hx of R ankle sprain  are also affecting patient's functional outcome.   REHAB POTENTIAL: Good  CLINICAL DECISION MAKING: Evolving/moderate complexity  EVALUATION COMPLEXITY: Moderate   GOALS: Goals reviewed with patient? Yes   SHORT TERM GOALS: Target date: 06/14/2023    Patient will be independent in home exercise program to improve strength/mobility for better functional independence with ADLs. Baseline:initiated Goal status: INITIAL   LONG TERM GOALS: Target date: 07/12/2023    Patient will increase FOTO score to equal to or greater than 56    to demonstrate statistically significant improvement in mobility and quality of life.  Baseline: 40 Goal status: INITIAL  2.  Patient  will complete five times sit to stand test in < 10 seconds indicating an increased LE strength and improved balance. Baseline: 14 seconds Goal status: INITIAL  3.  Patient will increase Berg Balance score by > 6 points to demonstrate decreased fall risk during functional activities Baseline: 52 Goal status: INITIAL  4.  Patient will increase 10 meter walk test to >1.2 m/s as to improve gait speed for better community ambulation. Baseline: 0.99 m/s Goal status: INITIAL  5.  Patient will complete at least 15 seconds EC on firm and compliant surfaces without LOB to demonstrate improved ability to sustain balance in dark/dim  environments. Baseline: unable to perform without LOB Goal status: INITIAL  6.  Patient will increase lower extremity functional scale to >60/80 to demonstrate improved functional mobility and increased tolerance with ADLs.   Baseline: 22/80 Goal status: INITIAL  7. Patient will increase six minute walk test distance to >1200 ft for progression to community ambulator and improve gait ability  Baseline:1105 ft with slight cramping that resolved with rest   Goal Status: INITIAL/REVISED      PLAN:  PT FREQUENCY: 1-2x/week  PT DURATION: 8 weeks  PLANNED INTERVENTIONS: 97164- PT Re-evaluation, 97110-Therapeutic exercises, 97530- Therapeutic activity, 97112- Neuromuscular re-education, 97535- Self Care, 63875- Manual therapy, [phone]- Gait training, [phone]- Orthotic Fit/training, [phone]- Canalith repositioning, [phone]- Splinting, Patient/Family education, Balance training, Stair training, Taping, Dry Needling, Joint mobilization, Spinal mobilization, Vestibular training, Visual/preceptual remediation/compensation, DME instructions, Cryotherapy, and Moist heat  PLAN FOR NEXT SESSION:    update HEP as needed, balance, strength   Golden Pop, PT 05/31/2023, 1:49 PM

## 2023-06-03 ENCOUNTER — Encounter: Payer: Self-pay | Admitting: Family Medicine

## 2023-06-03 DIAGNOSIS — M25511 Pain in right shoulder: Secondary | ICD-10-CM

## 2023-06-05 ENCOUNTER — Ambulatory Visit: Payer: Medicare HMO

## 2023-06-05 DIAGNOSIS — R2681 Unsteadiness on feet: Secondary | ICD-10-CM

## 2023-06-05 DIAGNOSIS — W19XXXA Unspecified fall, initial encounter: Secondary | ICD-10-CM | POA: Diagnosis not present

## 2023-06-05 DIAGNOSIS — M2350 Chronic instability of knee, unspecified knee: Secondary | ICD-10-CM | POA: Diagnosis not present

## 2023-06-05 DIAGNOSIS — M6281 Muscle weakness (generalized): Secondary | ICD-10-CM

## 2023-06-05 DIAGNOSIS — G8929 Other chronic pain: Secondary | ICD-10-CM | POA: Diagnosis not present

## 2023-06-05 DIAGNOSIS — M25562 Pain in left knee: Secondary | ICD-10-CM | POA: Diagnosis not present

## 2023-06-05 DIAGNOSIS — R29898 Other symptoms and signs involving the musculoskeletal system: Secondary | ICD-10-CM | POA: Diagnosis not present

## 2023-06-05 DIAGNOSIS — R262 Difficulty in walking, not elsewhere classified: Secondary | ICD-10-CM | POA: Diagnosis not present

## 2023-06-05 DIAGNOSIS — M25561 Pain in right knee: Secondary | ICD-10-CM | POA: Diagnosis not present

## 2023-06-05 NOTE — Therapy (Signed)
OUTPATIENT PHYSICAL THERAPY LOWER EXTREMITY TREATMENT   Patient Name: MURDIS HOLEMAN MRN: [id number] DOB:09-01-1960, 62 y.o., female Today's Date: 06/05/2023  END OF SESSION:  PT End of Session - 06/05/23 0915     Visit Number 4    Number of Visits 17    Date for PT Re-Evaluation 07/12/23    PT Start Time 0924    PT Stop Time 1005    PT Time Calculation (min) 41 min    Equipment Utilized During Treatment Gait belt    Activity Tolerance Patient tolerated treatment well;No increased pain    Behavior During Therapy WFL for tasks assessed/performed              Past Medical History:  Diagnosis Date   Arthritis    osteo   Bipolar 1 disorder (HCC)    Depression    GERD (gastroesophageal reflux disease)    Hypertension    PONV (postoperative nausea and vomiting)    Sleep apnea    CPAP   Past Surgical History:  Procedure Laterality Date   CATARACT EXTRACTION W/PHACO Right 09/11/2022   Procedure: CATARACT EXTRACTION PHACO AND INTRAOCULAR LENS PLACEMENT (IOC) RIGHT  2.97  00:21.1;  Surgeon: Nevada Crane, MD;  Location: [facility];  Service: Ophthalmology;  Laterality: Right;   CATARACT EXTRACTION W/PHACO Left 09/25/2022   Procedure: CATARACT EXTRACTION PHACO AND INTRAOCULAR LENS PLACEMENT (IOC) LEFT  5.35  00:34.4;  Surgeon: Nevada Crane, MD;  Location: [facility];  Service: Ophthalmology;  Laterality: Left;   CERVICAL BIOPSY  W/ LOOP ELECTRODE EXCISION  2016   COLONOSCOPY WITH PROPOFOL N/A 11/11/2021   Procedure: COLONOSCOPY WITH PROPOFOL;  Surgeon: Toney Reil, MD;  Location: [facility];  Service: Gastroenterology;  Laterality: N/A;   ESOPHAGOGASTRODUODENOSCOPY (EGD) WITH PROPOFOL N/A 10/27/2019   Procedure: ESOPHAGOGASTRODUODENOSCOPY (EGD) WITH PROPOFOL;  Surgeon: Pasty Spillers, MD;  Location: [facility] ENDOSCOPY;  Service: Endoscopy;  Laterality: N/A;   TUBAL LIGATION     Patient Active Problem List   Diagnosis Date Noted    Pain and swelling of eyelid of right eye 04/10/2023   Acute foot pain, right 03/21/2023   Fall 03/21/2023   Preventative health care 07/25/2022   History of colonic polyps    Cecal polyp    Prediabetes 07/22/2021   Other fatigue 06/02/2021   Eczema 07/09/2020   Balance problem 10/15/2019   Nausea 05/27/2019   GERD (gastroesophageal reflux disease) 10/09/2018   Screening for cervical cancer 08/01/2018   Medicare annual wellness visit, subsequent 05/13/2018   Hyperlipidemia 05/08/2017   Essential hypertension 05/08/2017   Bipolar disorder (HCC) 05/08/2017   Rosacea 05/08/2017    PCP: Vernona Rieger Km NP  REFERRING PROVIDER:   Hannah Beat, MD    REFERRING DIAG:  Diagnosis  M25.561,M25.562,G89.29 (ICD-10-CM) - Bilateral chronic knee pain  M23.50 (ICD-10-CM) - Recurrent instability of knee joint, unspecified laterality  R29.898 (ICD-10-CM) - Leg weakness, bilateral  W19.Lorne Skeens (ICD-10-CM) - Fall, initial encounter    THERAPY DIAG:  Muscle weakness (generalized)  Unsteadiness on feet  Rationale for Evaluation and Treatment: Rehabilitation  ONSET DATE: Reports 6 weeks ago Oct 2024  SUBJECTIVE:   SUBJECTIVE STATEMENT: Pt reports she's been more active lately, but still wobbly at times. Notices this the most with going up and down the steps or with bending down (felt most when trying to stand back up). She notes walking for longer distances is easier. She reports ongoing R shoulder pain. She is going to see physician  after xmas to have shoulder looked at and may be getting PT for this.   Pt accompanied by: self  PERTINENT HISTORY:  Pt reports onset of knee instability starting approx 6 weeks ago with first of three falls. Pt report she fell when foot turned out while taking out trash, fell down three steps. Second fall occurred when she tripped over her dog. Third fall occurred tripping over a christmas present. Pt notes inability to stand up/decreased strength in  BLE, and that she had to pull herself up using her arms. Has had BUE/BLE pain since falls, has had increased difficulty lifting BUE since falls. She reports she does not feel dizzy when she falls, she is tripping and her knees feel unstable. She reports she was diagnosed with sprained R ankle, but that this has healed nicely.  Reports RLE is weaker than LLE. She wants to improve BLE strength, and has difficulty with the following: getting up from chairs, navigating steps/stairs, getting into bed, ambulating for long periods of time (knees buckle around 15-20 min), standing limited to 10-15 min. She reports lack of balance reaction when she falls. She has noticed she tends to fall to R side. Has poor balance in the dark/avoids walking in the dark. Reports hx of arthritis, hip bursitis.  PMH significant for arthritis, Bipolar 1 disorder, HTN, sleep apnea, HLD, reports hx of R ankle sprain  PRECAUTIONS: Fall  RED FLAGS: None   WEIGHT BEARING RESTRICTIONS: No  PAIN:  Are you having pain? Yes: NPRS scale: 2-3/10 Pain location: R thigh.  Pain description:  ache Aggravating factors: sitting for a long time, staying in bed for a long time, prolonged walking Relieving factors: Taking Tylenol 2x/day - takes the edge off pain  FALLS: Has patient fallen in last 6 months? Yes. Number of falls 3 (occurred last 6 weeks to 2 months)  LIVING ENVIRONMENT: Lives with: lives with their spouse Lives in: House/apartment Stairs: Yes: External: 3 steps; bilateral  Has following equipment at home: Single point cane, shower chair, and Grab bars  PLOF: Independent  PATIENT GOALS: Want to walk on my treadmill again, wants to walk outside, be mobile without being scared    NEXT MD VISIT: reports no follow-up currently scheduled. PT recommends follow-up due to ongoing, worsening BUE pain with limited AROM, pt agreeable to this plan to reach out to physician.   OBJECTIVE:  Note: Objective measures were completed  at Evaluation unless otherwise noted.  DIAGNOSTIC FINDINGS:   Via chart 03/21/2023 DG FOOT Complete Right " FINDINGS: Borderline hallux valgus. Minimal chronic enthesopathic change at the Achilles insertion on the calcaneus. Joint spaces are preserved. No acute fracture or dislocation.   IMPRESSION: 1. No acute fracture or dislocation. 2. Borderline hallux valgus.     Electronically Signed   By: Neita Garnet M.D.   On: 03/21/2023 08:59"  PATIENT SURVEYS:  LEFS 22/80 FOTO 40 (goal score 56)    COGNITION: Overall cognitive status: Within functional limits for tasks assessed     SENSATION: Reports no n/t,  Formal testing deferred to future visit  EDEMA:  Reports no swelling   POSTURE:   elevated shoulder bilaterally, increased thoracic kyphosis     LOWER EXTREMITY MMT: Pain limited with hip flexion bilat, pain limited adduction, and knee ext/flex bilat BLE strength grossly 4+/5     FUNCTIONAL TESTS:  5 times sit to stand: 14 sec and bilat knee felt unstable/like they would buckle 10 meter walk test: 0.99 m/s and antalgic, top  of L foot is cramping, increased LTL sway throughout MCTSIB: completes condition 1, 3, unable to complete condition 2 and 4 BERG - deferred - deferred   GAIT: Distance walked: appears antalgic, see above    TODAY'S TREATMENT:                                                                                                                              DATE: 06/05/23  Gait belt donned throughout to ensure pt safety   NMR: Step-up and down from 6" step x multiple reps, with UE support to no UE support. Feels instability mostly R side, pt requires UE support to correct for one instance of LOB.   One foot on floor, one on 6" step x 30 sec each LE  SLB 2x30 sec each LE   Airex beam tandem gait and LTL stepping x multiple reps of each -intermittent UE support throughout  Over 40 ft - tandem-gait x 2 rounds - rates hard.  Uses of step-strategy throughout   Slow march over 40 ft x 2 rounds - rates moderate   At support surface Airex NBOS EO x 30 sec Airex WBOS EC 4x30 sec - up to min assist provided  Airex WBOS vertical and horizontal head turns 2x10 of each   TE:  Sit<>stand 1x8. Reports with 4th reps bilat knees felt tight and unsteady, reports more difficult to push-up to stand  LAQ 2x15 each LE - Feels cramp-like sensation behind L knee  Standing hamstring curl 2x15 each LE   Standing heel raise 2x15 - felt in hamstrings, rates easy but does exhibit fatigue   Standing hip abd 2x12-15 each LE  STS 2x5      PATIENT EDUCATION:  Education details:exercise technique Person educated: Patient Education method: Explanation, Demonstration, and Verbal cues Education comprehension: verbalized understanding, returned demonstration, and needs further education  HOME EXERCISE PROGRAM:  Access Code: [email or web address] URL: [email or web address] Date: 05/17/2023 Prepared by: Temple Pacini  Exercises - Seated March  - 1 x daily - 5-6 x weekly - 3 sets - 10 reps - Seated Hip Abduction  - 1 x daily - 5-6 x weekly - 2 sets - 10 reps  Access Code: [email or web address] URL: [email or web address] Date: 05/21/2023 Prepared by: Temple Pacini  Exercises - Standing Single Leg Stance with Counter Support  - 1 x daily - 7 x weekly - 2 sets - 1 reps - 30 sec/leg hold - Standing Balance in Corner with Eyes Closed  - 1 x daily - 7 x weekly - 4 sets - 1 reps - 30 seconds hold - Standing Tandem Balance with Counter Support  - 1 x daily - 7 x weekly - 2 sets - 1 reps - 30 seconds/leg   ASSESSMENT:  CLINICAL IMPRESSION: Pt highly motivated to participate in session. She tolerates interventions well without significant fatigue or soreness. She notes majority of interventions felt in hamstrings, described as a soreness. Pt generally required  CGA-min a throughout, where she needed most support with dynamic and  compliant surface balance activities.  The pt will benefit from further skilled PT to address these deficits in order to decrease fall risk and increase QOL/ease with ADLs.  OBJECTIVE IMPAIRMENTS: Abnormal gait, decreased activity tolerance, decreased balance, decreased endurance, decreased mobility, difficulty walking, decreased strength, impaired UE functional use, improper body mechanics, postural dysfunction, and pain.   ACTIVITY LIMITATIONS: carrying, lifting, bending, standing, squatting, stairs, transfers, bed mobility, reach over head, and locomotion level  PARTICIPATION LIMITATIONS: meal prep, cleaning, laundry, shopping, community activity, and yard work  PERSONAL FACTORS: Age, Sex, and 3+ comorbidities: PMH significant for arthritis, Bipolar 1 disorder, HTN, sleep apnea, HLD, reports hx of R ankle sprain  are also affecting patient's functional outcome.   REHAB POTENTIAL: Good  CLINICAL DECISION MAKING: Evolving/moderate complexity  EVALUATION COMPLEXITY: Moderate   GOALS: Goals reviewed with patient? Yes   SHORT TERM GOALS: Target date: 06/14/2023    Patient will be independent in home exercise program to improve strength/mobility for better functional independence with ADLs. Baseline:initiated Goal status: INITIAL   LONG TERM GOALS: Target date: 07/12/2023    Patient will increase FOTO score to equal to or greater than 56    to demonstrate statistically significant improvement in mobility and quality of life.  Baseline: 40 Goal status: INITIAL  2.  Patient  will complete five times sit to stand test in < 10 seconds indicating an increased LE strength and improved balance. Baseline: 14 seconds Goal status: INITIAL  3.  Patient will increase Berg Balance score by > 6 points to demonstrate decreased fall risk during functional activities Baseline: 52 Goal status: INITIAL  4.  Patient will increase 10 meter walk test to >1.2 m/s as to improve gait speed for better  community ambulation. Baseline: 0.99 m/s Goal status: INITIAL  5.  Patient will complete at least 15 seconds EC on firm and compliant surfaces without LOB to demonstrate improved ability to sustain balance in dark/dim environments. Baseline: unable to perform without LOB Goal status: INITIAL  6.  Patient will increase lower extremity functional scale to >60/80 to demonstrate improved functional mobility and increased tolerance with ADLs.   Baseline: 22/80 Goal status: INITIAL  7. Patient will increase six minute walk test distance to >1200 ft for progression to community ambulator and improve gait ability  Baseline:1105 ft with slight cramping that resolved with rest   Goal Status: INITIAL/REVISED      PLAN:  PT FREQUENCY: 1-2x/week  PT DURATION: 8 weeks  PLANNED INTERVENTIONS: 97164- PT Re-evaluation, 97110-Therapeutic exercises, 97530- Therapeutic activity, 97112- Neuromuscular re-education, 97535- Self Care, 16109- Manual therapy, [phone]- Gait training, [phone]- Orthotic Fit/training, [phone]- Canalith repositioning, [phone]- Splinting, Patient/Family education, Balance training, Stair training, Taping, Dry Needling, Joint mobilization, Spinal mobilization, Vestibular training, Visual/preceptual remediation/compensation, DME instructions, Cryotherapy, and Moist heat  PLAN FOR NEXT SESSION:    update HEP as needed, balance, strength   Baird Kay, PT 06/05/2023, 10:07 AM

## 2023-06-06 DIAGNOSIS — G4733 Obstructive sleep apnea (adult) (pediatric): Secondary | ICD-10-CM | POA: Diagnosis not present

## 2023-06-09 ENCOUNTER — Encounter: Payer: Self-pay | Admitting: Family Medicine

## 2023-06-12 ENCOUNTER — Ambulatory Visit: Payer: Medicare HMO

## 2023-06-12 DIAGNOSIS — M25562 Pain in left knee: Secondary | ICD-10-CM | POA: Diagnosis not present

## 2023-06-12 DIAGNOSIS — G8929 Other chronic pain: Secondary | ICD-10-CM | POA: Diagnosis not present

## 2023-06-12 DIAGNOSIS — M2350 Chronic instability of knee, unspecified knee: Secondary | ICD-10-CM | POA: Diagnosis not present

## 2023-06-12 DIAGNOSIS — M6281 Muscle weakness (generalized): Secondary | ICD-10-CM

## 2023-06-12 DIAGNOSIS — R29898 Other symptoms and signs involving the musculoskeletal system: Secondary | ICD-10-CM | POA: Diagnosis not present

## 2023-06-12 DIAGNOSIS — W19XXXA Unspecified fall, initial encounter: Secondary | ICD-10-CM | POA: Diagnosis not present

## 2023-06-12 DIAGNOSIS — R262 Difficulty in walking, not elsewhere classified: Secondary | ICD-10-CM | POA: Diagnosis not present

## 2023-06-12 DIAGNOSIS — M25561 Pain in right knee: Secondary | ICD-10-CM | POA: Diagnosis not present

## 2023-06-12 NOTE — Therapy (Signed)
 OUTPATIENT PHYSICAL THERAPY LOWER EXTREMITY TREATMENT/DISCHARGE   Patient Name: Katherine Carpenter MRN: [id number] DOB:August 16, 1960, 62 y.o., female Today's Date: 06/12/2023  END OF SESSION:  PT End of Session - 06/12/23 0801     Visit Number 5    Number of Visits 17    Date for PT Re-Evaluation 07/12/23    PT Start Time 0804    PT Stop Time 0840    PT Time Calculation (min) 36 min    Equipment Utilized During Treatment Gait belt    Activity Tolerance Patient tolerated treatment well;No increased pain    Behavior During Therapy WFL for tasks assessed/performed              Past Medical History:  Diagnosis Date   Arthritis    osteo   Bipolar 1 disorder (HCC)    Depression    GERD (gastroesophageal reflux disease)    Hypertension    PONV (postoperative nausea and vomiting)    Sleep apnea    CPAP   Past Surgical History:  Procedure Laterality Date   CATARACT EXTRACTION W/PHACO Right 09/11/2022   Procedure: CATARACT EXTRACTION PHACO AND INTRAOCULAR LENS PLACEMENT (IOC) RIGHT  2.97  00:21.1;  Surgeon: Myrna Adine Anes, MD;  Location: [facility];  Service: Ophthalmology;  Laterality: Right;   CATARACT EXTRACTION W/PHACO Left 09/25/2022   Procedure: CATARACT EXTRACTION PHACO AND INTRAOCULAR LENS PLACEMENT (IOC) LEFT  5.35  00:34.4;  Surgeon: Myrna Adine Anes, MD;  Location: [facility];  Service: Ophthalmology;  Laterality: Left;   CERVICAL BIOPSY  W/ LOOP ELECTRODE EXCISION  2016   COLONOSCOPY WITH PROPOFOL  N/A 11/11/2021   Procedure: COLONOSCOPY WITH PROPOFOL ;  Surgeon: Unk Corinn Skiff, MD;  Location: [facility];  Service: Gastroenterology;  Laterality: N/A;   ESOPHAGOGASTRODUODENOSCOPY (EGD) WITH PROPOFOL  N/A 10/27/2019   Procedure: ESOPHAGOGASTRODUODENOSCOPY (EGD) WITH PROPOFOL ;  Surgeon: Janalyn Keene KATHEE, MD;  Location: [facility] ENDOSCOPY;  Service: Endoscopy;  Laterality: N/A;   TUBAL LIGATION     Patient Active Problem List   Diagnosis Date  Noted   Pain and swelling of eyelid of right eye 04/10/2023   Acute foot pain, right 03/21/2023   Fall 03/21/2023   Preventative health care 07/25/2022   History of colonic polyps    Cecal polyp    Prediabetes 07/22/2021   Other fatigue 06/02/2021   Eczema 07/09/2020   Balance problem 10/15/2019   Nausea 05/27/2019   GERD (gastroesophageal reflux disease) 10/09/2018   Screening for cervical cancer 08/01/2018   Medicare annual wellness visit, subsequent 05/13/2018   Hyperlipidemia 05/08/2017   Essential hypertension 05/08/2017   Bipolar disorder (HCC) 05/08/2017   Rosacea 05/08/2017    PCP: Gretta crank Km NP  REFERRING PROVIDER:   Watt Mirza, MD    REFERRING DIAG:  Diagnosis  M25.561,M25.562,G89.29 (ICD-10-CM) - Bilateral chronic knee pain  M23.50 (ICD-10-CM) - Recurrent instability of knee joint, unspecified laterality  R29.898 (ICD-10-CM) - Leg weakness, bilateral  W19.CHERENE (ICD-10-CM) - Fall, initial encounter    THERAPY DIAG:  Muscle weakness (generalized)  Rationale for Evaluation and Treatment: Rehabilitation  ONSET DATE: Reports 6 weeks ago Oct 2024  SUBJECTIVE:   SUBJECTIVE STATEMENT: Pt reports her pain level has been very minimal. She reports OA and bursitis in R shoulder, has been using arthritis cream and it is really helping with her UE pain. Pt reports she has not felt as wobbly this week. Does not feel she needs PT for shoulder at this time. Pt also feels ready for d/c from  PT for LE.  Pt accompanied by: self  PERTINENT HISTORY:  Pt reports onset of knee instability starting approx 6 weeks ago with first of three falls. Pt report she fell when foot turned out while taking out trash, fell down three steps. Second fall occurred when she tripped over her dog. Third fall occurred tripping over a christmas present. Pt notes inability to stand up/decreased strength in BLE, and that she had to pull herself up using her arms. Has had BUE/BLE pain  since falls, has had increased difficulty lifting BUE since falls. She reports she does not feel dizzy when she falls, she is tripping and her knees feel unstable. She reports she was diagnosed with sprained R ankle, but that this has healed nicely.  Reports RLE is weaker than LLE. She wants to improve BLE strength, and has difficulty with the following: getting up from chairs, navigating steps/stairs, getting into bed, ambulating for long periods of time (knees buckle around 15-20 min), standing limited to 10-15 min. She reports lack of balance reaction when she falls. She has noticed she tends to fall to R side. Has poor balance in the dark/avoids walking in the dark. Reports hx of arthritis, hip bursitis.  PMH significant for arthritis, Bipolar 1 disorder, HTN, sleep apnea, HLD, reports hx of R ankle sprain  PRECAUTIONS: Fall  RED FLAGS: None   WEIGHT BEARING RESTRICTIONS: No  PAIN:  Are you having pain? Yes: NPRS scale: 2-3/10 Pain location: R thigh.  Pain description:  ache Aggravating factors: sitting for a long time, staying in bed for a long time, prolonged walking Relieving factors: Taking Tylenol  2x/day - takes the edge off pain  FALLS: Has patient fallen in last 6 months? Yes. Number of falls 3 (occurred last 6 weeks to 2 months)  LIVING ENVIRONMENT: Lives with: lives with their spouse Lives in: House/apartment Stairs: Yes: External: 3 steps; bilateral  Has following equipment at home: Single point cane, shower chair, and Grab bars  PLOF: Independent  PATIENT GOALS: Want to walk on my treadmill again, wants to walk outside, be mobile without being scared    NEXT MD VISIT: reports no follow-up currently scheduled. PT recommends follow-up due to ongoing, worsening BUE pain with limited AROM, pt agreeable to this plan to reach out to physician.   OBJECTIVE:  Note: Objective measures were completed at Evaluation unless otherwise noted.  DIAGNOSTIC FINDINGS:   Via  chart 03/21/2023 DG FOOT Complete Right  FINDINGS: Borderline hallux valgus. Minimal chronic enthesopathic change at the Achilles insertion on the calcaneus. Joint spaces are preserved. No acute fracture or dislocation.   IMPRESSION: 1. No acute fracture or dislocation. 2. Borderline hallux valgus.     Electronically Signed   By: Tanda Lyons M.D.   On: 03/21/2023 08:59  PATIENT SURVEYS:  LEFS 22/80 FOTO 40 (goal score 56)    COGNITION: Overall cognitive status: Within functional limits for tasks assessed     SENSATION: Reports no n/t,  Formal testing deferred to future visit  EDEMA:  Reports no swelling   POSTURE:   elevated shoulder bilaterally, increased thoracic kyphosis     LOWER EXTREMITY MMT: Pain limited with hip flexion bilat, pain limited adduction, and knee ext/flex bilat BLE strength grossly 4+/5     FUNCTIONAL TESTS:  5 times sit to stand: 14 sec and bilat knee felt unstable/like they would buckle 10 meter walk test: 0.99 m/s and antalgic, top of L foot is cramping, increased LTL sway throughout MCTSIB: completes condition  1, 3, unable to complete condition 2 and 4 BERG - deferred - deferred   GAIT: Distance walked: appears antalgic, see above    TODAY'S TREATMENT:                                                                                                                              DATE: 06/12/23   TA: Retesting of goals completed this visit. Please refer to goal section and assessment below for details.       PATIENT EDUCATION:  Education journalist, newspaper Person educated: Patient Education method: Programmer, Multimedia, Demonstration, and Verbal cues Education comprehension: verbalized understanding, returned demonstration, and needs further education  HOME EXERCISE PROGRAM:  Access Code: [email or web address] URL: [email or web address] Date: 05/17/2023 Prepared by: Darryle Patten  Exercises - Seated March   - 1 x daily - 5-6 x weekly - 3 sets - 10 reps - Seated Hip Abduction  - 1 x daily - 5-6 x weekly - 2 sets - 10 reps  Access Code: [email or web address] URL: [email or web address] Date: 05/21/2023 Prepared by: Darryle Patten  Exercises - Standing Single Leg Stance with Counter Support  - 1 x daily - 7 x weekly - 2 sets - 1 reps - 30 sec/leg hold - Standing Balance in Corner with Eyes Closed  - 1 x daily - 7 x weekly - 4 sets - 1 reps - 30 seconds hold - Standing Tandem Balance with Counter Support  - 1 x daily - 7 x weekly - 2 sets - 1 reps - 30 seconds/leg   ASSESSMENT:  CLINICAL IMPRESSION: Pt has now met 6/7 LTG and partially met remaining LTG, indicating improvement in LE mobility/function, pain, strength, gait and balance. Pt agreeable to plan to d/c today.  PT reviewed importance of continuing aspects of HEP beyond d/c to maintain and continue gains. Pt verbalized understanding. The pt does not require further skilled LE PT at this time.   OBJECTIVE IMPAIRMENTS: Abnormal gait, decreased activity tolerance, decreased balance, decreased endurance, decreased mobility, difficulty walking, decreased strength, impaired UE functional use, improper body mechanics, postural dysfunction, and pain.   ACTIVITY LIMITATIONS: carrying, lifting, bending, standing, squatting, stairs, transfers, bed mobility, reach over head, and locomotion level  PARTICIPATION LIMITATIONS: meal prep, cleaning, laundry, shopping, community activity, and yard work  PERSONAL FACTORS: Age, Sex, and 3+ comorbidities: PMH significant for arthritis, Bipolar 1 disorder, HTN, sleep apnea, HLD, reports hx of R ankle sprain  are also affecting patient's functional outcome.   REHAB POTENTIAL: Good  CLINICAL DECISION MAKING: Evolving/moderate complexity  EVALUATION COMPLEXITY: Moderate   GOALS: Goals reviewed with patient? Yes   SHORT TERM GOALS: Target date: 06/14/2023    Patient will be independent in home exercise  program to improve strength/mobility for better functional independence with ADLs. Baseline:initiated; 12/31: Pt indep Goal status: MET   LONG TERM GOALS: Target date: 07/12/2023    Patient will increase FOTO score to equal to or greater than 56  to demonstrate statistically significant improvement in mobility and quality of life.  Baseline: 40; 12/31: 81  Goal status: MET  2.  Patient  will complete five times sit to stand test in < 10 seconds indicating an increased LE strength and improved balance. Baseline: 14 seconds; 12/31: 10.4 sec hands-free Goal status: PARTIALLY MET  3.  Patient will increase Berg Balance score by > 6 points to demonstrate decreased fall risk during functional activities Baseline: 52; 12/31: 56/56 Goal status: MET  4.  Patient will increase 10 meter walk test to >1.2 m/s as to improve gait speed for better community ambulation. Baseline: 0.99 m/s; 12/31:1.5 m/s  Goal status: MET  5.  Patient will complete at least 15 seconds EC on firm and compliant surfaces without LOB to demonstrate improved ability to sustain balance in dark/dim environments. Baseline: unable to perform without LOB; 12/31: completes both successfully  Goal status: MET  6.  Patient will increase lower extremity functional scale to >60/80 to demonstrate improved functional mobility and increased tolerance with ADLs.   Baseline: 22/80; 12/31: 72 Goal status: MET  7. Patient will increase six minute walk test distance to >1200 ft for progression to community ambulator and improve gait ability  Baseline:1105 ft with slight cramping that resolved with rest; 12/31: 1300 ft  Goal Status: MET     PLAN:  PT FREQUENCY: 1-2x/week  PT DURATION: 8 weeks  PLANNED INTERVENTIONS: 97164- PT Re-evaluation, 97110-Therapeutic exercises, 97530- Therapeutic activity, 97112- Neuromuscular re-education, 97535- Self Care, 02859- Manual therapy, [phone]- Gait training, [phone]- Orthotic Fit/training,  [phone]- Canalith repositioning, [phone]- Splinting, Patient/Family education, Balance training, Stair training, Taping, Dry Needling, Joint mobilization, Spinal mobilization, Vestibular training, Visual/preceptual remediation/compensation, DME instructions, Cryotherapy, and Moist heat  PLAN FOR NEXT SESSION:   Continue SLB and LE strengthening parts of HEP beyond PT to maintain/continue gains after discharge today   Darryle JONELLE Patten, PT 06/12/2023, 8:49 AM

## 2023-06-17 ENCOUNTER — Other Ambulatory Visit: Payer: Self-pay | Admitting: Primary Care

## 2023-06-17 DIAGNOSIS — I10 Essential (primary) hypertension: Secondary | ICD-10-CM

## 2023-06-19 ENCOUNTER — Ambulatory Visit: Payer: Medicare HMO

## 2023-06-20 ENCOUNTER — Ambulatory Visit: Payer: Medicare HMO | Admitting: Dermatology

## 2023-06-22 ENCOUNTER — Ambulatory Visit: Payer: Medicare HMO | Admitting: Physical Therapy

## 2023-06-25 ENCOUNTER — Ambulatory Visit (INDEPENDENT_AMBULATORY_CARE_PROVIDER_SITE_OTHER): Payer: Medicare HMO | Admitting: Dermatology

## 2023-06-25 ENCOUNTER — Encounter: Payer: Self-pay | Admitting: Dermatology

## 2023-06-25 DIAGNOSIS — L309 Dermatitis, unspecified: Secondary | ICD-10-CM | POA: Diagnosis not present

## 2023-06-25 DIAGNOSIS — Z7189 Other specified counseling: Secondary | ICD-10-CM

## 2023-06-25 DIAGNOSIS — L719 Rosacea, unspecified: Secondary | ICD-10-CM

## 2023-06-25 DIAGNOSIS — Z79899 Other long term (current) drug therapy: Secondary | ICD-10-CM

## 2023-06-25 MED ORDER — DOXYCYCLINE HYCLATE 20 MG PO TABS: 20.0000 mg | ORAL_TABLET | Freq: Two times a day (BID) | ORAL | 3 refills | Status: DC

## 2023-06-25 MED ORDER — METRONIDAZOLE 0.75 % EX GEL: CUTANEOUS | 6 refills | Status: DC

## 2023-06-25 MED ORDER — DUPIXENT 300 MG/2ML ~~LOC~~ SOAJ: SUBCUTANEOUS | 5 refills | Status: DC

## 2023-06-25 NOTE — Patient Instructions (Signed)

## 2023-06-25 NOTE — Progress Notes (Signed)
 Follow-Up Visit   Subjective  Katherine Carpenter is a 63 y.o. female who presents for the following: Rosacea And eczema follow up  Doing well with rosacea on metronidazole   No eczema flares currently staying clear she is currently on dupixent  and self injects at home. She took her last injection this past Saturday.   The following portions of the chart were reviewed this encounter and updated as appropriate: medications, allergies, medical history  Review of Systems:  No other skin or systemic complaints except as noted in HPI or Assessment and Plan.  Objective  Well appearing patient in no apparent distress; mood and affect are within normal limits.  Areas Examined: Face and legs  Relevant physical exam findings are noted in the Assessment and Plan.    Assessment & Plan   ROSACEA   Related Medications doxycycline  (PERIOSTAT ) 20 MG tablet Take 1 tablet (20 mg total) by mouth 2 (two) times daily with a meal. metroNIDAZOLE  (METROGEL ) 0.75 % gel APPLY APPLICATION TOPICALLY TWO (2) TIMES DAILY. ECZEMA, UNSPECIFIED TYPE   Related Medications Dupilumab  (DUPIXENT ) 300 MG/2ML SOAJ INJECT 1 PEN (300MG ) UNDER THE SKIN (SUBCUTANEOUS INJECTION) EVERY 14 DAYS LONG-TERM USE OF HIGH-RISK MEDICATION   COUNSELING AND COORDINATION OF CARE    ROSACEA  Exam:  Mid face erythema with telangiectasias, no inflamed papules  Chronic condition with duration or expected duration over one year. Currently well-controlled.   Rosacea is a chronic progressive skin condition usually affecting the face of adults, causing redness and/or acne bumps. It is treatable but not curable. It sometimes affects the eyes (ocular rosacea) as well. It may respond to topical and/or systemic medication and can flare with stress, sun exposure, alcohol, exercise, topical steroids (including hydrocortisone/cortisone 10) and some foods.  Daily application of broad spectrum spf 30+ sunscreen to face is recommended to  reduce flares.  Treatment Plan  Continue doxycycline  20 mg tab twice daily with food   Doxycycline  should be taken with food to prevent nausea. Do not lay down for 30 minutes after taking. Be cautious with sun exposure and use good sun protection while on this medication. Pregnant women should not take this medication.     Continue metronidazole  0.75 % gel - apply topically bid to affected areas of face.     Eczema, unspecified type legs  Exam: patient defers exam and reports no lesions  Chronic condition with duration or expected duration over one year. Currently well-controlled.   Has been on Dupixent  after severe disease for 15 years despite multiple other therapies. Patient would like to continue dupixent  therapy.    Continue Dupixent  300mg /75mL every 2 weeks. Sample given: Dupixent  300 mg/2 ml sopn, Lot [id number] Exp 02/09/2025 NDC [id number]    Dupilumab  (Dupixent ) is a treatment given by injection for adults and children with moderate-to-severe atopic dermatitis. Goal is control of skin condition, not cure. It is given as 2 injections at the first dose followed by 1 injection ever 2 weeks thereafter.  Young children are dosed monthly.   Potential side effects include allergic reaction, herpes infections, injection site reactions and conjunctivitis (inflammation of the eyes).  The use of Dupixent  requires long term medication management, including periodic office visits.  Long term medication management.  Patient is using long term (months to years) prescription medication  to control their dermatologic condition.  These medications require periodic monitoring to evaluate for efficacy and side effects.     Return in about 1 year (around 06/24/2024) for rosacea/ eczema follow up.  I, Eleanor Blush, CMA, am acting as scribe for Boneta Sharps, MD.   Documentation: I have reviewed the above documentation for accuracy and completeness, and I agree with the  above.  Boneta Sharps, MD

## 2023-06-26 ENCOUNTER — Encounter: Payer: Medicare HMO | Admitting: Physical Therapy

## 2023-07-07 DIAGNOSIS — G4733 Obstructive sleep apnea (adult) (pediatric): Secondary | ICD-10-CM | POA: Diagnosis not present

## 2023-07-17 ENCOUNTER — Other Ambulatory Visit: Payer: Self-pay

## 2023-07-17 DIAGNOSIS — L309 Dermatitis, unspecified: Secondary | ICD-10-CM

## 2023-07-17 MED ORDER — DUPIXENT 300 MG/2ML ~~LOC~~ SOAJ: SUBCUTANEOUS | 5 refills | Status: DC

## 2023-07-17 NOTE — Telephone Encounter (Signed)
Patient called to let us know [facility] pharmacy never recievdd her Dupixent rx   Ok called Dupixent into [facility].   Patient also would like a sample of Dupixent until she can get her rx. Patient will come by here Thursday to pick up 1 sample.

## 2023-07-31 ENCOUNTER — Encounter: Payer: Self-pay | Admitting: Primary Care

## 2023-07-31 ENCOUNTER — Ambulatory Visit (INDEPENDENT_AMBULATORY_CARE_PROVIDER_SITE_OTHER): Payer: Medicare HMO | Admitting: Primary Care

## 2023-07-31 VITALS — BP 136/88 | HR 105 | Temp 98.7°F | Ht 63.0 in | Wt 224.0 lb

## 2023-07-31 DIAGNOSIS — Z0000 Encounter for general adult medical examination without abnormal findings: Secondary | ICD-10-CM | POA: Diagnosis not present

## 2023-07-31 DIAGNOSIS — E785 Hyperlipidemia, unspecified: Secondary | ICD-10-CM

## 2023-07-31 DIAGNOSIS — R7303 Prediabetes: Secondary | ICD-10-CM

## 2023-07-31 DIAGNOSIS — F3178 Bipolar disorder, in full remission, most recent episode mixed: Secondary | ICD-10-CM

## 2023-07-31 DIAGNOSIS — L719 Rosacea, unspecified: Secondary | ICD-10-CM

## 2023-07-31 LAB — COMPREHENSIVE METABOLIC PANEL
ALT: 18 U/L (ref 0–35)
AST: 15 U/L (ref 0–37)
Albumin: 4.3 g/dL (ref 3.5–5.2)
Alkaline Phosphatase: 108 U/L (ref 39–117)
BUN: 28 mg/dL — ABNORMAL HIGH (ref 6–23)
CO2: 27 meq/L (ref 19–32)
Calcium: 9.1 mg/dL (ref 8.4–10.5)
Chloride: 101 meq/L (ref 96–112)
Creatinine, Ser: 0.84 mg/dL (ref 0.40–1.20)
GFR: 74.39 mL/min (ref >60.00)
Glucose, Bld: 98 mg/dL (ref 70–99)
Potassium: 4.5 meq/L (ref 3.5–5.1)
Sodium: 139 meq/L (ref 135–145)
Total Bilirubin: 0.2 mg/dL (ref 0.2–1.2)
Total Protein: 6.9 g/dL (ref 6.0–8.3)

## 2023-07-31 LAB — CBC
HCT: 41.3 % (ref 36.0–46.0)
Hemoglobin: 13.7 g/dL (ref 12.0–15.0)
MCHC: 33.2 g/dL (ref 30.0–36.0)
MCV: 88.8 fL (ref 78.0–100.0)
Platelets: 242 10*3/uL (ref 150.0–400.0)
RBC: 4.65 Mil/uL (ref 3.87–5.11)
RDW: 13.9 % (ref 11.5–15.5)
WBC: 7.6 10*3/uL (ref 4.0–10.5)

## 2023-07-31 LAB — VITAMIN B12: Vitamin B-12: 382 pg/mL (ref 211–911)

## 2023-07-31 LAB — LIPID PANEL
Cholesterol: 201 mg/dL — ABNORMAL HIGH (ref 0–200)
HDL: 60.2 mg/dL (ref >39.00)
LDL Cholesterol: 109 mg/dL — ABNORMAL HIGH (ref 0–99)
NonHDL: 140.73
Total CHOL/HDL Ratio: 3
Triglycerides: 161 mg/dL — ABNORMAL HIGH (ref 0.0–149.0)
VLDL: 32.2 mg/dL (ref 0.0–40.0)

## 2023-07-31 LAB — FOLATE: Folate: 15.3 ng/mL (ref >5.9)

## 2023-07-31 LAB — HEMOGLOBIN A1C: Hgb A1c MFr Bld: 6 % (ref 4.6–6.5)

## 2023-07-31 NOTE — Assessment & Plan Note (Signed)
Controlled. Following with psychiatry.  Continue Adderall 30 mg daily, Tegretol 200 mg in AM and 600 mg in PM, fluoxetine 40 mg daily, Lamictal 200 mg BID, Zyprexa 10 mg HS, Symbax 12-25 mg.

## 2023-07-31 NOTE — Progress Notes (Signed)
Subjective:    Patient ID: Katherine Carpenter, female    DOB: [birth year], 63 y.o.   MRN: [id number]  HPI  Katherine Carpenter is a very pleasant 63 y.o. female who presents today for complete physical and follow up of chronic conditions.  Immunizations: -Tetanus: Completed in 2014 -Influenza: Completed this season  -Shingles: Completed Shingrix series  Diet: Fair diet.  Exercise: No regular exercise.  Eye exam: Completes annually  Dental exam: Completes semi-annually    Pap Smear: Completed in 2023 Mammogram: Completed in June 2024  Colonoscopy: Completed in 2023, due 2033  BP Readings from Last 3 Encounters:  07/31/23 136/88  05/21/23 130/80  05/07/23 110/66         Review of Systems  Constitutional:  Negative for unexpected weight change.  HENT:  Negative for rhinorrhea.   Respiratory:  Negative for cough and shortness of breath.   Cardiovascular:  Negative for chest pain.  Gastrointestinal:  Negative for constipation and diarrhea.  Genitourinary:  Negative for difficulty urinating.  Musculoskeletal:  Positive for arthralgias. Negative for myalgias.  Skin:  Negative for rash.  Allergic/Immunologic: Negative for environmental allergies.  Neurological:  Negative for dizziness and headaches.  Psychiatric/Behavioral:  The patient is not nervous/anxious.          Past Medical History:  Diagnosis Date   Arthritis    osteo   Bipolar 1 disorder (HCC)    Depression    GERD (gastroesophageal reflux disease)    Hypertension    PONV (postoperative nausea and vomiting)    Sleep apnea    CPAP    Social History   Socioeconomic History   Marital status: Married    Spouse name: Not on file   Number of children: Not on file   Years of education: Not on file   Highest education level: Some college, no degree  Occupational History   Not on file  Tobacco Use   Smoking status: Never   Smokeless tobacco: Never  Vaping Use   Vaping status: Never Used   Substance and Sexual Activity   Alcohol use: No    Comment: Pt denies   Drug use: No    Comment: Pt denies   Sexual activity: Not on file  Other Topics Concern   Not on file  Social History Narrative   Not on file   Social Drivers of Health   Financial Resource Strain: Low Risk  (05/07/2023)   Overall Financial Resource Strain (CARDIA)    Difficulty of Paying Living Expenses: Not hard at all  Food Insecurity: No Food Insecurity (05/07/2023)   Hunger Vital Sign    Worried About Running Out of Food in the Last Year: Never true    Ran Out of Food in the Last Year: Never true  Transportation Needs: No Transportation Needs (05/07/2023)   PRAPARE - Administrator, Civil Service (Medical): No    Lack of Transportation (Non-Medical): No  Physical Activity: Inactive (05/07/2023)   Exercise Vital Sign    Days of Exercise per Week: 5 days    Minutes of Exercise per Session: 0 min  Stress: No Stress Concern Present (05/07/2023)   [employer] of Occupational Health - Occupational Stress Questionnaire    Feeling of Stress : Not at all  Social Connections: Socially Integrated (05/07/2023)   Social Connection and Isolation Panel [NHANES]    Frequency of Communication with Friends and Family: More than three times a week    Frequency of Social Gatherings  with Friends and Family: More than three times a week    Attends Religious Services: More than 4 times per year    Active Member of Clubs or Organizations: No    Attends Engineer, structural: More than 4 times per year    Marital Status: Married  Catering manager Violence: Not At Risk (01/15/2023)   Humiliation, Afraid, Rape, and Kick questionnaire    Fear of Current or Ex-Partner: No    Emotionally Abused: No    Physically Abused: No    Sexually Abused: No    Past Surgical History:  Procedure Laterality Date   CATARACT EXTRACTION W/PHACO Right 09/11/2022   Procedure: CATARACT EXTRACTION PHACO AND INTRAOCULAR  LENS PLACEMENT (IOC) RIGHT  2.97  00:21.1;  Surgeon: Nevada Crane, MD;  Location: [facility];  Service: Ophthalmology;  Laterality: Right;   CATARACT EXTRACTION W/PHACO Left 09/25/2022   Procedure: CATARACT EXTRACTION PHACO AND INTRAOCULAR LENS PLACEMENT (IOC) LEFT  5.35  00:34.4;  Surgeon: Nevada Crane, MD;  Location: [facility];  Service: Ophthalmology;  Laterality: Left;   CERVICAL BIOPSY  W/ LOOP ELECTRODE EXCISION  2016   COLONOSCOPY WITH PROPOFOL N/A 11/11/2021   Procedure: COLONOSCOPY WITH PROPOFOL;  Surgeon: Toney Reil, MD;  Location: [facility];  Service: Gastroenterology;  Laterality: N/A;   ESOPHAGOGASTRODUODENOSCOPY (EGD) WITH PROPOFOL N/A 10/27/2019   Procedure: ESOPHAGOGASTRODUODENOSCOPY (EGD) WITH PROPOFOL;  Surgeon: Pasty Spillers, MD;  Location: [facility] ENDOSCOPY;  Service: Endoscopy;  Laterality: N/A;   TUBAL LIGATION      Family History  Problem Relation Age of Onset   Asthma Mother    Depression Daughter    Arthritis Maternal Grandmother    Cancer Maternal Grandmother        lung   Cancer Paternal Grandmother    Leukemia Paternal Grandmother    Breast cancer Neg Hx     Allergies  Allergen Reactions   Codeine Other (See Comments)    Current Outpatient Medications on File Prior to Visit  Medication Sig Dispense Refill   ALPRAZolam (XANAX) 1 MG tablet Take 1 mg by mouth 5 (five) times daily. Patient may take up to 5 times a day / one in am and 2 in evening     amphetamine-dextroamphetamine (ADDERALL) 15 MG tablet Take 2 tablets by mouth daily.     buPROPion (WELLBUTRIN XL) 300 MG 24 hr tablet Take 300 mg by mouth every morning.     Calcium Carbonate-Vitamin D 500-125 MG-UNIT TABS Take by mouth in the morning and at bedtime.     carbamazepine (TEGRETOL) 200 MG tablet TAKE ONE TABLET BY MOUTH EVERY MORNING AND THREE TABLETS AT BEDTIME     cetirizine (ZYRTEC) 10 MG tablet Take 10 mg by mouth daily.     Cholecalciferol  (VITAMIN D3) 50 MCG (2000 UT) capsule Take 2,000 Units by mouth daily.     doxycycline (PERIOSTAT) 20 MG tablet Take 1 tablet (20 mg total) by mouth 2 (two) times daily with a meal. 60 tablet 3   Dupilumab (DUPIXENT) 300 MG/2ML SOAJ INJECT 1 PEN (300MG ) UNDER THE SKIN (SUBCUTANEOUS INJECTION) EVERY 14 DAYS 4 mL 5   FLUoxetine (PROZAC) 40 MG capsule Take 40 mg by mouth at bedtime.     lamoTRIgine (LAMICTAL) 200 MG tablet 2 (two) times daily.     metoprolol succinate (TOPROL-XL) 25 MG 24 hr tablet TAKE 1/2 (ONE-HALF) TABLET BY MOUTH ONCE DAILY FOR  HEARTRATE  AND  BLOOD  PRESSURE 45 tablet 0  metroNIDAZOLE (METROGEL) 0.75 % gel APPLY APPLICATION TOPICALLY TWO (2) TIMES DAILY. 45 g 6   Multiple Vitamin (MULTI-VITAMINS) TABS Take by mouth in the morning and at bedtime.     OLANZapine (ZYPREXA) 10 MG tablet Take 10 mg by mouth at bedtime.     olanzapine-FLUoxetine (SYMBYAX) 12-25 MG per capsule Take 1 capsule by mouth every evening.     omeprazole (PRILOSEC) 40 MG capsule Take 1 capsule (40 mg total) by mouth daily. For heartburn. Office visit required for further refills. 90 capsule 0   Probiotic Product (PROBIOTIC DAILY PO) Take by mouth. Takes 2 QD     simvastatin (ZOCOR) 10 MG tablet TAKE 1 TABLET BY MOUTH EVERY DAY FOR CHOLESTEROL 90 tablet 3   No current facility-administered medications on file prior to visit.    BP 136/88   Pulse (!) 105   Temp 98.7 F (37.1 C) (Temporal)   Ht 5\' 3"  (1.6 m)   Wt 224 lb (101.6 kg)   LMP 09/08/2011   SpO2 98%   BMI 39.68 kg/m  Objective:   Physical Exam HENT:     Right Ear: Tympanic membrane and ear canal normal.     Left Ear: Tympanic membrane and ear canal normal.  Eyes:     Pupils: Pupils are equal, round, and reactive to light.  Cardiovascular:     Rate and Rhythm: Normal rate and regular rhythm.  Pulmonary:     Effort: Pulmonary effort is normal.     Breath sounds: Normal breath sounds.  Abdominal:     General: Bowel sounds are normal.      Palpations: Abdomen is soft.     Tenderness: There is no abdominal tenderness.  Musculoskeletal:        General: Normal range of motion.     Cervical back: Neck supple.  Skin:    General: Skin is warm and dry.  Neurological:     Mental Status: She is alert and oriented to person, place, and time.     Cranial Nerves: No cranial nerve deficit.     Deep Tendon Reflexes:     Reflex Scores:      Patellar reflexes are 2+ on the right side and 2+ on the left side. Psychiatric:        Mood and Affect: Mood normal.           Assessment & Plan:  Preventative health care Assessment & Plan: Immunizations UTD. Pap smear UTD.  Mammogram UTD Colonoscopy UTD, due 2033  Discussed the importance of a healthy diet and regular exercise in order for weight loss, and to reduce the risk of further co-morbidity.  Exam stable. Labs pending.  Follow up in 1 year for repeat physical.    Bipolar disorder, in full remission, most recent episode mixed [facility]) Assessment & Plan: Controlled. Following with psychiatry.  Continue Adderall 30 mg daily, Tegretol 200 mg in AM and 600 mg in PM, fluoxetine 40 mg daily, Lamictal 200 mg BID, Zyprexa 10 mg HS, Symbax 12-25 mg.    Orders: -     Vitamin B12 -     Comprehensive metabolic panel -     Folate  Hyperlipidemia, unspecified hyperlipidemia type Assessment & Plan: Repeat lipid panel pending.  Continue simvastatin 10 mg daily.  Orders: -     Lipid panel -     Comprehensive metabolic panel -     Folate  Prediabetes Assessment & Plan: Repeat A1C pending.  Discussed the importance of a healthy  diet and regular exercise in order for weight loss, and to reduce the risk of further co-morbidity.   Orders: -     CBC -     Hemoglobin A1c -     Folate  Rosacea Assessment & Plan: Controlled.  Following with dermatology.  Continue Dupixent injections, 300 mg every 2 weeks and metronidazole gel 0.75% twice daily as  needed.         Doreene Nest, NP

## 2023-07-31 NOTE — Assessment & Plan Note (Signed)
Repeat A1C pending.  Discussed the importance of a healthy diet and regular exercise in order for weight loss, and to reduce the risk of further co-morbidity.  

## 2023-07-31 NOTE — Assessment & Plan Note (Signed)
Immunizations UTD. Pap smear UTD. Mammogram UTD Colonoscopy UTD, due 2033.  Discussed the importance of a healthy diet and regular exercise in order for weight loss, and to reduce the risk of further co-morbidity.  Exam stable. Labs pending.  Follow up in 1 year for repeat physical.  

## 2023-07-31 NOTE — Assessment & Plan Note (Signed)
Controlled.  Following with dermatology.  Continue Dupixent injections, 300 mg every 2 weeks and metronidazole gel 0.75% twice daily as needed.

## 2023-07-31 NOTE — Assessment & Plan Note (Signed)
Repeat lipid panel pending. Continue simvastatin 10 mg daily. 

## 2023-08-03 ENCOUNTER — Other Ambulatory Visit: Payer: Self-pay | Admitting: Primary Care

## 2023-08-03 DIAGNOSIS — E785 Hyperlipidemia, unspecified: Secondary | ICD-10-CM

## 2023-08-21 ENCOUNTER — Ambulatory Visit: Attending: Family Medicine

## 2023-08-21 DIAGNOSIS — M25561 Pain in right knee: Secondary | ICD-10-CM | POA: Diagnosis present

## 2023-08-21 DIAGNOSIS — R262 Difficulty in walking, not elsewhere classified: Secondary | ICD-10-CM | POA: Diagnosis present

## 2023-08-21 DIAGNOSIS — M25511 Pain in right shoulder: Secondary | ICD-10-CM | POA: Diagnosis present

## 2023-08-21 DIAGNOSIS — G8929 Other chronic pain: Secondary | ICD-10-CM | POA: Insufficient documentation

## 2023-08-21 DIAGNOSIS — M25562 Pain in left knee: Secondary | ICD-10-CM | POA: Insufficient documentation

## 2023-08-21 DIAGNOSIS — R2681 Unsteadiness on feet: Secondary | ICD-10-CM | POA: Insufficient documentation

## 2023-08-21 DIAGNOSIS — M6281 Muscle weakness (generalized): Secondary | ICD-10-CM | POA: Diagnosis present

## 2023-08-21 DIAGNOSIS — G4733 Obstructive sleep apnea (adult) (pediatric): Secondary | ICD-10-CM | POA: Diagnosis not present

## 2023-08-21 NOTE — Therapy (Signed)
 OUTPATIENT PHYSICAL THERAPY SHOULDER EVALUATION   Patient Name: Katherine Carpenter MRN: [id number] DOB:[birth year], 63 y.o., female Today's Date: 08/21/2023  END OF SESSION:  PT End of Session - 08/21/23 0806     Visit Number 1    Number of Visits 17    Date for PT Re-Evaluation 10/16/23    Authorization - Visit Number 1    Progress Note Due on Visit 10    PT Start Time 0801    PT Stop Time 0845    PT Time Calculation (min) 44 min    Activity Tolerance Patient tolerated treatment well    Behavior During Therapy WFL for tasks assessed/performed             Past Medical History:  Diagnosis Date   Arthritis    osteo   Bipolar 1 disorder (HCC)    Depression    GERD (gastroesophageal reflux disease)    Hypertension    PONV (postoperative nausea and vomiting)    Sleep apnea    CPAP   Past Surgical History:  Procedure Laterality Date   CATARACT EXTRACTION W/PHACO Right 09/11/2022   Procedure: CATARACT EXTRACTION PHACO AND INTRAOCULAR LENS PLACEMENT (IOC) RIGHT  2.97  00:21.1;  Surgeon: Nevada Crane, MD;  Location: [facility];  Service: Ophthalmology;  Laterality: Right;   CATARACT EXTRACTION W/PHACO Left 09/25/2022   Procedure: CATARACT EXTRACTION PHACO AND INTRAOCULAR LENS PLACEMENT (IOC) LEFT  5.35  00:34.4;  Surgeon: Nevada Crane, MD;  Location: [facility];  Service: Ophthalmology;  Laterality: Left;   CERVICAL BIOPSY  W/ LOOP ELECTRODE EXCISION  2016   COLONOSCOPY WITH PROPOFOL N/A 11/11/2021   Procedure: COLONOSCOPY WITH PROPOFOL;  Surgeon: Toney Reil, MD;  Location: [facility] ENDOSCOPY;  Service: Gastroenterology;  Laterality: N/A;   ESOPHAGOGASTRODUODENOSCOPY (EGD) WITH PROPOFOL N/A 10/27/2019   Procedure: ESOPHAGOGASTRODUODENOSCOPY (EGD) WITH PROPOFOL;  Surgeon: Pasty Spillers, MD;  Location: [facility] ENDOSCOPY;  Service: Endoscopy;  Laterality: N/A;   TUBAL LIGATION     Patient Active Problem List   Diagnosis Date Noted   Pain and  swelling of eyelid of right eye 04/10/2023   Acute foot pain, right 03/21/2023   Fall 03/21/2023   Preventative health care 07/25/2022   History of colonic polyps    Cecal polyp    Prediabetes 07/22/2021   Other fatigue 06/02/2021   Eczema 07/09/2020   Balance problem 10/15/2019   Nausea 05/27/2019   GERD (gastroesophageal reflux disease) 10/09/2018   Screening for cervical cancer 08/01/2018   Medicare annual wellness visit, subsequent 05/13/2018   Hyperlipidemia 05/08/2017   Essential hypertension 05/08/2017   Bipolar disorder (HCC) 05/08/2017   Rosacea 05/08/2017    PCP: Doreene Nest, NP  REFERRING PROVIDER: Hannah Beat, MD  REFERRING DIAG: M25.511 (ICD-10-CM) - Acute pain of right shoulder   THERAPY DIAG:  Acute pain of right shoulder  Rationale for Evaluation and Treatment: Rehabilitation  ONSET DATE: 02/11/2024  SUBJECTIVE:  SUBJECTIVE STATEMENT:  Pt reports that she had a fall back in August/September and that was the first of 3 falls that occurred close together.  Pt notes she fell off the deck on the first time and landed on the R shoulder and R hip.  Pt has noted having increased pain and discomfort since that time.  Pt reports she has had to changed her sleeping habits, because she cannot sleep on her back due to the pain it causes in her R shoulder.    Hand dominance: Right  PERTINENT HISTORY: Pt with prior history of arthritis and bursitis in the R hip which was treated effectively with PT.  Pt had shoulder pain that was manageable from the fall that hurt her hip, however has progressed over the past 2-3 weeks.  Pt notes that pain to be less tolerable and the tylenol and arthritis cream to not being as effective as it was prior.  Pt's MD noted it may be a RTC tear or some  involvement.  PAIN:  Are you having pain? Yes: NPRS scale: 5-6/10 Pain location: R shoulder, anterior and down into the biceps region Pain description: Sharp shooting pain Aggravating factors: lifting arm, using hands (feels as though she can't use hand or has no control of her hands) Relieving factors: Pt reports the pain to always be present, but does use tylenol and arthritis cream will dull the pain.  PRECAUTIONS: Fall  RED FLAGS: None   WEIGHT BEARING RESTRICTIONS: No  FALLS:  Has patient fallen in last 6 months? Yes. Number of falls 3  LIVING ENVIRONMENT: Lives with: lives with their spouse Lives in: House/apartment Stairs: Yes: External: 3 steps; on right going up and on left going up Has following equipment at home: Single point cane  OCCUPATION: Not currently working  PLOF: Independent  PATIENT GOALS:  Pt wants to be able to use the shoulder more and reduce the pain she is experiencing.  NEXT MD VISIT:   OBJECTIVE:  Note: Objective measures were completed at Evaluation unless otherwise noted.  DIAGNOSTIC FINDINGS:  No imaging of the shoulder.  PATIENT SURVEYS:  Quick Dash 43.2%  COGNITION: Overall cognitive status: Within functional limits for tasks assessed     SENSATION: Pt with reported numbness/tingling in the R hand  POSTURE: Pt with typical forward head and rounded shoulders in sitting.    UPPER EXTREMITY ROM:   Active ROM Right eval Left eval  Shoulder flexion 130* 145  Shoulder extension    Shoulder abduction 96 156  Shoulder adduction    Shoulder internal rotation    Shoulder external rotation    Elbow flexion WNL WNL  Elbow extension WNL WNL  Wrist flexion    Wrist extension    Wrist ulnar deviation    Wrist radial deviation    Wrist pronation    Wrist supination    (Blank rows = not tested)  UPPER EXTREMITY MMT:  MMT Right eval Left eval  Shoulder flexion 4* 4+  Shoulder extension    Shoulder abduction 3+* 4-*   Shoulder adduction    Shoulder internal rotation    Shoulder external rotation    Middle trapezius    Lower trapezius    Elbow flexion 4* 4+  Elbow extension 4* 4+  Wrist flexion    Wrist extension    Wrist ulnar deviation    Wrist radial deviation    Wrist pronation    Wrist supination    Grip strength (lbs)    (Blank  rows = not tested)  SHOULDER SPECIAL TESTS: Impingement tests: Neer impingement test: negative, Hawkins/Kennedy impingement test: positive , and Painful arc test: negative SLAP lesions:  Not performed Rotator cuff assessment: Empty can test: positive , Full can test: positive , External rotation lag sign: positive , and Belly press test: positive  Biceps assessment: Yergason's test: negative and Speed's test: positive   JOINT MOBILITY TESTING:  Pt with good joint mobility, however notes increased pain with AP mobilization of the shoulder, specifically  PALPATION:  Pt with no palpable TP's or noticeable pain when palpating the RTC musculature.                                                                                                                               TREATMENT DATE: 08/21/23  Self-Care Home Management:  Pt educated on rehab potential, along with prognosis given the results of the special tests and evaluation.  Pt verbalized understanding, and had several questions throughout, which were addressed by therapist as evaluation.  Pt given prognosis and plan for recovery and verbalized understanding as well. Pt also given isometric exercises as part of HEP, was given visual and verbal cues on how to perform along with written copy.  Pt educated on the rationale behind performing isometric exercises.   PATIENT EDUCATION: Education details: Pt educated throughout session about proper posture and technique with exercises. Improved exercise technique, movement at target joints, use of target muscles after min to mod verbal, visual, tactile cues  Person  educated: Patient Education method: Explanation and Demonstration Education comprehension: verbalized understanding  HOME EXERCISE PROGRAM:  Access Code: [email or web address] URL: [email or web address] Date: 08/21/2023 Prepared by: Tomasa Hose  Exercises - Isometric Shoulder Flexion at Wall  - 1 x daily - 7 x weekly - 3 sets - 10 reps - 3 hold - Isometric Shoulder Abduction at Wall  - 1 x daily - 7 x weekly - 3 sets - 10 reps - 3 hold - Isometric Shoulder Adduction  - 1 x daily - 7 x weekly - 3 sets - 10 reps - 3 hold - Isometric Shoulder Extension at Wall  - 1 x daily - 7 x weekly - 3 sets - 10 reps - 3 hold  ASSESSMENT:  CLINICAL IMPRESSION: Patient is a 63 y.o. female who was seen today for physical therapy evaluation and treatment for R shoulder pain.  Pt presents with physical impairments of decreased activity tolerance, decreased ROM of R shoulder, increased pain in R shoulder, and decreased strength in B UE's, with the R being worse than the L as noted.  Pt demonstrates signs and symptoms consistent with RTC involvement, specifically with referral pain that is consistent with either subscapularis or infraspinatus of the R shoulder.  Pt will benefit from skilled therapy to address tolerance, ROM, pain, and strength impairments necessary for improvement in quality of life.  Pt. demonstrates understanding of this plan of care and agrees with this plan.  OBJECTIVE IMPAIRMENTS: decreased activity tolerance, decreased ROM, decreased strength, hypomobility, impaired UE functional use, obesity, and pain.   ACTIVITY LIMITATIONS: carrying, lifting, and reach over head  PARTICIPATION LIMITATIONS: meal prep, cleaning, laundry, driving, shopping, community activity, and yard work  PERSONAL FACTORS: Age, Fitness, Past/current experiences, Time since onset of injury/illness/exacerbation, and 3+ comorbidities: HTN, bipolar disorder, falls, prediabetic  are also affecting patient's functional  outcome.   REHAB POTENTIAL: Good  CLINICAL DECISION MAKING: Evolving/moderate complexity  EVALUATION COMPLEXITY: Moderate   GOALS: Goals reviewed with patient? Yes  SHORT TERM GOALS: Target date: 09/18/2023  Pt will be independent with HEP in order to demonstrate increased ability to perform tasks related to occupation/hobbies. Baseline:  Pt given isometric exercises as listed above Goal status: INITIAL   LONG TERM GOALS: Target date: 11/13/2023  Pt will decrease quick DASH score by at least 8% in order to demonstrate clinically significant reduction in disability.  Baseline: 43.2% Goal status: INITIAL  2.  Pt will reduce overall pain level to 0/10 by utilizing a combination of stretching, strengthening exercises, and pain-reducing modalities in order to improve overall QoL. Baseline: 5-6/10 at rest Goal status: INITIAL  3.  Pt will improve R shoulder flexion AROM to be ~to L shoulder AROM (140 deg) in order to be able to reach into upper cabinets within kitchen. Baseline: 130 deg with pain Goal status: INITIAL  4.  Pt will increase strength by at least 1/2 MMT grade in order to demonstrate improvement in strength and function       Baseline: Globally 4-/5 on the R shoulder with significant pain in all planes of mobility Goal status: INITIAL   PLAN:  PT FREQUENCY: 2x/week  PT DURATION: 8 weeks  PLANNED INTERVENTIONS: 97110-Therapeutic exercises, 97530- Therapeutic activity, 97112- Neuromuscular re-education, 97535- Self Care, 29562- Manual therapy, Dry Needling, and Joint mobilization  PLAN FOR NEXT SESSION: manual therapy to improve ROM within tolerable ROM, discuss dry needling for painful regions, continue assessment of potential shoulder pathologies.     Nolon Bussing, PT, DPT Physical Therapist - [facility]  08/21/23, 11:08 AM

## 2023-08-23 ENCOUNTER — Ambulatory Visit: Admitting: Physical Therapy

## 2023-08-23 DIAGNOSIS — M25511 Pain in right shoulder: Secondary | ICD-10-CM

## 2023-08-23 DIAGNOSIS — M6281 Muscle weakness (generalized): Secondary | ICD-10-CM

## 2023-08-23 NOTE — Therapy (Addendum)
 OUTPATIENT PHYSICAL THERAPY SHOULDER treatment   Patient Name: Katherine Carpenter MRN: [id number] DOB:1961-03-08, 63 y.o., female Today's Date: 08/23/2023  END OF SESSION:  PT End of Session - 08/23/23 0804     Visit Number 2    Number of Visits 17    Date for PT Re-Evaluation 10/16/23    Progress Note Due on Visit 10    PT Start Time 0803    PT Stop Time 0843    PT Time Calculation (min) 40 min    Activity Tolerance Patient tolerated treatment well    Behavior During Therapy [facility] for tasks assessed/performed             Past Medical History:  Diagnosis Date   Arthritis    osteo   Bipolar 1 disorder (HCC)    Depression    GERD (gastroesophageal reflux disease)    Hypertension    PONV (postoperative nausea and vomiting)    Sleep apnea    CPAP   Past Surgical History:  Procedure Laterality Date   CATARACT EXTRACTION W/PHACO Right 09/11/2022   Procedure: CATARACT EXTRACTION PHACO AND INTRAOCULAR LENS PLACEMENT (IOC) RIGHT  2.97  00:21.1;  Surgeon: Nevada Crane, MD;  Location: [facility];  Service: Ophthalmology;  Laterality: Right;   CATARACT EXTRACTION W/PHACO Left 09/25/2022   Procedure: CATARACT EXTRACTION PHACO AND INTRAOCULAR LENS PLACEMENT (IOC) LEFT  5.35  00:34.4;  Surgeon: Nevada Crane, MD;  Location: [facility];  Service: Ophthalmology;  Laterality: Left;   CERVICAL BIOPSY  W/ LOOP ELECTRODE EXCISION  2016   COLONOSCOPY WITH PROPOFOL N/A 11/11/2021   Procedure: COLONOSCOPY WITH PROPOFOL;  Surgeon: Toney Reil, MD;  Location: [facility];  Service: Gastroenterology;  Laterality: N/A;   ESOPHAGOGASTRODUODENOSCOPY (EGD) WITH PROPOFOL N/A 10/27/2019   Procedure: ESOPHAGOGASTRODUODENOSCOPY (EGD) WITH PROPOFOL;  Surgeon: Pasty Spillers, MD;  Location: [facility] ENDOSCOPY;  Service: Endoscopy;  Laterality: N/A;   TUBAL LIGATION     Patient Active Problem List   Diagnosis Date Noted   Pain and swelling of eyelid of right eye  04/10/2023   Acute foot pain, right 03/21/2023   Fall 03/21/2023   Preventative health care 07/25/2022   History of colonic polyps    Cecal polyp    Prediabetes 07/22/2021   Other fatigue 06/02/2021   Eczema 07/09/2020   Balance problem 10/15/2019   Nausea 05/27/2019   GERD (gastroesophageal reflux disease) 10/09/2018   Screening for cervical cancer 08/01/2018   Medicare annual wellness visit, subsequent 05/13/2018   Hyperlipidemia 05/08/2017   Essential hypertension 05/08/2017   Bipolar disorder (HCC) 05/08/2017   Rosacea 05/08/2017    PCP: Doreene Nest, NP  REFERRING PROVIDER: Hannah Beat, MD  REFERRING DIAG: M25.511 (ICD-10-CM) - Acute pain of right shoulder   THERAPY DIAG:  Acute pain of right shoulder  Muscle weakness (generalized)  Rationale for Evaluation and Treatment: Rehabilitation  ONSET DATE: 02/11/2024  SUBJECTIVE:  SUBJECTIVE STATEMENT:  08/23/2023  Pt reports that pain is about 4/10 on this day. States that mornings are generally better, and the pain gets worse through the day.  Reports that she was able to complete some of the HEP yesterday with a little soreness following    Pt reports that she had a fall back in August/September and that was the first of 3 falls that occurred close together.  Pt notes she fell off the deck on the first time and landed on the R shoulder and R hip.  Pt has noted having increased pain and discomfort since that time.  Pt reports she has had to changed her sleeping habits, because she cannot sleep on her back due to the pain it causes in her R shoulder.    Hand dominance: Right  PERTINENT HISTORY: Pt with prior history of arthritis and bursitis in the R hip which was treated effectively with PT.  Pt had shoulder pain that was  manageable from the fall that hurt her hip, however has progressed over the past 2-3 weeks.  Pt notes that pain to be less tolerable and the tylenol and arthritis cream to not being as effective as it was prior.  Pt's MD noted it may be a RTC tear or some involvement.  PAIN:  Are you having pain? Yes: NPRS scale: 5-6/10 Pain location: R shoulder, anterior and down into the biceps region Pain description: Sharp shooting pain Aggravating factors: lifting arm, using hands (feels as though she can't use hand or has no control of her hands) Relieving factors: Pt reports the pain to always be present, but does use tylenol and arthritis cream will dull the pain.  PRECAUTIONS: Fall  RED FLAGS: None   WEIGHT BEARING RESTRICTIONS: No  FALLS:  Has patient fallen in last 6 months? Yes. Number of falls 3  LIVING ENVIRONMENT: Lives with: lives with their spouse Lives in: House/apartment Stairs: Yes: External: 3 steps; on right going up and on left going up Has following equipment at home: Single point cane  OCCUPATION: Not currently working  PLOF: Independent  PATIENT GOALS:  Pt wants to be able to use the shoulder more and reduce the pain she is experiencing.  NEXT MD VISIT:   OBJECTIVE:  Note: Objective measures were completed at Evaluation unless otherwise noted.  DIAGNOSTIC FINDINGS:  No imaging of the shoulder.  PATIENT SURVEYS:  Quick Dash 43.2%  COGNITION: Overall cognitive status: Within functional limits for tasks assessed     SENSATION: Pt with reported numbness/tingling in the R hand  POSTURE: Pt with typical forward head and rounded shoulders in sitting.    UPPER EXTREMITY ROM:   Active ROM Right eval Left eval  Shoulder flexion 130* 145  Shoulder extension    Shoulder abduction 96 156  Shoulder adduction    Shoulder internal rotation    Shoulder external rotation    Elbow flexion WNL WNL  Elbow extension WNL WNL  Wrist flexion    Wrist extension     Wrist ulnar deviation    Wrist radial deviation    Wrist pronation    Wrist supination    (Blank rows = not tested)  UPPER EXTREMITY MMT:  MMT Right eval Left eval  Shoulder flexion 4* 4+  Shoulder extension    Shoulder abduction 3+* 4-*  Shoulder adduction    Shoulder internal rotation    Shoulder external rotation    Middle trapezius    Lower trapezius    Elbow flexion  4* 4+  Elbow extension 4* 4+  Wrist flexion    Wrist extension    Wrist ulnar deviation    Wrist radial deviation    Wrist pronation    Wrist supination    Grip strength (lbs)    (Blank rows = not tested)  SHOULDER SPECIAL TESTS: Impingement tests: Neer impingement test: negative, Hawkins/Kennedy impingement test: positive , and Painful arc test: negative SLAP lesions:  Not performed Rotator cuff assessment: Empty can test: positive , Full can test: positive , External rotation lag sign: positive , and Belly press test: positive  Biceps assessment: Yergason's test: negative and Speed's test: positive   JOINT MOBILITY TESTING:  Pt with good joint mobility, however notes increased pain with AP mobilization of the shoulder, specifically  PALPATION:  Pt with no palpable TP's or noticeable pain when palpating the RTC musculature.                                                                                                                               TREATMENT DATE: 08/23/23   Manual therapy to address pain in the R shoulder.  Grade 2 AP through varied abduction  Grade 2 inferior from 0 to ~ 80 deg abduction  PROM in to flexion/scaption x 2 min pain free range STM to deltoid medial board of scapul, tere major, and infraspinatus x 10 min with TP release.   Supine isometric shoulder extension  x 12  Supine isometric shoulder adduction x 12  Sitting scapular retraction x 10  Sitting shoulder roll with hold down and back x 3 sec x 6  Standing isometric shoulder abdduction x 10 3 se chold  Standing  isometric  shoulder flexion x 10  Sitting shoulder ER in pain free range. X 12    PATIENT EDUCATION: Education details: Pt educated throughout session about proper posture and technique with exercises. Improved exercise technique, movement at target joints, use of target muscles after min to mod verbal, visual, tactile cues  Person educated: Patient Education method: Explanation and Demonstration Education comprehension: verbalized understanding  HOME EXERCISE PROGRAM:  Access Code: [email or web address] URL: [email or web address] Date: 08/23/2023 Prepared by: Grier Rocher  Exercises - Isometric Shoulder Flexion at Wall  - 1 x daily - 7 x weekly - 3 sets - 10 reps - 3 hold - Isometric Shoulder Abduction at Wall  - 1 x daily - 7 x weekly - 3 sets - 10 reps - 3 hold - Isometric Shoulder Adduction  - 1 x daily - 7 x weekly - 3 sets - 10 reps - 3 hold - Isometric Shoulder Extension at Wall  - 1 x daily - 7 x weekly - 3 sets - 10 reps - 3 hold - Seated Shoulder Rolls  - 1 x daily - 7 x weekly - 3 sets - 10 reps - Seated Scapular Retraction  - 1 x daily - 7 x weekly - 3 sets - 10 reps -  3 hold - Seated Single Arm Shoulder External Rotation  - 1 x daily - 7 x weekly - 3 sets - 10 reps  ASSESSMENT:  CLINICAL IMPRESSION: Patient is a 63 y.o. female who was seen today for physical therapy  treatment for R shoulder pain. PT treatment focused pain management with STM and grade 2 mobs to the R shoulder. Educated for importance of shoulder rhythm to reduce impingement of RTC on the R shoulder as well as pain free range for HEP.  Pt. demonstrates understanding of this plan of care and agrees with this plan; will continue to benefit from skilled PT to address pain and strength deficits. .    OBJECTIVE IMPAIRMENTS: decreased activity tolerance, decreased ROM, decreased strength, hypomobility, impaired UE functional use, obesity, and pain.   ACTIVITY LIMITATIONS: carrying, lifting, and reach over  head  PARTICIPATION LIMITATIONS: meal prep, cleaning, laundry, driving, shopping, community activity, and yard work  PERSONAL FACTORS: Age, Fitness, Past/current experiences, Time since onset of injury/illness/exacerbation, and 3+ comorbidities: HTN, bipolar disorder, falls, prediabetic  are also affecting patient's functional outcome.   REHAB POTENTIAL: Good  CLINICAL DECISION MAKING: Evolving/moderate complexity  EVALUATION COMPLEXITY: Moderate   GOALS: Goals reviewed with patient? Yes  SHORT TERM GOALS: Target date: 09/18/2023  Pt will be independent with HEP in order to demonstrate increased ability to perform tasks related to occupation/hobbies. Baseline:  Pt given isometric exercises as listed above Goal status: INITIAL   LONG TERM GOALS: Target date: 11/13/2023  Pt will decrease quick DASH score by at least 8% in order to demonstrate clinically significant reduction in disability.  Baseline: 43.2% Goal status: INITIAL  2.  Pt will reduce overall pain level to 0/10 by utilizing a combination of stretching, strengthening exercises, and pain-reducing modalities in order to improve overall QoL. Baseline: 5-6/10 at rest Goal status: INITIAL  3.  Pt will improve R shoulder flexion AROM to be ~to L shoulder AROM (140 deg) in order to be able to reach into upper cabinets within kitchen. Baseline: 130 deg with pain Goal status: INITIAL  4.  Pt will increase strength by at least 1/2 MMT grade in order to demonstrate improvement in strength and function       Baseline: Globally 4-/5 on the R shoulder with significant pain in all planes of mobility Goal status: INITIAL   PLAN:  PT FREQUENCY: 2x/week  PT DURATION: 8 weeks  PLANNED INTERVENTIONS: 97110-Therapeutic exercises, 97530- Therapeutic activity, 97112- Neuromuscular re-education, 97535- Self Care, 16109- Manual therapy, Dry Needling, and Joint mobilization  PLAN FOR NEXT SESSION:   manual therapy to improve ROM within  tolerable ROM, discuss dry needling for painful regions, continue assessment of potential shoulder pathologies.     Grier Rocher PT, DPT  Physical Therapist - [place]  [facility]  10:36 AM 08/23/23

## 2023-08-27 ENCOUNTER — Ambulatory Visit: Admitting: Physical Therapy

## 2023-08-27 ENCOUNTER — Encounter: Payer: Self-pay | Admitting: Physical Therapy

## 2023-08-27 DIAGNOSIS — M25511 Pain in right shoulder: Secondary | ICD-10-CM

## 2023-08-27 DIAGNOSIS — M6281 Muscle weakness (generalized): Secondary | ICD-10-CM

## 2023-08-27 NOTE — Therapy (Signed)
 OUTPATIENT PHYSICAL THERAPY SHOULDER treatment   Patient Name: Katherine Carpenter MRN: [id number] DOB:May 07, 1961, 63 y.o., female Today's Date: 08/27/2023  END OF SESSION:  PT End of Session - 08/27/23 0759     Visit Number 3    Number of Visits 17    Date for PT Re-Evaluation 10/16/23    Progress Note Due on Visit 10    PT Start Time 0804    PT Stop Time 0845    PT Time Calculation (min) 41 min    Activity Tolerance Patient tolerated treatment well    Behavior During Therapy [facility] for tasks assessed/performed             Past Medical History:  Diagnosis Date   Arthritis    osteo   Bipolar 1 disorder (HCC)    Depression    GERD (gastroesophageal reflux disease)    Hypertension    PONV (postoperative nausea and vomiting)    Sleep apnea    CPAP   Past Surgical History:  Procedure Laterality Date   CATARACT EXTRACTION W/PHACO Right 09/11/2022   Procedure: CATARACT EXTRACTION PHACO AND INTRAOCULAR LENS PLACEMENT (IOC) RIGHT  2.97  00:21.1;  Surgeon: Nevada Crane, MD;  Location: [facility];  Service: Ophthalmology;  Laterality: Right;   CATARACT EXTRACTION W/PHACO Left 09/25/2022   Procedure: CATARACT EXTRACTION PHACO AND INTRAOCULAR LENS PLACEMENT (IOC) LEFT  5.35  00:34.4;  Surgeon: Nevada Crane, MD;  Location: [facility];  Service: Ophthalmology;  Laterality: Left;   CERVICAL BIOPSY  W/ LOOP ELECTRODE EXCISION  2016   COLONOSCOPY WITH PROPOFOL N/A 11/11/2021   Procedure: COLONOSCOPY WITH PROPOFOL;  Surgeon: Toney Reil, MD;  Location: [facility];  Service: Gastroenterology;  Laterality: N/A;   ESOPHAGOGASTRODUODENOSCOPY (EGD) WITH PROPOFOL N/A 10/27/2019   Procedure: ESOPHAGOGASTRODUODENOSCOPY (EGD) WITH PROPOFOL;  Surgeon: Pasty Spillers, MD;  Location: [facility] ENDOSCOPY;  Service: Endoscopy;  Laterality: N/A;   TUBAL LIGATION     Patient Active Problem List   Diagnosis Date Noted   Pain and swelling of eyelid of right eye  04/10/2023   Acute foot pain, right 03/21/2023   Fall 03/21/2023   Preventative health care 07/25/2022   History of colonic polyps    Cecal polyp    Prediabetes 07/22/2021   Other fatigue 06/02/2021   Eczema 07/09/2020   Balance problem 10/15/2019   Nausea 05/27/2019   GERD (gastroesophageal reflux disease) 10/09/2018   Screening for cervical cancer 08/01/2018   Medicare annual wellness visit, subsequent 05/13/2018   Hyperlipidemia 05/08/2017   Essential hypertension 05/08/2017   Bipolar disorder (HCC) 05/08/2017   Rosacea 05/08/2017    PCP: Doreene Nest, NP  REFERRING PROVIDER: Hannah Beat, MD  REFERRING DIAG: M25.511 (ICD-10-CM) - Acute pain of right shoulder   THERAPY DIAG:  Acute pain of right shoulder  Muscle weakness (generalized)  Rationale for Evaluation and Treatment: Rehabilitation  ONSET DATE: 02/11/2024  SUBJECTIVE:  SUBJECTIVE STATEMENT:  08/27/2023  Pt reports that pain is about 1/10 at start of PT session. Pt reports that she was able to move some stuff around the house with minimal pain.     Pt reports that she had a fall back in August/September and that was the first of 3 falls that occurred close together.  Pt notes she fell off the deck on the first time and landed on the R shoulder and R hip.  Pt has noted having increased pain and discomfort since that time.  Pt reports she has had to changed her sleeping habits, because she cannot sleep on her back due to the pain it causes in her R shoulder.    Hand dominance: Right  PERTINENT HISTORY: Pt with prior history of arthritis and bursitis in the R hip which was treated effectively with PT.  Pt had shoulder pain that was manageable from the fall that hurt her hip, however has progressed over the past 2-3 weeks.  Pt  notes that pain to be less tolerable and the tylenol and arthritis cream to not being as effective as it was prior.  Pt's MD noted it may be a RTC tear or some involvement.  PAIN:  Are you having pain? Yes: NPRS scale: 5-6/10 Pain location: R shoulder, anterior and down into the biceps region Pain description: Sharp shooting pain Aggravating factors: lifting arm, using hands (feels as though she can't use hand or has no control of her hands) Relieving factors: Pt reports the pain to always be present, but does use tylenol and arthritis cream will dull the pain.  PRECAUTIONS: Fall  RED FLAGS: None   WEIGHT BEARING RESTRICTIONS: No  FALLS:  Has patient fallen in last 6 months? Yes. Number of falls 3  LIVING ENVIRONMENT: Lives with: lives with their spouse Lives in: House/apartment Stairs: Yes: External: 3 steps; on right going up and on left going up Has following equipment at home: Single point cane  OCCUPATION: Not currently working  PLOF: Independent  PATIENT GOALS:  Pt wants to be able to use the shoulder more and reduce the pain she is experiencing.  NEXT MD VISIT:   OBJECTIVE:  Note: Objective measures were completed at Evaluation unless otherwise noted.  DIAGNOSTIC FINDINGS:  No imaging of the shoulder.  PATIENT SURVEYS:  Quick Dash 43.2%  COGNITION: Overall cognitive status: Within functional limits for tasks assessed     SENSATION: Pt with reported numbness/tingling in the R hand  POSTURE: Pt with typical forward head and rounded shoulders in sitting.    UPPER EXTREMITY ROM:   Active ROM Right eval Left eval  Shoulder flexion 130* 145  Shoulder extension    Shoulder abduction 96 156  Shoulder adduction    Shoulder internal rotation    Shoulder external rotation    Elbow flexion WNL WNL  Elbow extension WNL WNL  Wrist flexion    Wrist extension    Wrist ulnar deviation    Wrist radial deviation    Wrist pronation    Wrist supination     (Blank rows = not tested)  UPPER EXTREMITY MMT:  MMT Right eval Left eval  Shoulder flexion 4* 4+  Shoulder extension    Shoulder abduction 3+* 4-*  Shoulder adduction    Shoulder internal rotation    Shoulder external rotation    Middle trapezius    Lower trapezius    Elbow flexion 4* 4+  Elbow extension 4* 4+  Wrist flexion  Wrist extension    Wrist ulnar deviation    Wrist radial deviation    Wrist pronation    Wrist supination    Grip strength (lbs)    (Blank rows = not tested)  SHOULDER SPECIAL TESTS: Impingement tests: Neer impingement test: negative, Hawkins/Kennedy impingement test: positive , and Painful arc test: negative SLAP lesions:  Not performed Rotator cuff assessment: Empty can test: positive , Full can test: positive , External rotation lag sign: positive , and Belly press test: positive  Biceps assessment: Yergason's test: negative and Speed's test: positive   JOINT MOBILITY TESTING:  Pt with good joint mobility, however notes increased pain with AP mobilization of the shoulder, specifically  PALPATION:  Pt with no palpable TP's or noticeable pain when palpating the RTC musculature.                                                                                                                               TREATMENT DATE: 08/27/23  TE.  Seated:  UT stretch 2 x 20 sec bil  Chin tuck, 3 sec hold, x 10  Levator stretch 2 x 20 sec bil  Reverse Shoulder rolls with hold down and back for 2 sec x 10  Shoulder ER with scapula retraction x 10 with 3 sec hold  Shoulder extension with half boster longitudinal behind spine x 10  Low row YTB x 12  UE slide across table into flexion x 12 no pain reported  UE slide across table into abduction x 10 with 2 sec hold  Wall finger ladder into flexion x 2 and abduction x 2   Manual therapy:  STM to deltoid, RTC insertion, teres major, subscapularis, and UT for TP release and cross frinctional massage for  improved ROM and reduced pain.   Pt reports no pain upon completion of manual therapy.    PATIENT EDUCATION: Education details: Pt educated throughout session about proper posture and technique with exercises. Improved exercise technique, movement at target joints, use of target muscles after min to mod verbal, visual, tactile cues  Person educated: Patient Education method: Explanation and Demonstration Education comprehension: verbalized understanding  HOME EXERCISE PROGRAM:  Access Code: [email or web address] URL: [email or web address] Date: 08/23/2023 Prepared by: Grier Rocher  Exercises - Isometric Shoulder Flexion at Wall  - 1 x daily - 7 x weekly - 3 sets - 10 reps - 3 hold - Isometric Shoulder Abduction at Wall  - 1 x daily - 7 x weekly - 3 sets - 10 reps - 3 hold - Isometric Shoulder Adduction  - 1 x daily - 7 x weekly - 3 sets - 10 reps - 3 hold - Isometric Shoulder Extension at Wall  - 1 x daily - 7 x weekly - 3 sets - 10 reps - 3 hold - Seated Shoulder Rolls  - 1 x daily - 7 x weekly - 3 sets - 10 reps - Seated Scapular Retraction  - 1  x daily - 7 x weekly - 3 sets - 10 reps - 3 hold - Seated Single Arm Shoulder External Rotation  - 1 x daily - 7 x weekly - 3 sets - 10 reps  ASSESSMENT:  CLINICAL IMPRESSION: Patient is a 63 y.o. female who was seen today for physical therapy treatment for R shoulder pain. PT treatment focused on improved parascapular activation and reduced UT compensation in all direction. STM for pain management, tolerated very well and reports no pain upon completion. Pt will continue to benefit from skilled PT to address pain and strength deficits. .    OBJECTIVE IMPAIRMENTS: decreased activity tolerance, decreased ROM, decreased strength, hypomobility, impaired UE functional use, obesity, and pain.   ACTIVITY LIMITATIONS: carrying, lifting, and reach over head  PARTICIPATION LIMITATIONS: meal prep, cleaning, laundry, driving, shopping,  community activity, and yard work  PERSONAL FACTORS: Age, Fitness, Past/current experiences, Time since onset of injury/illness/exacerbation, and 3+ comorbidities: HTN, bipolar disorder, falls, prediabetic  are also affecting patient's functional outcome.   REHAB POTENTIAL: Good  CLINICAL DECISION MAKING: Evolving/moderate complexity  EVALUATION COMPLEXITY: Moderate   GOALS: Goals reviewed with patient? Yes  SHORT TERM GOALS: Target date: 09/18/2023  Pt will be independent with HEP in order to demonstrate increased ability to perform tasks related to occupation/hobbies. Baseline:  Pt given isometric exercises as listed above Goal status: INITIAL   LONG TERM GOALS: Target date: 11/13/2023  Pt will decrease quick DASH score by at least 8% in order to demonstrate clinically significant reduction in disability.  Baseline: 43.2% Goal status: INITIAL  2.  Pt will reduce overall pain level to 0/10 by utilizing a combination of stretching, strengthening exercises, and pain-reducing modalities in order to improve overall QoL. Baseline: 5-6/10 at rest Goal status: INITIAL  3.  Pt will improve R shoulder flexion AROM to be ~to L shoulder AROM (140 deg) in order to be able to reach into upper cabinets within kitchen. Baseline: 130 deg with pain Goal status: INITIAL  4.  Pt will increase strength by at least 1/2 MMT grade in order to demonstrate improvement in strength and function       Baseline: Globally 4-/5 on the R shoulder with significant pain in all planes of mobility Goal status: INITIAL   PLAN:  PT FREQUENCY: 2x/week  PT DURATION: 8 weeks  PLANNED INTERVENTIONS: 97110-Therapeutic exercises, 97530- Therapeutic activity, 97112- Neuromuscular re-education, 97535- Self Care, 08657- Manual therapy, Dry Needling, and Joint mobilization  PLAN FOR NEXT SESSION:   manual therapy to improve ROM within tolerable ROM,  Therex for ROM and parascapular strengthening.  discuss dry  needling for painful regions, continue assessment of potential shoulder pathologies.     Grier Rocher PT, DPT  Physical Therapist - [facility] [place]  8:07 AM 08/27/23

## 2023-08-29 ENCOUNTER — Encounter: Admitting: Physical Therapy

## 2023-08-31 ENCOUNTER — Ambulatory Visit: Admitting: Physical Therapy

## 2023-08-31 DIAGNOSIS — H524 Presbyopia: Secondary | ICD-10-CM | POA: Diagnosis not present

## 2023-08-31 DIAGNOSIS — H52223 Regular astigmatism, bilateral: Secondary | ICD-10-CM | POA: Diagnosis not present

## 2023-09-03 ENCOUNTER — Ambulatory Visit: Admitting: Physical Therapy

## 2023-09-03 DIAGNOSIS — M6281 Muscle weakness (generalized): Secondary | ICD-10-CM

## 2023-09-03 DIAGNOSIS — M25511 Pain in right shoulder: Secondary | ICD-10-CM | POA: Diagnosis not present

## 2023-09-03 NOTE — Therapy (Signed)
 OUTPATIENT PHYSICAL THERAPY SHOULDER treatment   Patient Name: Katherine Carpenter MRN: [id number] DOB:[birth year], 63 y.o., female Today's Date: 09/03/2023  END OF SESSION:  PT End of Session - 09/03/23 0755     Visit Number 4    Number of Visits 17    Date for PT Re-Evaluation 10/16/23    Progress Note Due on Visit 10    PT Start Time 0802    PT Stop Time 0842    PT Time Calculation (min) 40 min    Activity Tolerance Patient tolerated treatment well    Behavior During Therapy [facility] for tasks assessed/performed             Past Medical History:  Diagnosis Date   Arthritis    osteo   Bipolar 1 disorder (HCC)    Depression    GERD (gastroesophageal reflux disease)    Hypertension    PONV (postoperative nausea and vomiting)    Sleep apnea    CPAP   Past Surgical History:  Procedure Laterality Date   CATARACT EXTRACTION W/PHACO Right 09/11/2022   Procedure: CATARACT EXTRACTION PHACO AND INTRAOCULAR LENS PLACEMENT (IOC) RIGHT  2.97  00:21.1;  Surgeon: Nevada Crane, MD;  Location: [facility];  Service: Ophthalmology;  Laterality: Right;   CATARACT EXTRACTION W/PHACO Left 09/25/2022   Procedure: CATARACT EXTRACTION PHACO AND INTRAOCULAR LENS PLACEMENT (IOC) LEFT  5.35  00:34.4;  Surgeon: Nevada Crane, MD;  Location: [facility];  Service: Ophthalmology;  Laterality: Left;   CERVICAL BIOPSY  W/ LOOP ELECTRODE EXCISION  2016   COLONOSCOPY WITH PROPOFOL N/A 11/11/2021   Procedure: COLONOSCOPY WITH PROPOFOL;  Surgeon: Toney Reil, MD;  Location: [facility];  Service: Gastroenterology;  Laterality: N/A;   ESOPHAGOGASTRODUODENOSCOPY (EGD) WITH PROPOFOL N/A 10/27/2019   Procedure: ESOPHAGOGASTRODUODENOSCOPY (EGD) WITH PROPOFOL;  Surgeon: Pasty Spillers, MD;  Location: [facility];  Service: Endoscopy;  Laterality: N/A;   TUBAL LIGATION     Patient Active Problem List   Diagnosis Date Noted   Pain and swelling of eyelid of right eye  04/10/2023   Acute foot pain, right 03/21/2023   Fall 03/21/2023   Preventative health care 07/25/2022   History of colonic polyps    Cecal polyp    Prediabetes 07/22/2021   Other fatigue 06/02/2021   Eczema 07/09/2020   Balance problem 10/15/2019   Nausea 05/27/2019   GERD (gastroesophageal reflux disease) 10/09/2018   Screening for cervical cancer 08/01/2018   Medicare annual wellness visit, subsequent 05/13/2018   Hyperlipidemia 05/08/2017   Essential hypertension 05/08/2017   Bipolar disorder (HCC) 05/08/2017   Rosacea 05/08/2017    PCP: Doreene Nest, NP  REFERRING PROVIDER: Hannah Beat, MD  REFERRING DIAG: M25.511 (ICD-10-CM) - Acute pain of right shoulder   THERAPY DIAG:  Acute pain of right shoulder  Muscle weakness (generalized)  Rationale for Evaluation and Treatment: Rehabilitation  ONSET DATE: 02/11/2024  SUBJECTIVE:  SUBJECTIVE STATEMENT:  09/03/2023  Pt reports no pain in Bil shoulders at start of PT session. Was able to move things in the house a little without pain over the weekend    From Eval Pt reports that she had a fall back in [place] and that was the first of 3 falls that occurred close together.  Pt notes she fell off the deck on the first time and landed on the R shoulder and R hip.  Pt has noted having increased pain and discomfort since that time.  Pt reports she has had to changed her sleeping habits, because she cannot sleep on her back due to the pain it causes in her R shoulder.    Hand dominance: Right  PERTINENT HISTORY: Pt with prior history of arthritis and bursitis in the R hip which was treated effectively with PT.  Pt had shoulder pain that was manageable from the fall that hurt her hip, however has progressed over the past 2-3 weeks.   Pt notes that pain to be less tolerable and the tylenol and arthritis cream to not being as effective as it was prior.  Pt's MD noted it may be a RTC tear or some involvement.  PAIN:  Are you having pain? Yes: NPRS scale: 5-6/10 Pain location: R shoulder, anterior and down into the biceps region Pain description: Sharp shooting pain Aggravating factors: lifting arm, using hands (feels as though she can't use hand or has no control of her hands) Relieving factors: Pt reports the pain to always be present, but does use tylenol and arthritis cream will dull the pain.  PRECAUTIONS: Fall  RED FLAGS: None   WEIGHT BEARING RESTRICTIONS: No  FALLS:  Has patient fallen in last 6 months? Yes. Number of falls 3  LIVING ENVIRONMENT: Lives with: lives with their spouse Lives in: House/apartment Stairs: Yes: External: 3 steps; on right going up and on left going up Has following equipment at home: Single point cane  OCCUPATION: Not currently working  PLOF: Independent  PATIENT GOALS:  Pt wants to be able to use the shoulder more and reduce the pain she is experiencing.  NEXT MD VISIT:   OBJECTIVE:  Note: Objective measures were completed at Evaluation unless otherwise noted.  DIAGNOSTIC FINDINGS:  No imaging of the shoulder.  PATIENT SURVEYS:  Quick Dash 43.2%  COGNITION: Overall cognitive status: Within functional limits for tasks assessed     SENSATION: Pt with reported numbness/tingling in the R hand  POSTURE: Pt with typical forward head and rounded shoulders in sitting.    UPPER EXTREMITY ROM:   Active ROM Right eval Left eval  Shoulder flexion 130* 145  Shoulder extension    Shoulder abduction 96 156  Shoulder adduction    Shoulder internal rotation    Shoulder external rotation    Elbow flexion WNL WNL  Elbow extension WNL WNL  Wrist flexion    Wrist extension    Wrist ulnar deviation    Wrist radial deviation    Wrist pronation    Wrist supination     (Blank rows = not tested)  UPPER EXTREMITY MMT:  MMT Right eval Left eval  Shoulder flexion 4* 4+  Shoulder extension    Shoulder abduction 3+* 4-*  Shoulder adduction    Shoulder internal rotation    Shoulder external rotation    Middle trapezius    Lower trapezius    Elbow flexion 4* 4+  Elbow extension 4* 4+  Wrist flexion  Wrist extension    Wrist ulnar deviation    Wrist radial deviation    Wrist pronation    Wrist supination    Grip strength (lbs)    (Blank rows = not tested)  SHOULDER SPECIAL TESTS: Impingement tests: Neer impingement test: negative, Hawkins/Kennedy impingement test: positive , and Painful arc test: negative SLAP lesions:  Not performed Rotator cuff assessment: Empty can test: positive , Full can test: positive , External rotation lag sign: positive , and Belly press test: positive  Biceps assessment: Yergason's test: negative and Speed's test: positive   JOINT MOBILITY TESTING:  Pt with good joint mobility, however notes increased pain with AP mobilization of the shoulder, specifically  PALPATION:  Pt with no palpable TP's or noticeable pain when palpating the RTC musculature.                                                                                                                               TREATMENT DATE: 09/03/23  TE.   UBE forward 2 min. Reverse 1.5 min; 30 sec rest break between bouts.   Seated:  UT stretch 2 x 20 sec bil  Seated levator stretch 2 x 20sec  Reverse Shoulder rolls with hold down and back for 2 sec x 10  Shoulder ER with scapula retraction; RTB x 10 with 2 sec hold  Shoulder abduction x 12  Horizontal shoulder abduction x 5 with mild pain posterior shoulder.  Seated bicep curl RTB x 12 bil   Scapula retraction with shoulder in ER. X 10 - no pain.  Shoulder flexion in pain free range with RTB x 10 bil   Standing  Shoulder extension, RTB  x 10  Low row RTB x 12  Mid row RTB x 10    Manual therapy:   STM to posterior  deltoid,  teres major, infraspinatus, and UT for TP release and cross frinctional massage for improved ROM and reduced pain x 8 min   Pt reports no pain upon completion of manual therapy.    PATIENT EDUCATION: Education details: Pt educated throughout session about proper posture and technique with exercises. Improved exercise technique, movement at target joints, use of target muscles after min to mod verbal, visual, tactile cues  Person educated: Patient Education method: Explanation and Demonstration Education comprehension: verbalized understanding  HOME EXERCISE PROGRAM:  Access Code: [email or web address] URL: [email or web address] Date: 08/23/2023 Prepared by: Grier Rocher  Exercises - Isometric Shoulder Flexion at Wall  - 1 x daily - 7 x weekly - 3 sets - 10 reps - 3 hold - Isometric Shoulder Abduction at Wall  - 1 x daily - 7 x weekly - 3 sets - 10 reps - 3 hold - Isometric Shoulder Adduction  - 1 x daily - 7 x weekly - 3 sets - 10 reps - 3 hold - Isometric Shoulder Extension at Wall  - 1 x daily - 7 x weekly - 3 sets - 10  reps - 3 hold - Seated Shoulder Rolls  - 1 x daily - 7 x weekly - 3 sets - 10 reps - Seated Scapular Retraction  - 1 x daily - 7 x weekly - 3 sets - 10 reps - 3 hold - Seated Single Arm Shoulder External Rotation  - 1 x daily - 7 x weekly - 3 sets - 10 reps  ASSESSMENT:  CLINICAL IMPRESSION: Patient is a 63 y.o. female who was seen today for physical therapy treatment for R shoulder pain. PT increased resistance and movement direction to increase demand on muscular tissue. PT treatment focused on improved parascapular activation and reduced UT compensation in all direction. STM for pain management, tolerated very well and reports no pain upon completion. Pt will continue to benefit from skilled PT to address pain and strength deficits. .    OBJECTIVE IMPAIRMENTS: decreased activity tolerance, decreased ROM, decreased strength,  hypomobility, impaired UE functional use, obesity, and pain.   ACTIVITY LIMITATIONS: carrying, lifting, and reach over head  PARTICIPATION LIMITATIONS: meal prep, cleaning, laundry, driving, shopping, community activity, and yard work  PERSONAL FACTORS: Age, Fitness, Past/current experiences, Time since onset of injury/illness/exacerbation, and 3+ comorbidities: HTN, bipolar disorder, falls, prediabetic  are also affecting patient's functional outcome.   REHAB POTENTIAL: Good  CLINICAL DECISION MAKING: Evolving/moderate complexity  EVALUATION COMPLEXITY: Moderate   GOALS: Goals reviewed with patient? Yes  SHORT TERM GOALS: Target date: 09/18/2023  Pt will be independent with HEP in order to demonstrate increased ability to perform tasks related to occupation/hobbies. Baseline:  Pt given isometric exercises as listed above Goal status: INITIAL   LONG TERM GOALS: Target date: 11/13/2023  Pt will decrease quick DASH score by at least 8% in order to demonstrate clinically significant reduction in disability.  Baseline: 43.2% Goal status: INITIAL  2.  Pt will reduce overall pain level to 0/10 by utilizing a combination of stretching, strengthening exercises, and pain-reducing modalities in order to improve overall QoL. Baseline: 5-6/10 at rest Goal status: INITIAL  3.  Pt will improve R shoulder flexion AROM to be ~to L shoulder AROM (140 deg) in order to be able to reach into upper cabinets within kitchen. Baseline: 130 deg with pain Goal status: INITIAL  4.  Pt will increase strength by at least 1/2 MMT grade in order to demonstrate improvement in strength and function       Baseline: Globally 4-/5 on the R shoulder with significant pain in all planes of mobility Goal status: INITIAL   PLAN:  PT FREQUENCY: 2x/week  PT DURATION: 8 weeks  PLANNED INTERVENTIONS: 97110-Therapeutic exercises, 97530- Therapeutic activity, 97112- Neuromuscular re-education, 97535- Self Care,  97140- Manual therapy, Dry Needling, and Joint mobilization  PLAN FOR NEXT SESSION:   manual therapy to improve ROM within tolerable ROM,  Therex for ROM and parascapular strengthening.  discuss dry needling for painful regions, continue assessment of potential shoulder pathologies.     Grier Rocher PT, DPT  Physical Therapist - [place]  [facility]  7:55 AM 09/03/23

## 2023-09-05 ENCOUNTER — Ambulatory Visit: Admitting: Physical Therapy

## 2023-09-10 ENCOUNTER — Ambulatory Visit: Admitting: Physical Therapy

## 2023-09-10 DIAGNOSIS — G8929 Other chronic pain: Secondary | ICD-10-CM

## 2023-09-10 DIAGNOSIS — M6281 Muscle weakness (generalized): Secondary | ICD-10-CM

## 2023-09-10 DIAGNOSIS — M25511 Pain in right shoulder: Secondary | ICD-10-CM | POA: Diagnosis not present

## 2023-09-10 DIAGNOSIS — R262 Difficulty in walking, not elsewhere classified: Secondary | ICD-10-CM

## 2023-09-10 DIAGNOSIS — R2681 Unsteadiness on feet: Secondary | ICD-10-CM

## 2023-09-10 NOTE — Therapy (Signed)
 OUTPATIENT PHYSICAL THERAPY SHOULDER treatment/ Discharge Summary    Patient Name: Katherine Carpenter MRN: [id number] DOB:[birth year], 63 y.o., female Today's Date: 09/10/2023  END OF SESSION:  PT End of Session - 09/10/23 0756     Visit Number 5    Number of Visits 17    Date for PT Re-Evaluation 10/16/23    Progress Note Due on Visit 10    PT Start Time 0801    PT Stop Time 0841    PT Time Calculation (min) 40 min    Activity Tolerance Patient tolerated treatment well    Behavior During Therapy [facility] for tasks assessed/performed             Past Medical History:  Diagnosis Date   Arthritis    osteo   Bipolar 1 disorder (HCC)    Depression    GERD (gastroesophageal reflux disease)    Hypertension    PONV (postoperative nausea and vomiting)    Sleep apnea    CPAP   Past Surgical History:  Procedure Laterality Date   CATARACT EXTRACTION W/PHACO Right 09/11/2022   Procedure: CATARACT EXTRACTION PHACO AND INTRAOCULAR LENS PLACEMENT (IOC) RIGHT  2.97  00:21.1;  Surgeon: Nevada Crane, MD;  Location: [facility];  Service: Ophthalmology;  Laterality: Right;   CATARACT EXTRACTION W/PHACO Left 09/25/2022   Procedure: CATARACT EXTRACTION PHACO AND INTRAOCULAR LENS PLACEMENT (IOC) LEFT  5.35  00:34.4;  Surgeon: Nevada Crane, MD;  Location: [facility];  Service: Ophthalmology;  Laterality: Left;   CERVICAL BIOPSY  W/ LOOP ELECTRODE EXCISION  2016   COLONOSCOPY WITH PROPOFOL N/A 11/11/2021   Procedure: COLONOSCOPY WITH PROPOFOL;  Surgeon: Toney Reil, MD;  Location: [facility];  Service: Gastroenterology;  Laterality: N/A;   ESOPHAGOGASTRODUODENOSCOPY (EGD) WITH PROPOFOL N/A 10/27/2019   Procedure: ESOPHAGOGASTRODUODENOSCOPY (EGD) WITH PROPOFOL;  Surgeon: Pasty Spillers, MD;  Location: [facility];  Service: Endoscopy;  Laterality: N/A;   TUBAL LIGATION     Patient Active Problem List   Diagnosis Date Noted   Pain and swelling of  eyelid of right eye 04/10/2023   Acute foot pain, right 03/21/2023   Fall 03/21/2023   Preventative health care 07/25/2022   History of colonic polyps    Cecal polyp    Prediabetes 07/22/2021   Other fatigue 06/02/2021   Eczema 07/09/2020   Balance problem 10/15/2019   Nausea 05/27/2019   GERD (gastroesophageal reflux disease) 10/09/2018   Screening for cervical cancer 08/01/2018   Medicare annual wellness visit, subsequent 05/13/2018   Hyperlipidemia 05/08/2017   Essential hypertension 05/08/2017   Bipolar disorder (HCC) 05/08/2017   Rosacea 05/08/2017    PCP: Doreene Nest, NP  REFERRING PROVIDER: Hannah Beat, MD  REFERRING DIAG: M25.511 (ICD-10-CM) - Acute pain of right shoulder   THERAPY DIAG:  Acute pain of right shoulder  Muscle weakness (generalized)  Unsteadiness on feet  Chronic pain of both knees  Difficulty in walking, not elsewhere classified  Rationale for Evaluation and Treatment: Rehabilitation  ONSET DATE: 02/11/2024  SUBJECTIVE:  SUBJECTIVE STATEMENT:  09/10/2023  Pt reports that she continues to have no pain in Bil shoulders. Was able to continue to move things to get ready to move in with aging parent. Feels like she is ready to d/c from PT as well as needing stop PT due to move 45 min away.    From Eval Pt reports that she had a fall back in [place] and that was the first of 3 falls that occurred close together.  Pt notes she fell off the deck on the first time and landed on the R shoulder and R hip.  Pt has noted having increased pain and discomfort since that time.  Pt reports she has had to changed her sleeping habits, because she cannot sleep on her back due to the pain it causes in her R shoulder.    Hand dominance: Right  PERTINENT  HISTORY: Pt with prior history of arthritis and bursitis in the R hip which was treated effectively with PT.  Pt had shoulder pain that was manageable from the fall that hurt her hip, however has progressed over the past 2-3 weeks.  Pt notes that pain to be less tolerable and the tylenol and arthritis cream to not being as effective as it was prior.  Pt's MD noted it may be a RTC tear or some involvement.  PAIN:  Are you having pain? Yes: NPRS scale: 5-6/10 Pain location: R shoulder, anterior and down into the biceps region Pain description: Sharp shooting pain Aggravating factors: lifting arm, using hands (feels as though she can't use hand or has no control of her hands) Relieving factors: Pt reports the pain to always be present, but does use tylenol and arthritis cream will dull the pain.  PRECAUTIONS: Fall  RED FLAGS: None   WEIGHT BEARING RESTRICTIONS: No  FALLS:  Has patient fallen in last 6 months? Yes. Number of falls 3  LIVING ENVIRONMENT: Lives with: lives with their spouse Lives in: House/apartment Stairs: Yes: External: 3 steps; on right going up and on left going up Has following equipment at home: Single point cane  OCCUPATION: Not currently working  PLOF: Independent  PATIENT GOALS:  Pt wants to be able to use the shoulder more and reduce the pain she is experiencing.  NEXT MD VISIT:   OBJECTIVE:  Note: Objective measures were completed at Evaluation unless otherwise noted.  DIAGNOSTIC FINDINGS:  No imaging of the shoulder.  PATIENT SURVEYS:  Quick Dash 43.2%  COGNITION: Overall cognitive status: Within functional limits for tasks assessed     SENSATION: Pt with reported numbness/tingling in the R hand  POSTURE: Pt with typical forward head and rounded shoulders in sitting.    UPPER EXTREMITY ROM:   Active ROM Right eval Left eval R 3/31 L 3/31  Shoulder flexion 130* 145 170 170  Shoulder extension      Shoulder abduction 96 156 165 169   Shoulder adduction      Shoulder internal rotation      Shoulder external rotation      Elbow flexion WNL WNL    Elbow extension WNL WNL    Wrist flexion      Wrist extension      Wrist ulnar deviation      Wrist radial deviation      Wrist pronation      Wrist supination      (Blank rows = not tested)  UPPER EXTREMITY MMT:  3/31 MMT Right eval Left eval  Shoulder flexion  4 4+  Shoulder extension    Shoulder abduction 4 4+  Shoulder adduction    Shoulder internal rotation    Shoulder external rotation    Middle trapezius    Lower trapezius    Elbow flexion 4+ 4+  Elbow extension 4+ 4+  Wrist flexion    Wrist extension    Wrist ulnar deviation    Wrist radial deviation    Wrist pronation    Wrist supination    Grip strength (lbs)    (Blank rows = not tested)  SHOULDER SPECIAL TESTS: Impingement tests: Neer impingement test: negative, Hawkins/Kennedy impingement test: positive , and Painful arc test: negative SLAP lesions:  Not performed Rotator cuff assessment: Empty can test: positive , Full can test: positive , External rotation lag sign: positive , and Belly press test: positive  Biceps assessment: Yergason's test: negative and Speed's test: positive   JOINT MOBILITY TESTING:  Pt with good joint mobility, however notes increased pain with AP mobilization of the shoulder, specifically  PALPATION:  Pt with no palpable TP's or noticeable pain when palpating the RTC musculature.                                                                                                                               TREATMENT DATE: 09/10/23  TE.   ROM assessment in supine. See above.   Seated:  UT stretch 2 x 20 sec bil  Reverse Shoulder rolls with hold down and back for 2 sec x 10  Shoulder ER with scapula retraction; YTB x 10 with 2 sec hold  Wall angle - Shoulder abduction x 12  Seated bicep curl RTB x 12 bil   Shoulder flexion in pain free range with YTB x 10 bil   Shoulder rolls x 15 with  Scapula retraction x 15 bil AROM   Standing  Shoulder extension, YTB  x 10  Low row YTB x 12  High row YTB x 10   Pt reports no pain throughout PT session.   PATIENT EDUCATION: Education details: Pt educated throughout session about proper posture and technique with exercises. Improved exercise technique, movement at target joints, use of target muscles after min to mod verbal, visual, tactile cues  Person educated: Patient Education method: Explanation and Demonstration Education comprehension: verbalized understanding  HOME EXERCISE PROGRAM:  Access Code: [email or web address] URL: [email or web address] Date: 09/10/2023 Prepared by: Grier Rocher  Exercises - Isometric Shoulder Abduction at Wall  - 1 x daily - 7 x weekly - 3 sets - 10 reps - 3 hold - Isometric Shoulder Adduction  - 1 x daily - 7 x weekly - 3 sets - 10 reps - 3 hold - Seated Shoulder Rolls  - 1 x daily - 7 x weekly - 3 sets - 10 reps - Seated Scapular Retraction  - 1 x daily - 7 x weekly - 3 sets - 10 reps - 3 hold - Seated Upper Trapezius Stretch  -  1 x daily - 7 x weekly - 3 sets - 4 reps - 20 hold - Standing Shoulder External Rotation with Resistance  - 1 x daily - 7 x weekly - 3 sets - 10 reps - Standing Bilateral Low Shoulder Row with Anchored Resistance  - 1 x daily - 7 x weekly - 3 sets - 10 reps - Seated Shoulder Flexion with Self-Anchored Resistance  - 1 x daily - 7 x weekly - 3 sets - 10 reps - Wall Angels  - 1 x daily - 7 x weekly - 3 sets - 10 reps - Standing High Shoulder Row with Anchored Resistance  - 1 x daily - 7 x weekly - 3 sets - 10 reps  ASSESSMENT:  CLINICAL IMPRESSION: Patient is a 63 y.o. female who was seen today for physical therapy treatment for R shoulder pain. Pt has had no pain for the last 2-3 PT treatments while being very physically active at home packing and moving boxes in preparation of moving in with aging parent.  PT assessed ROM and all goals  meeting 4/4 LTG and 1/1 STG. HEP review and expansion without pain or difficulty. Instructed on proper use of Bands to progress resistance from Yellow band to the R. Due to reduced pain, improved ROM, increased function, and travel concerns with moving, pt will no longer need skilled PT.     OBJECTIVE IMPAIRMENTS: decreased activity tolerance, decreased ROM, decreased strength, hypomobility, impaired UE functional use, obesity, and pain.   ACTIVITY LIMITATIONS: carrying, lifting, and reach over head  PARTICIPATION LIMITATIONS: meal prep, cleaning, laundry, driving, shopping, community activity, and yard work  PERSONAL FACTORS: Age, Fitness, Past/current experiences, Time since onset of injury/illness/exacerbation, and 3+ comorbidities: HTN, bipolar disorder, falls, prediabetic  are also affecting patient's functional outcome.   REHAB POTENTIAL: Good  CLINICAL DECISION MAKING: Evolving/moderate complexity  EVALUATION COMPLEXITY: Moderate   GOALS: Goals reviewed with patient? Yes  SHORT TERM GOALS: Target date: 09/18/2023  Pt will be independent with HEP in order to demonstrate increased ability to perform tasks related to occupation/hobbies. Baseline:  Pt given isometric exercises as listed above Goal status: INITIAL   LONG TERM GOALS: Target date: 11/13/2023  Pt will decrease quick DASH score by at least 8% in order to demonstrate clinically significant reduction in disability.  Baseline: 43.2% 2% Goal status: MEY  2.  Pt will reduce overall pain level to 0/10 by utilizing a combination of stretching, strengthening exercises, and pain-reducing modalities in order to improve overall QoL. Baseline: 5-6/10 at rest 0/10 to 1/10  Goal status: MET  3.  Pt will improve R shoulder flexion AROM to be ~to L shoulder AROM (140 deg) in order to be able to reach into upper cabinets within kitchen. Baseline: 130 deg with pain 170deg  Goal status: MET  4.  Pt will increase strength by at  least 1/2 MMT grade in order to demonstrate improvement in strength and function       Baseline: Globally 4-/5 on the R shoulder with significant pain in all planes of mobility 3/31: grossly 4/5 with on pain on the RUE.  Goal status: Met     PLAN:  PT FREQUENCY: 2x/week  PT DURATION: 8 weeks  PLANNED INTERVENTIONS: 97110-Therapeutic exercises, 97530- Therapeutic activity, 97112- Neuromuscular re-education, 97535- Self Care, 16109- Manual therapy, Dry Needling, and Joint mobilization  PLAN FOR NEXT SESSION:   NA   Grier Rocher PT, DPT  Physical Therapist - [place]  [facility]  7:58 AM 09/10/23

## 2023-09-14 ENCOUNTER — Ambulatory Visit: Admitting: Physical Therapy

## 2023-09-17 ENCOUNTER — Ambulatory Visit: Admitting: Physical Therapy

## 2023-09-17 ENCOUNTER — Other Ambulatory Visit: Payer: Self-pay | Admitting: Primary Care

## 2023-09-17 DIAGNOSIS — I10 Essential (primary) hypertension: Secondary | ICD-10-CM

## 2023-09-21 ENCOUNTER — Encounter: Admitting: Physical Therapy

## 2023-10-09 DIAGNOSIS — Z113 Encounter for screening for infections with a predominantly sexual mode of transmission: Secondary | ICD-10-CM | POA: Diagnosis not present

## 2023-10-17 ENCOUNTER — Encounter: Payer: Self-pay | Admitting: Primary Care

## 2023-10-17 ENCOUNTER — Ambulatory Visit (INDEPENDENT_AMBULATORY_CARE_PROVIDER_SITE_OTHER): Admitting: Primary Care

## 2023-10-17 VITALS — BP 122/80 | HR 88 | Temp 97.1°F | Ht 63.0 in | Wt 226.0 lb

## 2023-10-17 DIAGNOSIS — J069 Acute upper respiratory infection, unspecified: Secondary | ICD-10-CM | POA: Diagnosis not present

## 2023-10-17 LAB — POC COVID19 BINAXNOW: SARS Coronavirus 2 Ag: NEGATIVE

## 2023-10-17 MED ORDER — BENZONATATE 200 MG PO CAPS: 200.0000 mg | ORAL_CAPSULE | Freq: Three times a day (TID) | ORAL | 0 refills | Status: DC | PRN

## 2023-10-17 NOTE — Patient Instructions (Signed)
 Start Benzonatate capsules for cough. Take 1 capsule by mouth three times daily as needed for cough.  Please update me early next week!  It was a pleasure to see you today!

## 2023-10-17 NOTE — Assessment & Plan Note (Signed)
 Presentation and HPI today suggestive of viral etiology at this point.  Rapid COVID test negative today. Treat with conservative care.   Start Benzonatate capsules for cough. Take 1 capsule by mouth three times daily as needed for cough. She will update early next week if symptoms have not improved.

## 2023-10-17 NOTE — Progress Notes (Signed)
 Subjective:    Patient ID: Katherine Carpenter, female    DOB: Sep 17, 1960, 63 y.o.   MRN: [id number]  Cough Associated symptoms include a sore throat. Pertinent negatives include no chest pain, chills, ear pain, fever or shortness of breath.    Katherine Carpenter is a very pleasant 63 y.o. female with a history of hypertension, hyperlipidemia, prediabetes who presents today to discuss acute cough.  Symptom onset 2 days ago with a cough and scratchy throat. She then noticed progression of these symptoms. Yesterday she began to feel achy, noticed coughing fits. She feels tired today.   She denies fevers, shortness of breath, wheezing.  She's been taking Alka-Seltzer Plus.   Her husband has similar symptoms which began 5 days prior.   Review of Systems  Constitutional:  Positive for fatigue. Negative for chills and fever.  HENT:  Positive for sore throat. Negative for ear pain.   Respiratory:  Positive for cough. Negative for shortness of breath.   Cardiovascular:  Negative for chest pain.         Past Medical History:  Diagnosis Date   Arthritis    osteo   Bipolar 1 disorder (HCC)    Depression    GERD (gastroesophageal reflux disease)    Hypertension    PONV (postoperative nausea and vomiting)    Sleep apnea    CPAP    Social History   Socioeconomic History   Marital status: Married    Spouse name: Not on file   Number of children: Not on file   Years of education: Not on file   Highest education level: Some college, no degree  Occupational History   Not on file  Tobacco Use   Smoking status: Never   Smokeless tobacco: Never  Vaping Use   Vaping status: Never Used  Substance and Sexual Activity   Alcohol use: No    Comment: Pt denies   Drug use: No    Comment: Pt denies   Sexual activity: Not on file  Other Topics Concern   Not on file  Social History Narrative   Not on file   Social Drivers of Health   Financial Resource Strain: Low Risk  (10/16/2023)    Overall Financial Resource Strain (CARDIA)    Difficulty of Paying Living Expenses: Not hard at all  Food Insecurity: No Food Insecurity (10/16/2023)   Hunger Vital Sign    Worried About Running Out of Food in the Last Year: Never true    Ran Out of Food in the Last Year: Never true  Transportation Needs: No Transportation Needs (10/16/2023)   PRAPARE - Administrator, Civil Service (Medical): No    Lack of Transportation (Non-Medical): No  Physical Activity: Insufficiently Active (10/16/2023)   Exercise Vital Sign    Days of Exercise per Week: 3 days    Minutes of Exercise per Session: 40 min  Stress: No Stress Concern Present (10/16/2023)   [employer] of Occupational Health - Occupational Stress Questionnaire    Feeling of Stress : Not at all  Social Connections: Socially Integrated (10/16/2023)   Social Connection and Isolation Panel [NHANES]    Frequency of Communication with Friends and Family: More than three times a week    Frequency of Social Gatherings with Friends and Family: Once a week    Attends Religious Services: More than 4 times per year    Active Member of [employer] or Organizations: Yes    Attends Banker  Meetings: More than 4 times per year    Marital Status: Married  Intimate Partner Violence: Not At Risk (01/15/2023)   Humiliation, Afraid, Rape, and Kick questionnaire    Fear of Current or Ex-Partner: No    Emotionally Abused: No    Physically Abused: No    Sexually Abused: No    Past Surgical History:  Procedure Laterality Date   CATARACT EXTRACTION W/PHACO Right 09/11/2022   Procedure: CATARACT EXTRACTION PHACO AND INTRAOCULAR LENS PLACEMENT (IOC) RIGHT  2.97  00:21.1;  Surgeon: Rosa College, MD;  Location: [facility];  Service: Ophthalmology;  Laterality: Right;   CATARACT EXTRACTION W/PHACO Left 09/25/2022   Procedure: CATARACT EXTRACTION PHACO AND INTRAOCULAR LENS PLACEMENT (IOC) LEFT  5.35  00:34.4;  Surgeon: Rosa College, MD;  Location: [facility];  Service: Ophthalmology;  Laterality: Left;   CERVICAL BIOPSY  W/ LOOP ELECTRODE EXCISION  2016   COLONOSCOPY WITH PROPOFOL  N/A 11/11/2021   Procedure: COLONOSCOPY WITH PROPOFOL ;  Surgeon: Selena Daily, MD;  Location: [facility];  Service: Gastroenterology;  Laterality: N/A;   ESOPHAGOGASTRODUODENOSCOPY (EGD) WITH PROPOFOL  N/A 10/27/2019   Procedure: ESOPHAGOGASTRODUODENOSCOPY (EGD) WITH PROPOFOL ;  Surgeon: Irby Mannan, MD;  Location: [facility];  Service: Endoscopy;  Laterality: N/A;   TUBAL LIGATION      Family History  Problem Relation Age of Onset   Asthma Mother    Depression Daughter    Arthritis Maternal Grandmother    Cancer Maternal Grandmother        lung   Cancer Paternal Grandmother    Leukemia Paternal Grandmother    Breast cancer Neg Hx     Allergies  Allergen Reactions   Codeine Other (See Comments)    Current Outpatient Medications on File Prior to Visit  Medication Sig Dispense Refill   ALPRAZolam  (XANAX ) 1 MG tablet Take 1 mg by mouth 5 (five) times daily. Patient may take up to 5 times a day / one in am and 2 in evening     amphetamine-dextroamphetamine (ADDERALL) 15 MG tablet Take 2 tablets by mouth daily.     buPROPion  (WELLBUTRIN  XL) 300 MG 24 hr tablet Take 300 mg by mouth every morning.     Calcium  Carbonate-Vitamin D  500-125 MG-UNIT TABS Take by mouth in the morning and at bedtime.     carbamazepine  (TEGRETOL ) 200 MG tablet TAKE ONE TABLET BY MOUTH EVERY MORNING AND THREE TABLETS AT BEDTIME     cetirizine (ZYRTEC) 10 MG tablet Take 10 mg by mouth daily.     Cholecalciferol (VITAMIN D3) 50 MCG (2000 UT) capsule Take 2,000 Units by mouth daily.     doxycycline  (PERIOSTAT ) 20 MG tablet Take 1 tablet (20 mg total) by mouth 2 (two) times daily with a meal. 60 tablet 3   Dupilumab  (DUPIXENT ) 300 MG/2ML SOAJ INJECT 1 PEN (300MG ) UNDER THE SKIN (SUBCUTANEOUS INJECTION) EVERY 14 DAYS 4 mL 5    FLUoxetine  (PROZAC ) 40 MG capsule Take 40 mg by mouth at bedtime.     lamoTRIgine  (LAMICTAL ) 200 MG tablet 2 (two) times daily.     metoprolol  succinate (TOPROL -XL) 25 MG 24 hr tablet TAKE 1/2 (ONE-HALF) TABLET BY MOUTH ONCE DAILY FOR  HEARTRATE  AND  BLOOD  PRESSURE 45 tablet 2   metroNIDAZOLE  (METROGEL ) 0.75 % gel APPLY APPLICATION TOPICALLY TWO (2) TIMES DAILY. 45 g 6   Multiple Vitamin (MULTI-VITAMINS) TABS Take by mouth in the morning and at bedtime.     OLANZapine  (ZYPREXA ) 10  MG tablet Take 10 mg by mouth at bedtime.     olanzapine -FLUoxetine  (SYMBYAX ) 12-25 MG per capsule Take 1 capsule by mouth every evening.     omeprazole  (PRILOSEC) 40 MG capsule Take 1 capsule (40 mg total) by mouth daily. For heartburn. Office visit required for further refills. 90 capsule 0   Probiotic Product (PROBIOTIC DAILY PO) Take by mouth. Takes 2 QD     simvastatin  (ZOCOR ) 10 MG tablet TAKE 1 TABLET BY MOUTH ONCE DAILY FOR CHOLESTEROL 90 tablet 3   No current facility-administered medications on file prior to visit.    BP 122/80   Pulse 88   Temp (!) 97.1 F (36.2 C) (Temporal)   Ht 5\' 3"  (1.6 m)   Wt 226 lb (102.5 kg)   LMP 09/08/2011   SpO2 98%   BMI 40.03 kg/m  Objective:   Physical Exam Constitutional:      Appearance: She is ill-appearing.  HENT:     Right Ear: Tympanic membrane and ear canal normal.     Left Ear: Tympanic membrane and ear canal normal.     Nose: No mucosal edema.     Right Sinus: No maxillary sinus tenderness or frontal sinus tenderness.     Left Sinus: No maxillary sinus tenderness or frontal sinus tenderness.     Mouth/Throat:     Mouth: Mucous membranes are moist.  Eyes:     Conjunctiva/sclera: Conjunctivae normal.  Cardiovascular:     Rate and Rhythm: Normal rate and regular rhythm.  Pulmonary:     Effort: Pulmonary effort is normal.     Breath sounds: Normal breath sounds. No wheezing or rhonchi.     Comments: Dry cough noted several times during visit   Musculoskeletal:     Cervical back: Neck supple.  Skin:    General: Skin is warm and dry.           Assessment & Plan:  Viral URI with cough Assessment & Plan: Presentation and HPI today suggestive of viral etiology at this point.  Rapid COVID test negative today. Treat with conservative care.   Start Benzonatate capsules for cough. Take 1 capsule by mouth three times daily as needed for cough. She will update early next week if symptoms have not improved.  Orders: -     POC COVID-19 BinaxNow -     Benzonatate; Take 1 capsule (200 mg total) by mouth 3 (three) times daily as needed for cough.  Dispense: 15 capsule; Refill: 0        Gabriel John, NP

## 2023-10-22 DIAGNOSIS — R051 Acute cough: Secondary | ICD-10-CM

## 2023-10-22 MED ORDER — AMOXICILLIN-POT CLAVULANATE 875-125 MG PO TABS
1.0000 | ORAL_TABLET | Freq: Two times a day (BID) | ORAL | 0 refills | Status: DC
Start: 2023-10-22 — End: 2023-10-22

## 2023-10-22 MED ORDER — AMOXICILLIN-POT CLAVULANATE 875-125 MG PO TABS: 1.0000 | ORAL_TABLET | Freq: Two times a day (BID) | ORAL | 0 refills | Status: DC

## 2023-11-01 ENCOUNTER — Ambulatory Visit: Admitting: Primary Care

## 2023-11-20 DIAGNOSIS — G4733 Obstructive sleep apnea (adult) (pediatric): Secondary | ICD-10-CM | POA: Diagnosis not present

## 2023-12-09 ENCOUNTER — Other Ambulatory Visit: Payer: Self-pay | Admitting: Dermatology

## 2023-12-09 DIAGNOSIS — L719 Rosacea, unspecified: Secondary | ICD-10-CM

## 2023-12-10 ENCOUNTER — Other Ambulatory Visit: Payer: Self-pay

## 2023-12-10 DIAGNOSIS — L309 Dermatitis, unspecified: Secondary | ICD-10-CM

## 2023-12-10 MED ORDER — DUPIXENT 300 MG/2ML ~~LOC~~ SOAJ: 2.0000 mL | SUBCUTANEOUS | 1 refills | Status: DC

## 2023-12-10 NOTE — Progress Notes (Signed)
 Fax from [facility] requesting 3 month supply of Dupixent . 90 day supply and 1 refill ERX sent.

## 2024-01-21 ENCOUNTER — Ambulatory Visit

## 2024-01-21 VITALS — BP 122/80 | Ht 63.0 in | Wt 231.0 lb

## 2024-01-21 DIAGNOSIS — Z0000 Encounter for general adult medical examination without abnormal findings: Secondary | ICD-10-CM

## 2024-01-21 DIAGNOSIS — Z2821 Immunization not carried out because of patient refusal: Secondary | ICD-10-CM

## 2024-01-21 NOTE — Progress Notes (Signed)
 Because this visit was a virtual/telehealth visit,  certain criteria was not obtained, such a blood pressure, CBG if applicable, and timed get up and go. Any medications not marked as taking were not mentioned during the medication reconciliation part of the visit. Any vitals not documented were not able to be obtained due to this being a telehealth visit or patient was unable to self-report a recent blood pressure reading due to a lack of equipment at home via telehealth. Vitals that have been documented are verbally provided by the patient.  This visit was performed by a medical professional under my direct supervision. I was immediately available for consultation/collaboration. I have reviewed and agree with the Annual Wellness Visit documentation.  Subjective:   Katherine Carpenter is a 63 y.o. who presents for a Medicare Wellness preventive visit.  As a reminder, Annual Wellness Visits don't include a physical exam, and some assessments may be limited, especially if this visit is performed virtually. We may recommend an in-person follow-up visit with your provider if needed.  Visit Complete: Virtual I connected with  Katherine Carpenter on 01/21/24 by a audio enabled telemedicine application and verified that I am speaking with the correct person using two identifiers.  Patient Location: Home  Provider Location: Home Office  I discussed the limitations of evaluation and management by telemedicine. The patient expressed understanding and agreed to proceed.  Vital Signs: Because this visit was a virtual/telehealth visit, some criteria may be missing or patient reported. Any vitals not documented were not able to be obtained and vitals that have been documented are patient reported.  VideoDeclined- This patient declined Librarian, academic. Therefore the visit was completed with audio only.  Persons Participating in Visit: Patient.  AWV Questionnaire: No: Patient  Medicare AWV questionnaire was not completed prior to this visit.  Cardiac Risk Factors include: advanced age (>41men, >76 women);obesity (BMI >30kg/m2);hypertension     Objective:    Today's Vitals   01/21/24 0819  BP: 122/80  Weight: 231 lb (104.8 kg)  Height: 5' 3 (1.6 m)   Body mass index is 40.92 kg/m.     01/21/2024    8:18 AM 08/21/2023    8:04 AM 01/15/2023   10:03 AM 09/25/2022   10:30 AM 11/11/2021    7:55 AM 10/11/2021    8:32 AM 12/23/2020   12:05 PM  Advanced Directives  Does Patient Have a Medical Advance Directive? No No No No No No No  Would patient like information on creating a medical advance directive? No - Patient declined Yes (MAU/Ambulatory/Procedural Areas - Information given) No - Patient declined No - Patient declined  No - Patient declined No - Patient declined    Current Medications (verified) Outpatient Encounter Medications as of 01/21/2024  Medication Sig   ALPRAZolam  (XANAX ) 1 MG tablet Take 1 mg by mouth 5 (five) times daily. Patient may take up to 5 times a day / one in am and 2 in evening   amoxicillin -clavulanate (AUGMENTIN ) 875-125 MG tablet Take 1 tablet by mouth 2 (two) times daily.   amphetamine-dextroamphetamine (ADDERALL) 15 MG tablet Take 2 tablets by mouth daily.   benzonatate  (TESSALON ) 200 MG capsule Take 1 capsule (200 mg total) by mouth 3 (three) times daily as needed for cough.   buPROPion  (WELLBUTRIN  XL) 300 MG 24 hr tablet Take 300 mg by mouth every morning.   Calcium  Carbonate-Vitamin D  500-125 MG-UNIT TABS Take by mouth in the morning and at bedtime.   carbamazepine  (  TEGRETOL ) 200 MG tablet TAKE ONE TABLET BY MOUTH EVERY MORNING AND THREE TABLETS AT BEDTIME   cetirizine (ZYRTEC) 10 MG tablet Take 10 mg by mouth daily.   Cholecalciferol (VITAMIN D3) 50 MCG (2000 UT) capsule Take 2,000 Units by mouth daily.   doxycycline  (PERIOSTAT ) 20 MG tablet TAKE 1 TABLET BY MOUTH TWICE DAILY WITH A MEAL   Dupilumab  (DUPIXENT ) 300 MG/2ML SOAJ  Inject 300 mg into the skin every 14 (fourteen) days. INJECT 1 PEN (300MG ) UNDER THE SKIN (SUBCUTANEOUS INJECTION) EVERY 14 DAYS   FLUoxetine  (PROZAC ) 40 MG capsule Take 40 mg by mouth at bedtime.   lamoTRIgine  (LAMICTAL ) 200 MG tablet 2 (two) times daily.   metoprolol  succinate (TOPROL -XL) 25 MG 24 hr tablet TAKE 1/2 (ONE-HALF) TABLET BY MOUTH ONCE DAILY FOR  HEARTRATE  AND  BLOOD  PRESSURE   metroNIDAZOLE  (METROGEL ) 0.75 % gel APPLY APPLICATION TOPICALLY TWO (2) TIMES DAILY.   Multiple Vitamin (MULTI-VITAMINS) TABS Take by mouth in the morning and at bedtime.   OLANZapine  (ZYPREXA ) 10 MG tablet Take 10 mg by mouth at bedtime.   olanzapine -FLUoxetine  (SYMBYAX ) 12-25 MG per capsule Take 1 capsule by mouth every evening.   omeprazole  (PRILOSEC) 40 MG capsule Take 1 capsule (40 mg total) by mouth daily. For heartburn. Office visit required for further refills.   Probiotic Product (PROBIOTIC DAILY PO) Take by mouth. Takes 2 QD   simvastatin  (ZOCOR ) 10 MG tablet TAKE 1 TABLET BY MOUTH ONCE DAILY FOR CHOLESTEROL   No facility-administered encounter medications on file as of 01/21/2024.    Allergies (verified) Codeine   History: Past Medical History:  Diagnosis Date   Arthritis    osteo   Bipolar 1 disorder (HCC)    Depression    GERD (gastroesophageal reflux disease)    Hypertension    PONV (postoperative nausea and vomiting)    Sleep apnea    CPAP   Past Surgical History:  Procedure Laterality Date   CATARACT EXTRACTION W/PHACO Right 09/11/2022   Procedure: CATARACT EXTRACTION PHACO AND INTRAOCULAR LENS PLACEMENT (IOC) RIGHT  2.97  00:21.1;  Surgeon: Myrna Adine Anes, MD;  Location: [facility];  Service: Ophthalmology;  Laterality: Right;   CATARACT EXTRACTION W/PHACO Left 09/25/2022   Procedure: CATARACT EXTRACTION PHACO AND INTRAOCULAR LENS PLACEMENT (IOC) LEFT  5.35  00:34.4;  Surgeon: Myrna Adine Anes, MD;  Location: [facility];  Service: Ophthalmology;   Laterality: Left;   CERVICAL BIOPSY  W/ LOOP ELECTRODE EXCISION  2016   COLONOSCOPY WITH PROPOFOL  N/A 11/11/2021   Procedure: COLONOSCOPY WITH PROPOFOL ;  Surgeon: Unk Corinn Skiff, MD;  Location: [facility];  Service: Gastroenterology;  Laterality: N/A;   ESOPHAGOGASTRODUODENOSCOPY (EGD) WITH PROPOFOL  N/A 10/27/2019   Procedure: ESOPHAGOGASTRODUODENOSCOPY (EGD) WITH PROPOFOL ;  Surgeon: Janalyn Keene NOVAK, MD;  Location: [facility] ENDOSCOPY;  Service: Endoscopy;  Laterality: N/A;   TUBAL LIGATION     Family History  Problem Relation Age of Onset   Asthma Mother    Depression Daughter    Arthritis Maternal Grandmother    Cancer Maternal Grandmother        lung   Cancer Paternal Grandmother    Leukemia Paternal Grandmother    Breast cancer Neg Hx    Social History   Socioeconomic History   Marital status: Married    Spouse name: Not on file   Number of children: Not on file   Years of education: Not on file   Highest education level: Some college, no degree  Occupational History  Not on file  Tobacco Use   Smoking status: Never   Smokeless tobacco: Never  Vaping Use   Vaping status: Never Used  Substance and Sexual Activity   Alcohol use: No    Comment: Pt denies   Drug use: No    Comment: Pt denies   Sexual activity: Not on file  Other Topics Concern   Not on file  Social History Narrative   Not on file   Social Drivers of Health   Financial Resource Strain: Low Risk  (01/21/2024)   Overall Financial Resource Strain (CARDIA)    Difficulty of Paying Living Expenses: Not hard at all  Food Insecurity: No Food Insecurity (01/21/2024)   Hunger Vital Sign    Worried About Running Out of Food in the Last Year: Never true    Ran Out of Food in the Last Year: Never true  Transportation Needs: No Transportation Needs (01/21/2024)   PRAPARE - Administrator, Civil Service (Medical): No    Lack of Transportation (Non-Medical): No  Physical Activity: Sufficiently  Active (01/21/2024)   Exercise Vital Sign    Days of Exercise per Week: 4 days    Minutes of Exercise per Session: 40 min  Stress: No Stress Concern Present (01/21/2024)   [employer] of Occupational Health - Occupational Stress Questionnaire    Feeling of Stress: Only a little  Social Connections: Socially Integrated (01/21/2024)   Social Connection and Isolation Panel    Frequency of Communication with Friends and Family: More than three times a week    Frequency of Social Gatherings with Friends and Family: Once a week    Attends Religious Services: More than 4 times per year    Active Member of [employer] or Organizations: Yes    Attends Engineer, structural: More than 4 times per year    Marital Status: Married    Tobacco Counseling Counseling given: Not Answered    Clinical Intake:  Pre-visit preparation completed: Yes  Pain : No/denies pain     BMI - recorded: 40.92 Nutritional Status: BMI > 30  Obese Nutritional Risks: None Diabetes: No  Lab Results  Component Value Date   HGBA1C 6.0 07/31/2023   HGBA1C 5.6 07/25/2022   HGBA1C 6.0 07/22/2021     How often do you need to have someone help you when you read instructions, pamphlets, or other written materials from your doctor or pharmacy?: 1 - Never What is the last grade level you completed in school?: 2 years of college  Interpreter Needed?: No  Information entered by :: Katherine Carpenter   Activities of Daily Living     01/21/2024    8:22 AM  In your present state of health, do you have any difficulty performing the following activities:  Hearing? 0  Vision? 0  Difficulty concentrating or making decisions? 0  Walking or climbing stairs? 0  Dressing or bathing? 0  Doing errands, shopping? 0  Preparing Food and eating ? N  Using the Toilet? N  In the past six months, have you accidently leaked urine? N  Do you have problems with loss of bowel control? N  Managing your Medications? N  Managing  your Finances? N  Housekeeping or managing your Housekeeping? N    Patient Care Team: Gretta Comer POUR, NP as PCP - General (Internal Medicine) Maree Jannett POUR, MD as Consulting Physician (Neurology)  I have updated your Care Teams any recent Medical Services you may have received from other  providers in the past year.     Assessment:   This is a routine wellness examination for Katherine Carpenter.  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 1 0 1 0 0  Number falls in past yr: 1 0 0 0 0  Injury with Fall? 1 0 1 0 0  Risk for fall due to : History of fall(s) No Fall Risks History of fall(s) No Fall Risks No Fall Risks  Follow up Falls evaluation completed;Education provided;Falls prevention discussed Falls evaluation completed Falls evaluation completed Falls prevention discussed;Falls evaluation completed Falls evaluation completed    MEDICARE RISK AT HOME:  Medicare Risk at Home Any stairs in or around the home?: Yes If so, are there any without handrails?: No Home free of loose throw rugs in walkways, pet beds, electrical cords, etc?: Yes Adequate lighting in your home to reduce risk of falls?: Yes Life alert?: No Use of a cane, walker or w/c?: No Grab bars in the bathroom?: Yes Shower chair or bench in shower?: Yes Elevated toilet seat or a handicapped toilet?:  Yes  TIMED UP AND GO:  Was the test performed?  No  Cognitive Function: 6CIT completed    07/02/2020    3:57 PM  MMSE - Mini Mental State Exam  Orientation to time 5  Orientation to Place 5  Registration 3  Attention/ Calculation 5  Recall 3  Language- repeat 1        01/21/2024    8:24 AM 01/15/2023   10:05 AM 10/11/2021    8:33 AM  6CIT Screen  What Year? 0 points 0 points 0 points  What month? 0 points 0 points 0 points  What time? 0 points 0 points 0 points  Count back from 20 0 points 2 points 0 points  Months in reverse 0 points 2 points 2 points  Repeat phrase 0 points 2 points 0 points  Total Score 0 points 6 points 2 points    Immunizations Immunization History  Administered Date(s) Administered   Influenza Inj Mdck Quad Pf 03/07/2018   Influenza,inj,Quad PF,6+ Mos 02/01/2019   Influenza-Unspecified 01/30/2017, 03/11/2020, 02/24/2021, 02/25/2022, 02/11/2023   PFIZER(Purple Top)SARS-COV-2 Vaccination 08/29/2019, 09/23/2019, 12/10/2019   Pfizer(Comirnaty)Fall Seasonal Vaccine 12 years and older 03/17/2023   Td 03/17/2023   Tdap 11/28/2012   Zoster Recombinant(Shingrix) 12/03/2017, 04/07/2018    Screening Tests Health Maintenance  Topic Date Due   Pneumococcal Vaccine: 50+ Years (1 of 1 - PCV) Never done   COVID-19 Vaccine (5 -  Pfizer risk 2024-25 season) 09/15/2023   Medicare Annual Wellness (AWV)  01/15/2024   INFLUENZA VACCINE  01/11/2024   MAMMOGRAM  11/20/2024   Cervical Cancer Screening (HPV/Pap Cotest)  07/22/2026   Colonoscopy  11/12/2031   DTaP/Tdap/Td (3 - Td or Tdap) 03/16/2033   Hepatitis C Screening  Completed   HIV Screening  Completed   Zoster Vaccines- Shingrix  Completed   Hepatitis B Vaccines  Aged Out   HPV VACCINES  Aged Out   Meningococcal B Vaccine  Aged Out    Health Maintenance  Health Maintenance Due  Topic Date Due   Pneumococcal Vaccine: 50+ Years (1 of 1 - PCV) Never done   COVID-19 Vaccine (5 - Pfizer risk 2024-25  season) 09/15/2023   Medicare Annual Wellness (AWV)  01/15/2024   INFLUENZA VACCINE  01/11/2024   Health Maintenance Items Addressed:patient states she will have her pharmacy fax   Additional Screening:  Vision Screening: Recommended annual ophthalmology exams for early detection of glaucoma and other disorders of the eye. Would you like a referral to an eye doctor? No    Dental Screening: Recommended annual dental exams for proper oral hygiene  Community Resource Referral / Chronic Care Management: CRR required this visit?  No   CCM required this visit?  No   Plan:    I have personally reviewed and noted the following in the patient's chart:   Medical and social history Use of alcohol, tobacco or illicit drugs  Current medications and supplements including opioid prescriptions. Patient is not currently taking opioid prescriptions. Functional ability and status Nutritional status Physical activity Advanced directives List of other physicians Hospitalizations, surgeries, and ER visits in previous 12 months Vitals Screenings to include cognitive, depression, and falls Referrals and appointments  In addition, I have reviewed and discussed with patient certain preventive protocols, quality metrics, and best practice recommendations. A written personalized care plan for preventive services as well as general preventive health recommendations were provided to patient.   Katherine Carpenter Right, [place]   01/21/2024   After Visit Summary: (MyChart) Due to this being a telephonic visit, the after visit summary with patients personalized plan was offered to patient via MyChart   Notes: Nothing significant to report at this time.

## 2024-01-21 NOTE — Patient Instructions (Signed)
 Ms. Katherine Carpenter , Thank you for taking time out of your busy schedule to complete your Annual Wellness Visit with me. I enjoyed our conversation and look forward to speaking with you again next year. I, as well as your care team,  appreciate your ongoing commitment to your health goals. Please review the following plan we discussed and let me know if I can assist you in the future. Your Game plan/ To Do List    Referrals: If you haven't heard from the office you've been referred to, please reach out to them at the phone provided.   Follow up Visits: We will see or speak with you next year for your Next Medicare AWV with our clinical staff Have you seen your provider in the last 6 months (3 months if uncontrolled diabetes)? Yes  Clinician Recommendations:  Aim for 30 minutes of exercise or brisk walking, 6-8 glasses of water, and 5 servings of fruits and vegetables each day.       This is a list of the screenings recommended for you:  Health Maintenance  Topic Date Due   Pneumococcal Vaccine for age over 47 (1 of 1 - PCV) Never done   COVID-19 Vaccine (5 - Pfizer risk 2024-25 season) 09/15/2023   Medicare Annual Wellness Visit  01/15/2024   Flu Shot  01/11/2024   Mammogram  11/20/2024   Pap with HPV screening  07/22/2026   Colon Cancer Screening  11/12/2031   DTaP/Tdap/Td vaccine (3 - Td or Tdap) 03/16/2033   Hepatitis C Screening  Completed   HIV Screening  Completed   Zoster (Shingles) Vaccine  Completed   Hepatitis B Vaccine  Aged Out   HPV Vaccine  Aged Out   Meningitis B Vaccine  Aged Out    Advanced directives: (Declined) Advance directive discussed with you today. Even though you declined this today, please call our office should you change your mind, and we can give you the proper paperwork for you to fill out. Advance Care Planning is important because it:  [x]  Makes sure you receive the medical care that is consistent with your values, goals, and preferences  [x]  It provides  guidance to your family and loved ones and reduces their decisional burden about whether or not they are making the right decisions based on your wishes.  Follow the link provided in your after visit summary or read over the paperwork we have mailed to you to help you started getting your Advance Directives in place. If you need assistance in completing these, please reach out to us  so that we can help you!  See attachments for Preventive Care and Fall Prevention Tips.

## 2024-03-23 IMAGING — MG MM DIGITAL DIAGNOSTIC UNILAT*R* W/ TOMO W/ CAD
6 series · 6 of 18 positions shown · non-contrast
Comparison: Previous exam(s).

ACR Breast Density Category a: The breast tissue is almost entirely
fatty.

CLINICAL DATA: Callback for RIGHT breast asymmetry.

EXAM:
DIGITAL DIAGNOSTIC UNILATERAL RIGHT MAMMOGRAM WITH TOMOSYNTHESIS AND
CAD
TECHNIQUE: Right digital diagnostic mammography and breast tomosynthesis was
performed. The images were evaluated with computer-aided detection.

[R CC synth-2D (1 of 2)]
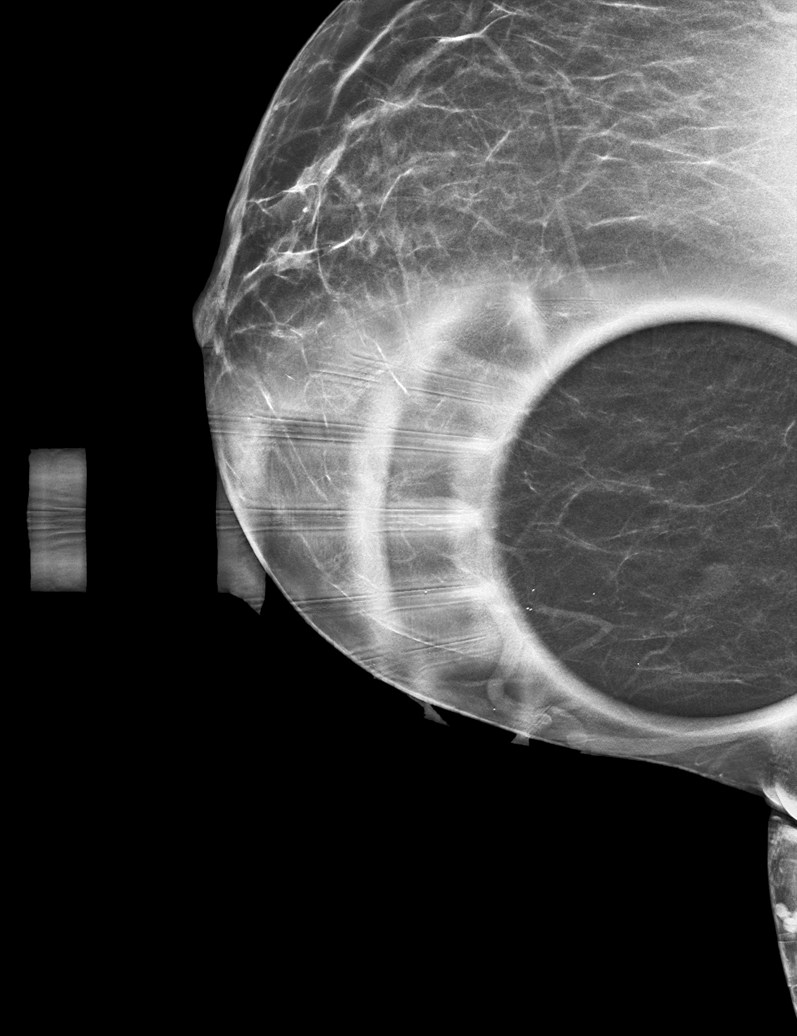

[R MLO synth-2D]
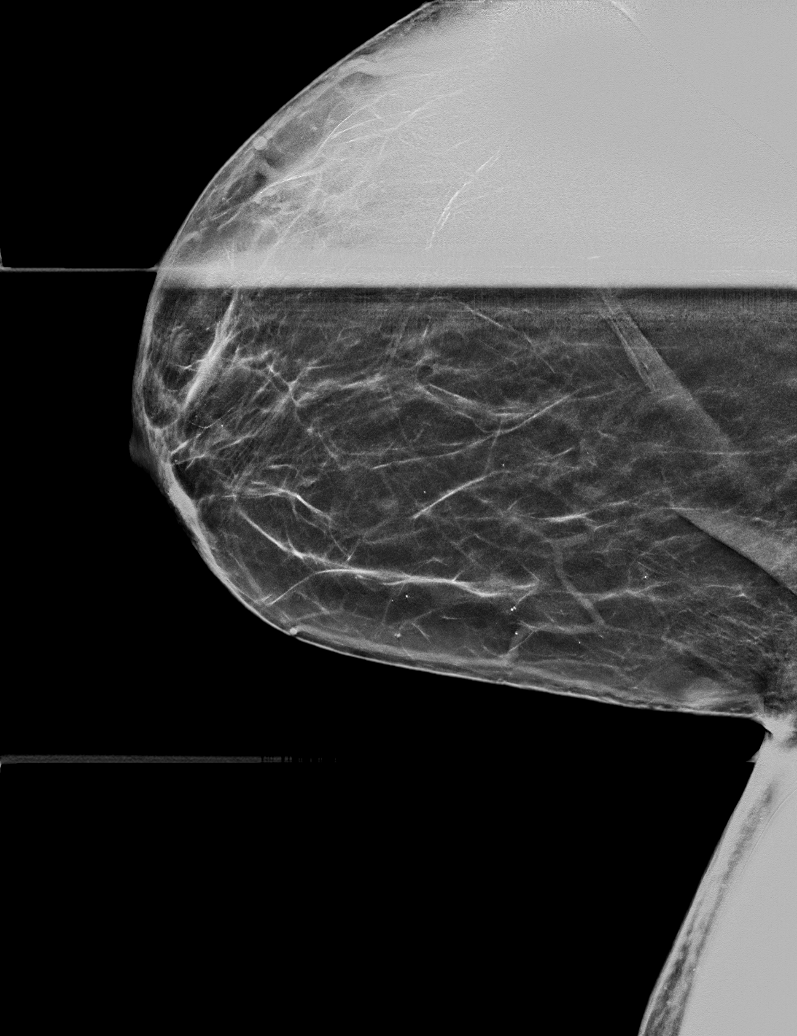

[R CC synth-2D (2 of 2)]
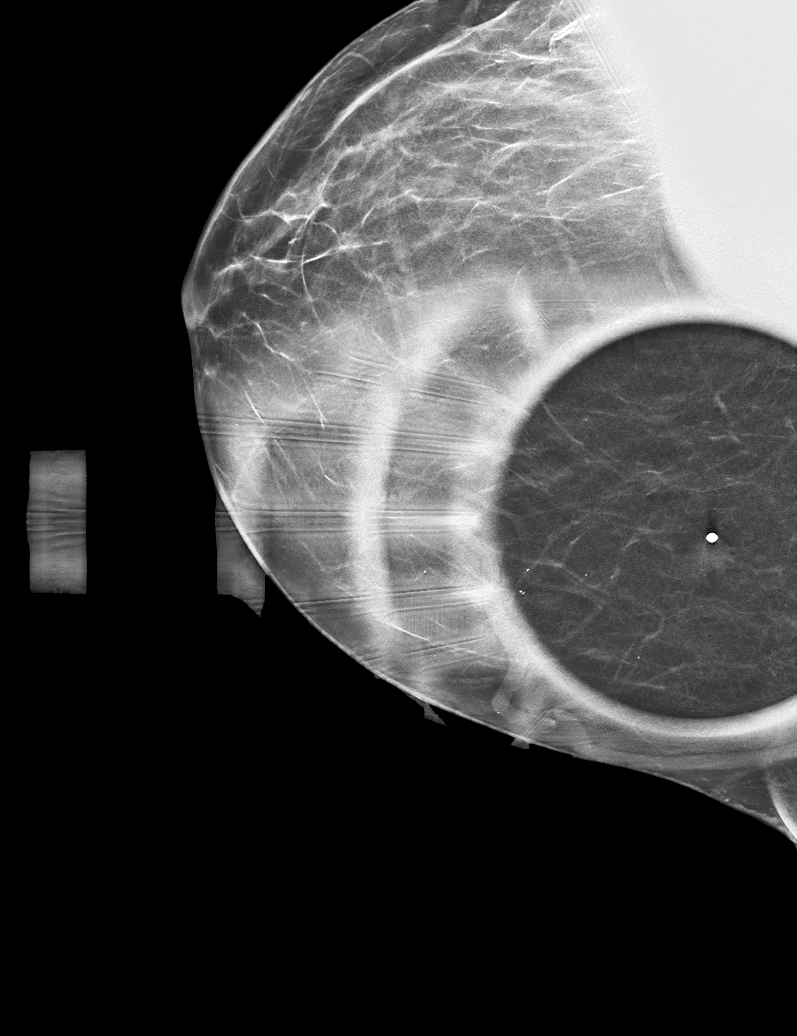

[R MLO tomo · tomo slice 26/51.0]
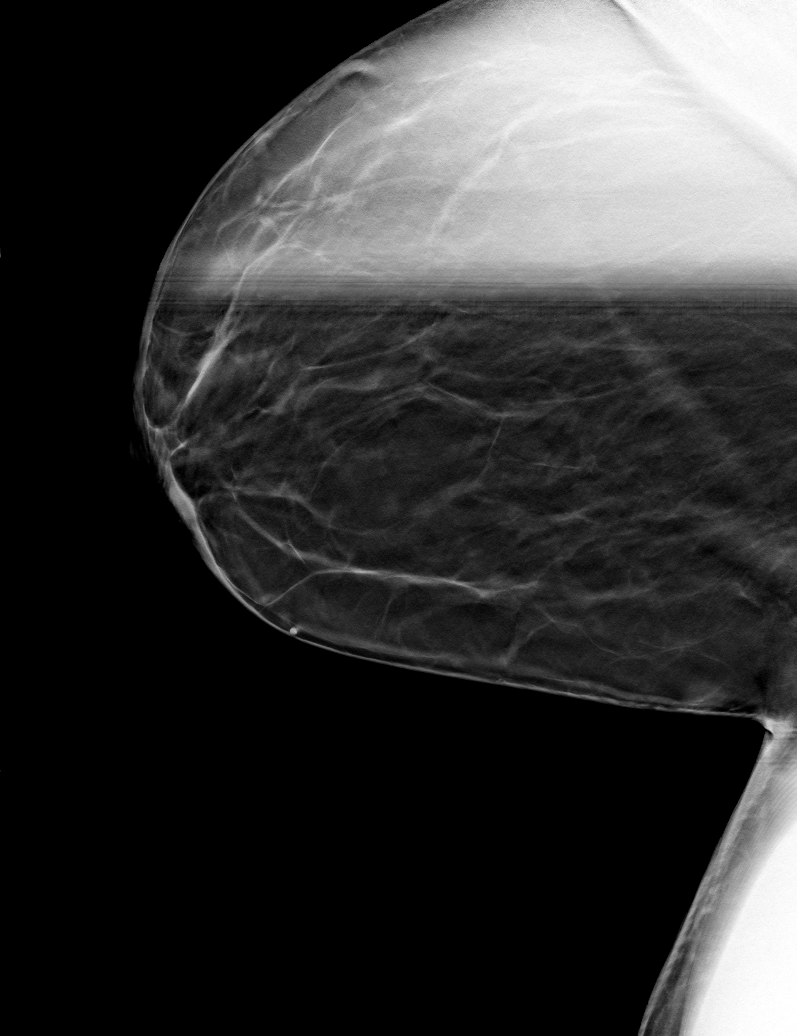

[R CC tomo (1 of 2) · tomo slice 22/43.0]
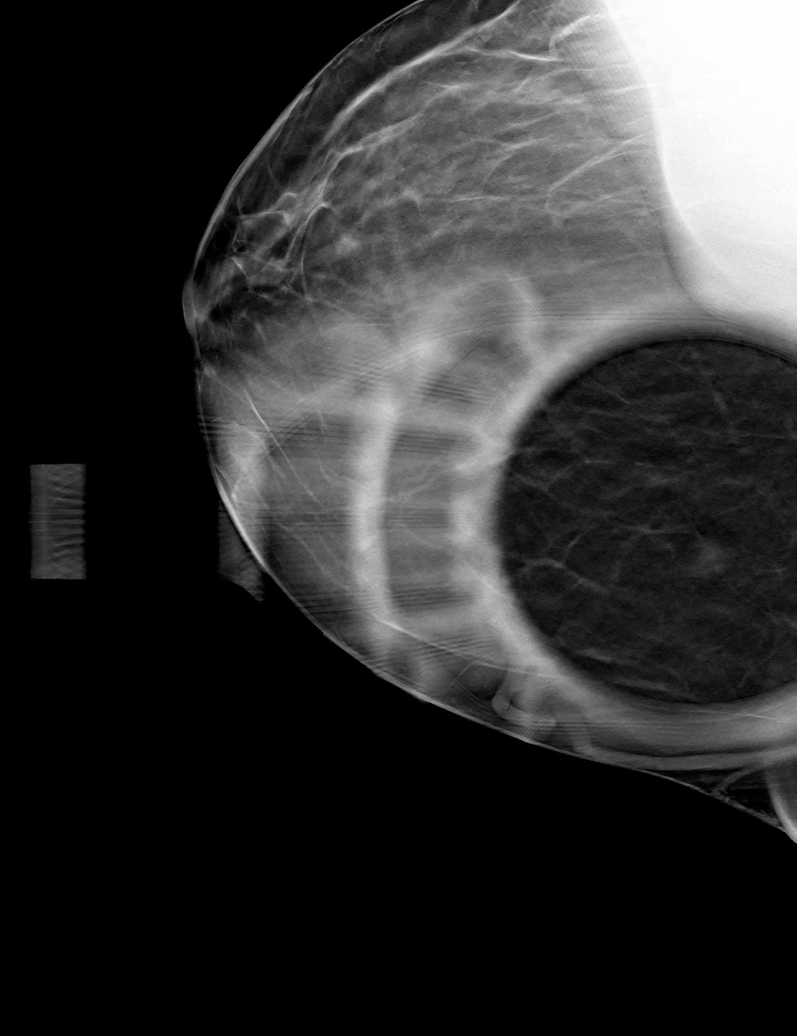

[R CC tomo (2 of 2) · tomo slice 23/44.0]
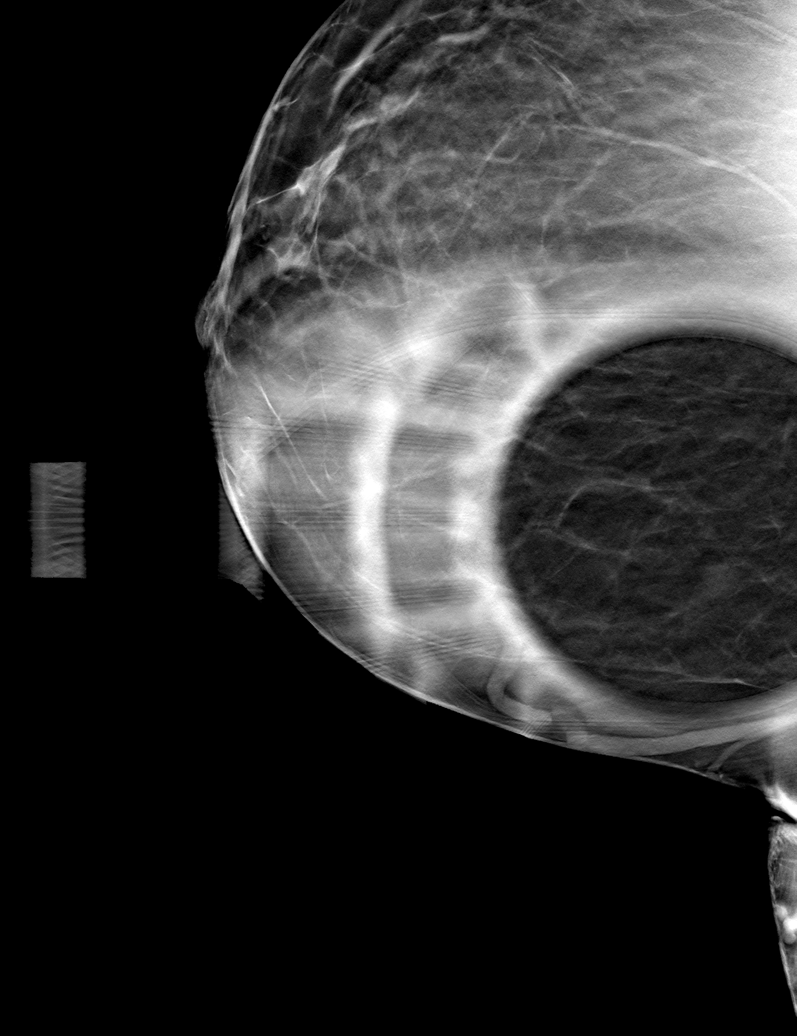

[6 of 18 positions shown; findings below may reference images not displayed]

FINDINGS: Spot compression tomosynthesis views confirm persistence of a
superficial mass in the last slice on spot CC imaging. It is
superficial and fat containing on MLO imaging. Marker was placed on
a superficial palpable intradermal mass. Patient reports this mass
is long standing and has intermittently gotten inflamed in the past.
On physical exam, there is a superficial intradermal mass with a
tract noted to the skin. Repeat imaging with marker in place
demonstrates that the superficial mass noted on physical exam
corresponds to the site of screening mammographic concern.
IMPRESSION: Constellation of findings are most consistent with a benign
epidermal inclusion cyst. If symptomatic relief is desired,
recommend dermatology consultation for excision.

RECOMMENDATION:
Screening mammogram in one year.(Code:[id number])

I have discussed the findings and recommendations with the patient.
If applicable, a reminder letter will be sent to the patient
regarding the next appointment.

BI-RADS CATEGORY  2: Benign.

## 2024-06-04 ENCOUNTER — Other Ambulatory Visit: Payer: Self-pay | Admitting: Dermatology

## 2024-06-04 DIAGNOSIS — L309 Dermatitis, unspecified: Secondary | ICD-10-CM

## 2024-06-15 ENCOUNTER — Other Ambulatory Visit: Payer: Self-pay | Admitting: Dermatology

## 2024-06-15 DIAGNOSIS — L719 Rosacea, unspecified: Secondary | ICD-10-CM

## 2024-06-16 ENCOUNTER — Encounter: Payer: Self-pay | Admitting: Primary Care

## 2024-06-16 DIAGNOSIS — Z1231 Encounter for screening mammogram for malignant neoplasm of breast: Secondary | ICD-10-CM

## 2024-06-17 NOTE — Addendum Note (Signed)
 Addended by: Tevita Gomer K on: 06/17/2024 01:20 PM   Modules accepted: Orders

## 2024-06-23 ENCOUNTER — Encounter

## 2024-06-24 ENCOUNTER — Encounter: Payer: Self-pay | Admitting: Dermatology

## 2024-06-24 ENCOUNTER — Ambulatory Visit: Payer: Medicare HMO | Admitting: Dermatology

## 2024-06-24 DIAGNOSIS — Z79899 Other long term (current) drug therapy: Secondary | ICD-10-CM

## 2024-06-24 DIAGNOSIS — Z7189 Other specified counseling: Secondary | ICD-10-CM

## 2024-06-24 DIAGNOSIS — L719 Rosacea, unspecified: Secondary | ICD-10-CM | POA: Diagnosis not present

## 2024-06-24 DIAGNOSIS — H00011 Hordeolum externum right upper eyelid: Secondary | ICD-10-CM | POA: Diagnosis not present

## 2024-06-24 DIAGNOSIS — L309 Dermatitis, unspecified: Secondary | ICD-10-CM

## 2024-06-24 MED ORDER — DUPIXENT 300 MG/2ML ~~LOC~~ SOAJ: 2.0000 mL | SUBCUTANEOUS | 3 refills | Status: DC

## 2024-06-24 MED ORDER — METRONIDAZOLE 0.75 % EX GEL: CUTANEOUS | 6 refills | Status: AC

## 2024-06-24 NOTE — Patient Instructions (Signed)

## 2024-06-24 NOTE — Progress Notes (Signed)
 "  Follow-Up Visit   Subjective  Katherine Carpenter is a 64 y.o. female who presents for the following: 1 year rosacea/eczema follow up. Patient would like a application for my way to dupixent  and would like a sample because she ran out about 3 weeks ago . She states not having any flares.  She states the doxycycline  and Metrogel  were helpful. Has been getting better and maintaining. Did not get any negative side effects.   The following portions of the chart were reviewed this encounter and updated as appropriate: medications, allergies, medical history  Review of Systems:  No other skin or systemic complaints except as noted in HPI or Assessment and Plan.  Objective  Well appearing patient in no apparent distress; mood and affect are within normal limits.  A focused examination was performed of the following areas: Face  Relevant exam findings are noted in the Assessment and Plan.    Assessment & Plan   ROSACEA Exam:  Mid face erythema with telangiectasias, no inflamed papules   Chronic condition with duration or expected duration over one year. Currently well-controlled.     Rosacea is a chronic progressive skin condition usually affecting the face of adults, causing redness and/or acne bumps. It is treatable but not curable. It sometimes affects the eyes (ocular rosacea) as well. It may respond to topical and/or systemic medication and can flare with stress, sun exposure, alcohol, exercise, topical steroids (including hydrocortisone/cortisone 10) and some foods.  Daily application of broad spectrum spf 30+ sunscreen to face is recommended to reduce flares.   Treatment Plan Continue doxycycline  20 mg tab twice daily with food    Doxycycline  should be taken with food to prevent nausea. Do not lay down for 30 minutes after taking. Be cautious with sun exposure and use good sun protection while on this medication. Pregnant women should not take this medication.      Continue  metronidazole  0.75 % gel - apply topically bid to affected areas of face.        Eczema, well controlled on Dupixent  with no side effects legs  Exam: patient defers exam and reports no lesions   Chronic condition with duration or expected duration over one year. Currently well-controlled.   Has been on Dupixent  after severe disease for 15 years despite multiple other therapies. Patient would like to continue dupixent  therapy.    Continue Dupixent  300mg /40mL every 2 weeks, new prescription sent to [facility] . Sample boxes x2 given: Dupixent  300 mg/2 ml   NDC: 9975-4084-79 Lot: [id number] Exp 01/09/26   Dupilumab  (Dupixent ) is a treatment given by injection for adults and children with moderate-to-severe atopic dermatitis. Goal is control of skin condition, not cure. It is given as 2 injections at the first dose followed by 1 injection ever 2 weeks thereafter.  Young children are dosed monthly.   Potential side effects include allergic reaction, herpes infections, injection site reactions and conjunctivitis (inflammation of the eyes).  The use of Dupixent  requires long term medication management, including periodic office visits.   Long term medication management.  Patient is using long term (months to years) prescription medication  to control their dermatologic condition.  These medications require periodic monitoring to evaluate for efficacy and side effects.   Hordeolum at right eye Exam: erythema edema of R upper eyelid with inflamed papule on mucosal side  Treatment Plan: Patient has follow up with her eye doctor next week at [facility]. Continue warm compresses daily  ROSACEA   This Visit - metroNIDAZOLE  (METROGEL ) 0.75 % gel - APPLY APPLICATION TOPICALLY TWO (2) TIMES DAILY. Existing Treatments - doxycycline  (PERIOSTAT ) 20 MG tablet - TAKE 1 TABLET BY MOUTH TWICE DAILY WITH A MEAL ECZEMA, UNSPECIFIED TYPE   This Visit - Dupilumab  (DUPIXENT )  300 MG/2ML SOAJ - Inject 300 mg into the skin every 14 (fourteen) days. LONG-TERM USE OF HIGH-RISK MEDICATION   COUNSELING AND COORDINATION OF CARE   MEDICATION MANAGEMENT   HORDEOLUM EXTERNUM OF RIGHT UPPER EYELID    Return in about 1 year (around 06/24/2025) for rosacea/eczema follow up w/ Dr. Claudene.  IAlmetta Nora, RMA, am acting as scribe for Boneta Claudene, MD .   Documentation: I have reviewed the above documentation for accuracy and completeness, and I agree with the above.  Boneta Claudene, MD    "

## 2024-06-26 ENCOUNTER — Other Ambulatory Visit: Payer: Self-pay | Admitting: Dermatology

## 2024-06-26 ENCOUNTER — Telehealth: Payer: Self-pay

## 2024-06-26 DIAGNOSIS — L2084 Intrinsic (allergic) eczema: Secondary | ICD-10-CM

## 2024-06-26 MED ORDER — DUPIXENT 300 MG/2ML ~~LOC~~ SOAJ: 2.0000 mL | SUBCUTANEOUS | 3 refills | Status: AC

## 2024-06-26 NOTE — Telephone Encounter (Signed)
 Patient's pharmacy called and states they can no longer cover Dupixent  under current diagnosis and need more specific diagnosis code. They states these are some of the code covered under plan L20.82, L20.84, L20.9 and L20.89  Please advise

## 2024-07-03 ENCOUNTER — Ambulatory Visit: Admitting: Primary Care

## 2024-07-04 ENCOUNTER — Ambulatory Visit (INDEPENDENT_AMBULATORY_CARE_PROVIDER_SITE_OTHER): Admitting: Primary Care

## 2024-07-04 ENCOUNTER — Encounter: Payer: Self-pay | Admitting: Primary Care

## 2024-07-04 VITALS — BP 118/72 | HR 92 | Temp 97.7°F | Ht 63.0 in | Wt 237.0 lb

## 2024-07-04 DIAGNOSIS — E66813 Obesity, class 3: Secondary | ICD-10-CM | POA: Insufficient documentation

## 2024-07-04 DIAGNOSIS — H00011 Hordeolum externum right upper eyelid: Secondary | ICD-10-CM | POA: Diagnosis not present

## 2024-07-04 DIAGNOSIS — Z6841 Body Mass Index (BMI) 40.0 and over, adult: Secondary | ICD-10-CM | POA: Diagnosis not present

## 2024-07-04 MED ORDER — ERYTHROMYCIN 5 MG/GM OP OINT: 1.0000 | TOPICAL_OINTMENT | Freq: Every day | OPHTHALMIC | 0 refills | Status: AC

## 2024-07-04 NOTE — Progress Notes (Signed)
 "  Subjective:    Patient ID: Katherine Carpenter, female    DOB: [birth year], 64 y.o.   MRN: [id number]  Katherine Carpenter is a very pleasant 64 y.o. female with a history of hypertension, eczema, hyperlipidemia, prediabetes who presents today to discuss swollen eyelid. She is also wanting a referral to healthy weight and wellness.   Symptom onset 2 weeks ago with right upper eyelid swelling and discomfort. She's also experienced waking with her eyelids matted together. Over the last week her swelling has reduced slightly reduced. She continues to notice tenderness to the eyelid, erythema, slight itching. She has noticed more floaters to her right eye since. She's not applied anything OTC.   She struggles with controlling food cravings including sweets, breads, pasta. She eat ice cream each night before bed. 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 She admits to stress eating.   Wt Readings from Last 3 Encounters:  07/04/24 237 lb (107.5 kg)  01/21/24 231 lb (104.8 kg)  10/17/23 226 lb (102.5 kg)   Body mass index is 41.98 kg/m.    Review of Systems  Constitutional:  Negative for fever.  HENT:  Negative for ear pain and sinus pain.   Eyes:  Positive for discharge and visual disturbance. Negative for pain and itching.       Eyelid swelling         Past Medical History:  Diagnosis Date   Arthritis    osteo   Bipolar 1 disorder (HCC)    Depression    GERD (gastroesophageal reflux disease)    Hypertension    PONV (postoperative nausea and vomiting)    Sleep apnea    CPAP    Social History   Socioeconomic History   Marital status: Married    Spouse name: Not on file   Number of children: Not on file   Years of education: Not on file   Highest education level: Associate degree: academic program  Occupational History   Not on file  Tobacco Use   Smoking status: Never   Smokeless tobacco: Never  Vaping Use   Vaping  status: Never Used  Substance and Sexual Activity   Alcohol use: No    Comment: Pt denies   Drug use: No    Comment: Pt denies   Sexual activity: Not on file  Other Topics Concern   Not on file  Social History Narrative   Not on file   Social Drivers of Health   Tobacco Use: Low Risk (07/04/2024)   Patient History    Smoking Tobacco Use: Never    Smokeless Tobacco Use: Never    Passive Exposure: Not on file  Financial Resource Strain: Low Risk (07/02/2024)   Overall Financial Resource Strain (CARDIA)    Difficulty of Paying Living Expenses: Not hard at all  Food Insecurity: No Food Insecurity (07/02/2024)   Epic    Worried About Radiation Protection Practitioner of Food in the Last Year: Never true    Ran Out of Food in the Last Year: Never true  Transportation Needs: No Transportation Needs (07/02/2024)   Epic    Lack of Transportation (Medical): No    Lack of Transportation (Non-Medical): No  Physical Activity: Insufficiently Active (07/02/2024)   Exercise Vital Sign    Days of Exercise per Week: 3 days    Minutes of Exercise per Session: 40 min  Stress: No Stress Concern Present (07/02/2024)   [employer] of  Occupational Health - Occupational Stress Questionnaire    Feeling of Stress: Not at all  Social Connections: Socially Integrated (07/02/2024)   Social Connection and Isolation Panel    Frequency of Communication with Friends and Family: More than three times a week    Frequency of Social Gatherings with Friends and Family: More than three times a week    Attends Religious Services: More than 4 times per year    Active Member of Clubs or Organizations: Yes    Attends Banker Meetings: More than 4 times per year    Marital Status: Married  Catering Manager Violence: Not At Risk (01/21/2024)   Epic    Fear of Current or Ex-Partner: No    Emotionally Abused: No    Physically Abused: No    Sexually Abused: No  Depression (PHQ2-9): Low Risk (07/04/2024)   Depression (PHQ2-9)     PHQ-2 Score: 1  Alcohol Screen: Low Risk (01/21/2024)   Alcohol Screen    Last Alcohol Screening Score (AUDIT): 0  Housing: Low Risk (07/02/2024)   Epic    Unable to Pay for Housing in the Last Year: No    Number of Times Moved in the Last Year: 0    Homeless in the Last Year: No  Utilities: Not At Risk (01/21/2024)   Epic    Threatened with loss of utilities: No  Health Literacy: Adequate Health Literacy (01/15/2023)   B1300 Health Literacy    Frequency of need for help with medical instructions: Never    Past Surgical History:  Procedure Laterality Date   CATARACT EXTRACTION W/PHACO Right 09/11/2022   Procedure: CATARACT EXTRACTION PHACO AND INTRAOCULAR LENS PLACEMENT (IOC) RIGHT  2.97  00:21.1;  Surgeon: Myrna Adine Anes, MD;  Location: [facility];  Service: Ophthalmology;  Laterality: Right;   CATARACT EXTRACTION W/PHACO Left 09/25/2022   Procedure: CATARACT EXTRACTION PHACO AND INTRAOCULAR LENS PLACEMENT (IOC) LEFT  5.35  00:34.4;  Surgeon: Myrna Adine Anes, MD;  Location: [facility];  Service: Ophthalmology;  Laterality: Left;   CERVICAL BIOPSY  W/ LOOP ELECTRODE EXCISION  2016   COLONOSCOPY WITH PROPOFOL  N/A 11/11/2021   Procedure: COLONOSCOPY WITH PROPOFOL ;  Surgeon: Unk Corinn Skiff, MD;  Location: [facility];  Service: Gastroenterology;  Laterality: N/A;   ESOPHAGOGASTRODUODENOSCOPY (EGD) WITH PROPOFOL  N/A 10/27/2019   Procedure: ESOPHAGOGASTRODUODENOSCOPY (EGD) WITH PROPOFOL ;  Surgeon: Janalyn Keene NOVAK, MD;  Location: [facility] END[facility];  Service: Endoscopy;  Laterality: N/A;   TUBAL LIGATION      Family History  Problem Relation Age of Onset   Asthma Mother    Depression Daughter    Arthritis Maternal Grandmother    Cancer Maternal Grandmother        lung   Cancer Paternal Grandmother    Leukemia Paternal Grandmother    Breast cancer Neg Hx     Allergies[1]  Medications Ordered Prior to Encounter[2]  BP 118/72   Pulse 92   Temp 97.7  F (36.5 C) (Oral)   Ht 5' 3 (1.6 m)   Wt 237 lb (107.5 kg)   LMP 09/08/2011   SpO2 94%   BMI 41.98 kg/m  Objective:   Physical Exam Eyes:     General:        Right eye: Hordeolum present. No discharge.        Left eye: No discharge or hordeolum.     Comments: Mild to moderate swelling to right upper eyelid with erythema.   Cardiovascular:  Rate and Rhythm: Normal rate.  Pulmonary:     Effort: Pulmonary effort is normal.  Skin:    General: Skin is warm and dry.     Physical Exam        Assessment & Plan:  Hordeolum externum of right upper eyelid Assessment & Plan: Exam today representative.  Given duration of treatment, coupled with lack of improvement, will treat.  Start erythromycin  ointment HS. Home instructions provided.   Orders: -     Erythromycin ; Place 1 Application into the right eye at bedtime.  Dispense: 3.5 g; Refill: 0  Class 3 severe obesity due to excess calories without serious comorbidity with body mass index (BMI) of 40.0 to 44.9 in adult [facility]) Assessment & Plan: Encouraged patient to work on stress eating and limit ice cream at bedtime.  Referral placed for healthy weight and wellness  Orders: -     Amb Ref to Medical Weight Management    Assessment and Plan Assessment & Plan         Katherine MARLA Gaskins, NP       [1]  Allergies Allergen Reactions   Codeine Other (See Comments)  [2]  Current Outpatient Medications on File Prior to Visit  Medication Sig Dispense Refill   ALPRAZolam  (XANAX ) 1 MG tablet Take 1 mg by mouth 5 (five) times daily. Patient may take up to 5 times a day / one in am and 2 in evening     amphetamine-dextroamphetamine (ADDERALL) 15 MG tablet Take 2 tablets by mouth daily.     buPROPion  (WELLBUTRIN  XL) 300 MG 24 hr tablet Take 300 mg by mouth every morning.     Calcium  Carbonate-Vitamin D  500-125 MG-UNIT TABS Take by mouth in the morning and at bedtime.     carbamazepine  (TEGRETOL ) 200 MG tablet  TAKE ONE TABLET BY MOUTH EVERY MORNING AND THREE TABLETS AT BEDTIME     cetirizine (ZYRTEC) 10 MG tablet Take 10 mg by mouth daily.     Cholecalciferol (VITAMIN D3) 50 MCG (2000 UT) capsule Take 2,000 Units by mouth daily.     doxycycline  (PERIOSTAT ) 20 MG tablet TAKE 1 TABLET BY MOUTH TWICE DAILY WITH A MEAL 60 tablet 0   Dupilumab  (DUPIXENT ) 300 MG/2ML SOAJ Inject 300 mg into the skin every 14 (fourteen) days. 12 mL 3   FLUoxetine  (PROZAC ) 40 MG capsule Take 40 mg by mouth at bedtime.     lamoTRIgine  (LAMICTAL ) 200 MG tablet 2 (two) times daily.     metoprolol  succinate (TOPROL -XL) 25 MG 24 hr tablet TAKE 1/2 (ONE-HALF) TABLET BY MOUTH ONCE DAILY FOR  HEARTRATE  AND  BLOOD  PRESSURE 45 tablet 2   metroNIDAZOLE  (METROGEL ) 0.75 % gel APPLY APPLICATION TOPICALLY TWO (2) TIMES DAILY. 45 g 6   Multiple Vitamin (MULTI-VITAMINS) TABS Take by mouth in the morning and at bedtime.     OLANZapine  (ZYPREXA ) 10 MG tablet Take 10 mg by mouth at bedtime.     olanzapine -FLUoxetine  (SYMBYAX ) 12-25 MG per capsule Take 1 capsule by mouth every evening.     omeprazole  (PRILOSEC) 40 MG capsule Take 1 capsule (40 mg total) by mouth daily. For heartburn. Office visit required for further refills. 90 capsule 0   Probiotic Product (PROBIOTIC DAILY PO) Take by mouth. Takes 2 QD     simvastatin  (ZOCOR ) 10 MG tablet TAKE 1 TABLET BY MOUTH ONCE DAILY FOR CHOLESTEROL 90 tablet 3   No current facility-administered medications on file prior to visit.   "

## 2024-07-04 NOTE — Assessment & Plan Note (Signed)
 Encouraged patient to work on stress eating and limit ice cream at bedtime.  Referral placed for healthy weight and wellness

## 2024-07-04 NOTE — Patient Instructions (Signed)
 Apply the erythromycin  antibiotic ointment to your upper eyelid at night.  Continue warm compresses as needed.  You will either be contacted via phone regarding your referral to healthy weight and wellness in [place], or you may receive a letter on your MyChart portal from our referral team with instructions for scheduling an appointment. Please let us  know if you have not been contacted by anyone within two weeks.  It was a pleasure to see you today!

## 2024-07-04 NOTE — Assessment & Plan Note (Signed)
 Exam today representative.  Given duration of treatment, coupled with lack of improvement, will treat.  Start erythromycin  ointment HS. Home instructions provided.

## 2024-07-16 ENCOUNTER — Encounter (INDEPENDENT_AMBULATORY_CARE_PROVIDER_SITE_OTHER): Payer: Self-pay

## 2024-07-18 ENCOUNTER — Encounter

## 2024-08-08 ENCOUNTER — Encounter

## 2025-01-26 ENCOUNTER — Ambulatory Visit

## 2025-06-25 ENCOUNTER — Ambulatory Visit: Admitting: Dermatology
